# Patient Record
Sex: Male | Born: 1965
Health system: Southern US, Community
[De-identification: ages and names within clinical notes are randomized; demographics above are authoritative.]

## PROBLEM LIST (undated history)

## (undated) DIAGNOSIS — I10 Essential (primary) hypertension: Secondary | ICD-10-CM

## (undated) DIAGNOSIS — G4730 Sleep apnea, unspecified: Secondary | ICD-10-CM

## (undated) DIAGNOSIS — E039 Hypothyroidism, unspecified: Secondary | ICD-10-CM

## (undated) DIAGNOSIS — M1990 Unspecified osteoarthritis, unspecified site: Secondary | ICD-10-CM

## (undated) DIAGNOSIS — K5730 Diverticulosis of large intestine without perforation or abscess without bleeding: Secondary | ICD-10-CM

## (undated) DIAGNOSIS — R7303 Prediabetes: Secondary | ICD-10-CM

## (undated) DIAGNOSIS — Z9889 Other specified postprocedural states: Secondary | ICD-10-CM

## (undated) DIAGNOSIS — E785 Hyperlipidemia, unspecified: Secondary | ICD-10-CM

## (undated) HISTORY — PX: TONSILLECTOMY: SUR1361

## (undated) HISTORY — DX: Hyperlipidemia, unspecified: E78.5

## (undated) HISTORY — PX: BACK SURGERY: SHX140

---

## 2005-08-22 ENCOUNTER — Ambulatory Visit: Payer: Self-pay | Admitting: Family Medicine

## 2005-11-13 ENCOUNTER — Ambulatory Visit: Payer: Self-pay | Admitting: Family Medicine

## 2005-12-06 ENCOUNTER — Ambulatory Visit: Payer: Self-pay | Admitting: Family Medicine

## 2006-01-22 ENCOUNTER — Inpatient Hospital Stay (HOSPITAL_COMMUNITY): Admission: AD | Admit: 2006-01-22 | Discharge: 2006-01-27 | Payer: Self-pay | Admitting: Neurosurgery

## 2006-11-12 ENCOUNTER — Ambulatory Visit: Payer: Self-pay | Admitting: Family Medicine

## 2007-04-02 ENCOUNTER — Ambulatory Visit: Payer: Self-pay | Admitting: Family Medicine

## 2007-04-22 ENCOUNTER — Ambulatory Visit: Payer: Self-pay | Admitting: Family Medicine

## 2007-05-28 ENCOUNTER — Ambulatory Visit: Payer: Self-pay | Admitting: Family Medicine

## 2010-07-12 ENCOUNTER — Ambulatory Visit: Payer: Self-pay | Admitting: Diagnostic Radiology

## 2010-07-12 ENCOUNTER — Emergency Department (HOSPITAL_BASED_OUTPATIENT_CLINIC_OR_DEPARTMENT_OTHER): Admission: EM | Admit: 2010-07-12 | Discharge: 2010-07-12 | Payer: Self-pay | Admitting: Emergency Medicine

## 2011-03-04 LAB — D-DIMER, QUANTITATIVE (NOT AT ARMC): D-Dimer, Quant: <0.22 ug/mL-FEU (ref 0.00–0.48)

## 2011-03-04 LAB — DIFFERENTIAL
Basophils Absolute: 0.4 10*3/uL — ABNORMAL HIGH (ref 0.0–0.1)
Basophils Relative: 3 % — ABNORMAL HIGH (ref 0–1)
Eosinophils Absolute: 0.2 10*3/uL (ref 0.0–0.7)
Eosinophils Relative: 1 % (ref 0–5)
Lymphocytes Relative: 12 % (ref 12–46)
Lymphs Abs: 1.6 10*3/uL (ref 0.7–4.0)
Monocytes Absolute: 1 10*3/uL (ref 0.1–1.0)
Monocytes Relative: 7 % (ref 3–12)
Neutro Abs: 10.8 10*3/uL — ABNORMAL HIGH (ref 1.7–7.7)
Neutrophils Relative %: 77 % (ref 43–77)

## 2011-03-04 LAB — POCT CARDIAC MARKERS
CKMB, poc: <1 ng/mL — ABNORMAL LOW (ref 1.0–8.0)
Myoglobin, poc: 79 ng/mL (ref 12–200)
Troponin i, poc: <0.05 ng/mL (ref 0.00–0.09)

## 2011-03-04 LAB — BASIC METABOLIC PANEL
BUN: 13 mg/dL (ref 6–23)
CO2: 32 mEq/L (ref 19–32)
Calcium: 9.3 mg/dL (ref 8.4–10.5)
Chloride: 98 mEq/L (ref 96–112)
Creatinine, Ser: 1.4 mg/dL (ref 0.4–1.5)
GFR calc Af Amer: >60 mL/min (ref >60)
GFR calc non Af Amer: 56 mL/min — ABNORMAL LOW (ref >60)
Glucose, Bld: 72 mg/dL (ref 70–99)
Potassium: 4 mEq/L (ref 3.5–5.1)
Sodium: 141 mEq/L (ref 135–145)

## 2011-03-04 LAB — CBC
HCT: 53.6 % — ABNORMAL HIGH (ref 39.0–52.0)
Hemoglobin: 17.8 g/dL — ABNORMAL HIGH (ref 13.0–17.0)
MCH: 29.2 pg (ref 26.0–34.0)
MCHC: 33.2 g/dL (ref 30.0–36.0)
MCV: 87.9 fL (ref 78.0–100.0)
Platelets: 241 10*3/uL (ref 150–400)
RBC: 6.09 MIL/uL — ABNORMAL HIGH (ref 4.22–5.81)
RDW: 13.7 % (ref 11.5–15.5)
WBC: 13.9 10*3/uL — ABNORMAL HIGH (ref 4.0–10.5)

## 2011-05-05 NOTE — H&P (Signed)
James [name], OSORTO NO.:  [id number]   MEDICAL RECORD NO.:  [id number]          PATIENT TYPE:  INP   LOCATION:  3009                         FACILITY:  [facility]   PHYSICIAN:  Hewitt Shorts, M.D.DATE OF BIRTH:  22-Oct-1966   DATE OF ADMISSION:  01/22/2006  DATE OF DISCHARGE:                                HISTORY & PHYSICAL   HISTORY OF PRESENT ILLNESS:  Patient is a 45 year old left-handed white male  who is seen regarding left lumbar radiculopathy related to his L5-S1 lumbar  disk herniation.  Patient difficulties with his back began about five or six  years ago with what felt like a catch in his back.  It lasted about a day.  He then had occasional sharp pains from his back down to his left testicle.  The pain at this point developed about two and a half months ago with pain  from the low back extending into the left buttock, posterior left thigh with  numbness and tingling in the left leg and foot.  Pain has been worsening  into the low back and left lower extremity.   Patient was treated with a prednisone dose pack.  He has actually been  treated with a total of three steroid dose packs.  Each times the symptoms  improved but as the dose pack tapers and discontinues the pain recurs.  He  has also been treated with Darvocet and Skelaxin.  Because of persistent  symptoms MRI scan was obtained and neurosurgery consultation requested.  The  MRI scan revealed multilevel degenerative disk disease and spondylosis;  however, the most significant finding was that of a large L5-S1 lumbar disk  herniation with significant thecal sac and left S1 nerve root compression.   The patient is admitted now for a left L5-S1 laminotomy and microdiskectomy.   PAST MEDICAL HISTORY:  Notable for history of sleep apnea and hypertension  both resolved, though, with 50 pounds of weight loss from 2005 to 2006.  He  does not describe any history of myocardial infarction, cancer,  stroke,  peptic ulcer disease, or lung disease.   PAST SURGICAL HISTORY:  Tonsillectomy and ear tubes as a child, but no  recent surgeries as an adult.   He denies allergies to medications.   CURRENT MEDICATIONS:  Darvocet and Skelaxin.   FAMILY HISTORY:  His mother died age 33.  She had COPD.  Father is age 20,  he has Alzheimer's.   SOCIAL HISTORY:  Patient is a Emergency planning/management officer at [employer].  He  is unmarried.  He does not smoke, drink alcoholic beverages, or have a  history of substance abuse.   REVIEW OF SYSTEMS:  Notable for those described in his history of present  illness, past medical history but his 14-point review of systems is  otherwise unremarkable.   PHYSICAL EXAMINATION:  GENERAL:  Patient is a well-developed, well-nourished  white male in no acute distress.  VITAL SIGNS:  Temperature 97.1, pulse 87, blood pressure 138/83, respiratory  rate 16, height 6 feet 1 inch, weight 231 pounds.  LUNGS:  Clear to  auscultation.  He has symmetrical respiratory excursion.  HEART:  Regular rhythm.  Normal S1, S2.  There is no murmur.  ABDOMEN:  Soft, nondistended.  Bowel sounds are present.  EXTREMITIES:  No clubbing, cyanosis, edema.  MUSCULOSKELETAL:  Positive straight leg raising on the left, negative  straight leg raising on the right.  NEUROLOGIC:  5/5 strength to the lower extremities including iliopsoas,  quadriceps, dorsiflexor, extensor hallux longus, and plantar flexor  bilaterally.  Sensation is intact to pin prick to the lower extremities.  Reflexes are 1-2 at the quadriceps, 2 at the gastrocnemii and symmetrical  bilaterally.  Toes are downgoing bilaterally.  He has a normal gait and  stance.   IMPRESSION:  Left lumbar radiculopathy related to a large left L5-S1 disk  herniation with underlying degenerative disk disease with spondylosis.   PLAN:  The patient will be admitted for a left L5-S1 lumbar laminotomy and  microdiskectomy.  We discussed  alternatives to surgery including continued  symptomatic treatment with medications, possibility of considering spinal  injections, and decompression disk surgery.  We discussed the nature of  surgery, alternative to surgery, hospital stay, and overall recuperations,  limitations postoperatively, and risks including risks of infection,  bleeding, possible need for transfusion, the risk of nerve root dysfunction,  pain, weakness, numbness, paraesthesias, the risk of dural tear and CSF  leak, possible need for further surgery, the risk of recurrent disk  herniation and possible need for further surgery, and anesthetic risks and  myocardial infarction, stroke, pneumonia, and death.  After discussing all  of this he would like to go ahead with surgery and would like to go ahead  with such.      Hewitt Shorts, M.D.  Electronically Signed     RWN/MEDQ  D:  01/25/2006  T:  01/25/2006  Job:  [id number]

## 2011-05-05 NOTE — Op Note (Signed)
NAMESIGIFREDO, PIGNATO                ACCOUNT NO.:  [id number]   MEDICAL RECORD NO.:  [id number]          PATIENT TYPE:  OIB   LOCATION:  3172                         FACILITY:  [facility]   PHYSICIAN:  Hewitt Shorts, M.D.DATE OF BIRTH:  November 21, 1966   DATE OF PROCEDURE:  01/22/2006  DATE OF DISCHARGE:                                 OPERATIVE REPORT   PREOPERATIVE DIAGNOSIS:  Left L5-S1 lumbar disk herniation, lumbar  spondylosis, lumbar degenerative disk disease and lumbar radiculopathy.   POSTOPERATIVE DIAGNOSIS:  Left L5-S1 lumbar disk herniation, lumbar  spondylosis, lumbar degenerative disk disease and lumbar radiculopathy.   PROCEDURE:  Left L5-S1 lumbar laminotomy and microdiskectomy with  microdissection.   SURGEON:  Hewitt Shorts, M.D.   ASSISTANT:  Hilda Lias, M.D.   ANESTHESIA:  General orotracheal.   INDICATION:  This is a 45 year old man who presented with a left lumbar  radiculopathy and was found to have a large left L5-S1 lumbar disk  herniation.  Decision was made to proceed with elective laminotomy and  microdiskectomy.   DESCRIPTION OF PROCEDURE:  The patient was brought to the operating room and  placed under general orotracheal anesthesia.  The patient was turned to a  prone position and lumbar region was prepped with Betadine soaping solution  and draped in a sterile fashion.  The midline was infiltrated with a local  anesthetic with epinephrine and x-ray was taken, the L5-S1 level identified  and a midline incision was made over the L5-S1 level and carried down  through the subcutaneous tissue.  Bipolar cautery and electrocautery were  used to maintain hemostasis.  Dissection was carried down to the lumbar  fascia, which was incised on the left side of the midline, and the  paraspinal muscles were dissected from the spinous process and lamina in a  subperiosteal fashion.  The L5-S1 interlaminar space was identified and an x-  ray was taken to  confirm the localization and then the microscope was draped  and brought into the field to provide additional magnification, illumination  and visualization, and the remainder of the decompression was performed  using microdissection and microsurgical technique.  A laminotomy was  performed using the XMax drill and Kerrison punches.  The ligamentum flavum  was carefully removed and thecal sac and nerve roots were identified.  However, it was difficult to mobilize the thecal sac and nerve roots because  of epidural adhesions and as this was done, a small rent occurred in the  lateral aspect of the nerve root and thecal sac and this was repaired with 2  interrupted 6-0 Prolene sutures.  The dissection was continued through the  epidural space and we were eventually able to identify the annulus.  The  annulus was incised and we entered the disk space and proceeded with the  diskectomy using a variety of pituitary rongeurs and microcurettes.  These  was significant spondylitic disk herniation with superimposed acute disk  herniation and much of the spondylitic disk herniation was removed as well  as the acute disk herniation and we were able to decompress the thecal sac  and nerve roots.  In the end, good decompression of the neural structures  was achieved.  All loose fragments of disk material were removed from both  the disk space and the epidural space.  After the diskectomy was completed  and the repair of the dura had been completed, the repair was examined with  a Valsalva maneuver and no CSF leakage was noted.  We then injected Tisseel  over the repair and then proceeded with closure.  The deep fascia was closed  with interrupted undyed 1 Vicryl suture, Scarpa's fascia was closed with  interrupted undyed 1 Vicryl sutures, the subcutaneous and subcuticular were  closed with interrupted and inverted 2-0 undyed Vicryl sutures and the skin  was closed with a running locking 3-0 nylon suture.   The wound was dressed  with Adaptic and sterile gauze.  Following surgery, the patient was turned  to a supine position.  A Foley catheter is to be placed and once that is  placed, the patient will be reversed from the anesthetic, extubated and  transferred to the recovery room for further care with his head of bed flat.      Hewitt Shorts, M.D.  Electronically Signed     RWN/MEDQ  D:  01/22/2006  T:  01/23/2006  Job:  [id number]

## 2011-05-05 NOTE — Discharge Summary (Signed)
NAMESTRAN, RAPER NO.:  [id number]   MEDICAL RECORD NO.:  [id number]          PATIENT TYPE:  INP   LOCATION:  3009                         FACILITY:  [facility]   PHYSICIAN:  Cristi Loron, M.D.DATE OF BIRTH:  16-Dec-1966   DATE OF ADMISSION:  01/22/2006  DATE OF DISCHARGE:  01/27/2006                                 DISCHARGE SUMMARY   BRIEF HISTORY:  The patient is a 45 year old white male who presented with  left lumbar radiculopathy and was found to have a large left L5 herniated  disc.  Decision was made to proceed with elective laminectomy and  microdiskectomy.  For further details of this admission, please refer to the  typed history and physical.   HOSPITAL COURSE:  Dr. Newell Coral admitted the patient on January 22, 2006.  On  the day of admission he performed an L5-S1 microdiskectomy.  The surgery was  complicated by a dural tear which he repaired primarily.  (For further  details of the operation, please refer to the typed operative report).   The patient's postoperative course was unremarkable.  We kept him at flat  bedrest for a few days and then allowed him to get up.  By January 27, 2006  the patient was afebrile, vital signs were stable, he was eating well and  ambulating well and he had no signs of headaches or CSF leaks.  He was  requesting discharge home and he was therefore discharged home on January 27, 2006.   FINAL DIAGNOSES:  1.  L5-S1 herniated nucleus pulposus, lumbar spondylosis, lumbar      degenerative disease, lumbar radiculopathy.  2.  Dural tear.   PROCEDURE PERFORMED:  Left L5-S1 laminectomy and microdiskectomy with  microdissection.   DISCHARGE INSTRUCTIONS:  Patient was given written discharge instructions  and is instructed to follow up with Dr. Newell Coral.   DISCHARGE PRESCRIPTIONS:  1.  Percocet 10/325 #50, one p.o. q.4h. p.r.n. pain.  2.  Flexeril 10 mg #50, one p.o. q.8h. p.r.n. for muscle spasms.      Cristi Loron, M.D.  Electronically Signed     JDJ/MEDQ  D:  03/29/2006  T:  03/29/2006  Job:  [id number]

## 2014-05-20 ENCOUNTER — Emergency Department (HOSPITAL_BASED_OUTPATIENT_CLINIC_OR_DEPARTMENT_OTHER): Payer: BC Managed Care – PPO

## 2014-05-20 ENCOUNTER — Encounter (HOSPITAL_BASED_OUTPATIENT_CLINIC_OR_DEPARTMENT_OTHER): Payer: Self-pay | Admitting: Emergency Medicine

## 2014-05-20 ENCOUNTER — Emergency Department (HOSPITAL_BASED_OUTPATIENT_CLINIC_OR_DEPARTMENT_OTHER)
Admission: EM | Admit: 2014-05-20 | Discharge: 2014-05-20 | Disposition: A | Discharge status: Home / Self Care | Payer: BC Managed Care – PPO | Attending: Emergency Medicine | Admitting: Emergency Medicine

## 2014-05-20 DIAGNOSIS — M5410 Radiculopathy, site unspecified: Secondary | ICD-10-CM

## 2014-05-20 DIAGNOSIS — M542 Cervicalgia: Secondary | ICD-10-CM | POA: Insufficient documentation

## 2014-05-20 DIAGNOSIS — Z9889 Other specified postprocedural states: Secondary | ICD-10-CM | POA: Insufficient documentation

## 2014-05-20 DIAGNOSIS — IMO0002 Reserved for concepts with insufficient information to code with codable children: Secondary | ICD-10-CM | POA: Insufficient documentation

## 2014-05-20 DIAGNOSIS — I10 Essential (primary) hypertension: Secondary | ICD-10-CM | POA: Insufficient documentation

## 2014-05-20 DIAGNOSIS — Z79899 Other long term (current) drug therapy: Secondary | ICD-10-CM | POA: Insufficient documentation

## 2014-05-20 HISTORY — DX: Essential (primary) hypertension: I10

## 2014-05-20 MED ORDER — DEXAMETHASONE SODIUM PHOSPHATE 10 MG/ML IJ SOLN
10.0000 mg | Freq: Once | INTRAMUSCULAR | Status: AC
  Administered 2014-05-20: 10 mg via INTRAMUSCULAR
  Filled 2014-05-20: qty 1

## 2014-05-20 MED ORDER — OXYCODONE-ACETAMINOPHEN 5-325 MG PO TABS: 2.0000 | ORAL_TABLET | ORAL | Status: DC | PRN

## 2014-05-20 NOTE — Discharge Instructions (Signed)

## 2014-05-20 NOTE — ED Notes (Signed)
Patient transported to X-ray 
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
## 2014-05-20 NOTE — ED Notes (Signed)
Pt c/o lower back pain x 1 week which radiates down left leg denies injury

## 2014-05-25 DIAGNOSIS — C4491 Basal cell carcinoma of skin, unspecified: Secondary | ICD-10-CM | POA: Insufficient documentation

## 2014-05-25 DIAGNOSIS — I10 Essential (primary) hypertension: Secondary | ICD-10-CM | POA: Insufficient documentation

## 2014-05-25 DIAGNOSIS — N529 Male erectile dysfunction, unspecified: Secondary | ICD-10-CM | POA: Insufficient documentation

## 2014-05-27 DIAGNOSIS — D751 Secondary polycythemia: Secondary | ICD-10-CM | POA: Insufficient documentation

## 2015-04-01 DIAGNOSIS — G4733 Obstructive sleep apnea (adult) (pediatric): Secondary | ICD-10-CM | POA: Insufficient documentation

## 2015-04-20 DIAGNOSIS — R7303 Prediabetes: Secondary | ICD-10-CM | POA: Insufficient documentation

## 2015-06-14 ENCOUNTER — Encounter: Payer: Self-pay | Admitting: Physician Assistant

## 2015-06-14 ENCOUNTER — Encounter (INDEPENDENT_AMBULATORY_CARE_PROVIDER_SITE_OTHER): Payer: Self-pay

## 2015-06-14 ENCOUNTER — Ambulatory Visit (INDEPENDENT_AMBULATORY_CARE_PROVIDER_SITE_OTHER): Payer: BLUE CROSS/BLUE SHIELD | Admitting: Physician Assistant

## 2015-06-14 VITALS — BP 156/92 | HR 114 | Temp 101.3°F | Ht 73.0 in | Wt 281.0 lb

## 2015-06-14 DIAGNOSIS — R509 Fever, unspecified: Secondary | ICD-10-CM

## 2015-06-14 DIAGNOSIS — R05 Cough: Secondary | ICD-10-CM

## 2015-06-14 DIAGNOSIS — J020 Streptococcal pharyngitis: Secondary | ICD-10-CM

## 2015-06-14 DIAGNOSIS — R059 Cough, unspecified: Secondary | ICD-10-CM

## 2015-06-14 DIAGNOSIS — J111 Influenza due to unidentified influenza virus with other respiratory manifestations: Secondary | ICD-10-CM

## 2015-06-14 LAB — POCT INFLUENZA A/B
Influenza A, POC: POSITIVE — AB
Influenza B, POC: NEGATIVE

## 2015-06-14 LAB — POCT RAPID STREP A (OFFICE): Rapid Strep A Screen: NEGATIVE

## 2015-06-14 MED ORDER — OSELTAMIVIR PHOSPHATE 75 MG PO CAPS: 75.0000 mg | ORAL_CAPSULE | Freq: Two times a day (BID) | ORAL | Status: DC

## 2015-06-14 MED ORDER — BENZONATATE 200 MG PO CAPS: 200.0000 mg | ORAL_CAPSULE | Freq: Three times a day (TID) | ORAL | Status: DC | PRN

## 2015-06-14 NOTE — Progress Notes (Signed)
   Subjective:    Patient ID: James [name], male    DOB: [birth year], 49 y.o.   MRN: [id number]  Influenza This is a new problem. The current episode started in the past 7 days. The problem occurs intermittently. Associated symptoms include chills, congestion, coughing, a fever and headaches.  Cough This is a new problem. The current episode started in the past 7 days. The problem has been gradually worsening. The cough is non-productive. Associated symptoms include chills, a fever, headaches, nasal congestion and postnasal drip. Nothing aggravates the symptoms. He has tried nothing for the symptoms.      Review of Systems  Constitutional: Positive for fever and chills.  HENT: Positive for congestion, postnasal drip and sneezing.   Respiratory: Positive for cough.   Neurological: Positive for headaches.       Objective:   Physical Exam  Constitutional: He is oriented to person, place, and time. He appears well-developed and well-nourished.  HENT:  Head: Normocephalic.  Right Ear: External ear normal.  Left Ear: External ear normal.  Mouth/Throat: Oropharynx is clear and moist. No oropharyngeal exudate.  Cardiovascular: Normal heart sounds.  Exam reveals no gallop and no friction rub.   No murmur heard. Tachycardic    Pulmonary/Chest: Effort normal and breath sounds normal. No respiratory distress. He has no wheezes. He has no rales. He exhibits no tenderness.  Neurological: He is alert and oriented to person, place, and time.  Psychiatric: He has a normal mood and affect. His behavior is normal. Judgment and thought content normal.  Nursing note and vitals reviewed.         Assessment & Plan:  1. Fever and chills  - POCT Influenza A/B  2. Streptococcal sore throat  - POCT rapid strep A - Culture, Group A Strep  3. Influenza  - oseltamivir (TAMIFLU) 75 MG capsule; Take 1 capsule (75 mg total) by mouth 2 (two) times daily.  Dispense: 10 capsule; Refill: 0  4.  Cough  - benzonatate (TESSALON) 200 MG capsule; Take 1 capsule (200 mg total) by mouth 3 (three) times daily as needed for cough.  Dispense: 20 capsule; Refill: 0   Continue all meds Labs pending Health Maintenance reviewed Diet and exercise encouraged RTO 1 week if symptoms do not improve   Kristain Filo A. Benjamin Stain PA-C

## 2015-06-16 LAB — CULTURE, GROUP A STREP

## 2015-08-31 DIAGNOSIS — E291 Testicular hypofunction: Secondary | ICD-10-CM | POA: Insufficient documentation

## 2017-11-13 DIAGNOSIS — E291 Testicular hypofunction: Secondary | ICD-10-CM | POA: Diagnosis not present

## 2017-12-25 DIAGNOSIS — E291 Testicular hypofunction: Secondary | ICD-10-CM | POA: Diagnosis not present

## 2018-01-23 DIAGNOSIS — Z713 Dietary counseling and surveillance: Secondary | ICD-10-CM | POA: Diagnosis not present

## 2018-02-19 DIAGNOSIS — E291 Testicular hypofunction: Secondary | ICD-10-CM | POA: Diagnosis not present

## 2018-02-28 DIAGNOSIS — D751 Secondary polycythemia: Secondary | ICD-10-CM | POA: Diagnosis not present

## 2018-02-28 DIAGNOSIS — E291 Testicular hypofunction: Secondary | ICD-10-CM | POA: Diagnosis not present

## 2018-03-01 DIAGNOSIS — E781 Pure hyperglyceridemia: Secondary | ICD-10-CM | POA: Insufficient documentation

## 2018-03-01 DIAGNOSIS — I10 Essential (primary) hypertension: Secondary | ICD-10-CM | POA: Diagnosis not present

## 2018-03-01 DIAGNOSIS — E039 Hypothyroidism, unspecified: Secondary | ICD-10-CM | POA: Diagnosis not present

## 2018-03-01 DIAGNOSIS — G4733 Obstructive sleep apnea (adult) (pediatric): Secondary | ICD-10-CM | POA: Diagnosis not present

## 2018-03-01 DIAGNOSIS — L918 Other hypertrophic disorders of the skin: Secondary | ICD-10-CM | POA: Diagnosis not present

## 2018-03-28 DIAGNOSIS — G4733 Obstructive sleep apnea (adult) (pediatric): Secondary | ICD-10-CM | POA: Diagnosis not present

## 2018-03-28 DIAGNOSIS — E669 Obesity, unspecified: Secondary | ICD-10-CM | POA: Diagnosis not present

## 2018-04-08 ENCOUNTER — Ambulatory Visit: Payer: BLUE CROSS/BLUE SHIELD | Admitting: Pediatrics

## 2018-04-08 DIAGNOSIS — H66012 Acute suppurative otitis media with spontaneous rupture of ear drum, left ear: Secondary | ICD-10-CM | POA: Diagnosis not present

## 2018-04-08 DIAGNOSIS — J0100 Acute maxillary sinusitis, unspecified: Secondary | ICD-10-CM | POA: Diagnosis not present

## 2018-04-11 DIAGNOSIS — E291 Testicular hypofunction: Secondary | ICD-10-CM | POA: Diagnosis not present

## 2018-04-16 DIAGNOSIS — G4733 Obstructive sleep apnea (adult) (pediatric): Secondary | ICD-10-CM | POA: Diagnosis not present

## 2018-04-19 LAB — HM DIABETES EYE EXAM

## 2018-05-01 DIAGNOSIS — H6123 Impacted cerumen, bilateral: Secondary | ICD-10-CM | POA: Diagnosis not present

## 2018-05-01 DIAGNOSIS — H7292 Unspecified perforation of tympanic membrane, left ear: Secondary | ICD-10-CM | POA: Diagnosis not present

## 2018-05-01 DIAGNOSIS — H66005 Acute suppurative otitis media without spontaneous rupture of ear drum, recurrent, left ear: Secondary | ICD-10-CM | POA: Diagnosis not present

## 2018-05-09 DIAGNOSIS — G4733 Obstructive sleep apnea (adult) (pediatric): Secondary | ICD-10-CM | POA: Diagnosis not present

## 2018-05-21 DIAGNOSIS — Z713 Dietary counseling and surveillance: Secondary | ICD-10-CM | POA: Diagnosis not present

## 2018-06-04 DIAGNOSIS — D751 Secondary polycythemia: Secondary | ICD-10-CM | POA: Diagnosis not present

## 2018-06-04 DIAGNOSIS — E291 Testicular hypofunction: Secondary | ICD-10-CM | POA: Diagnosis not present

## 2018-06-12 DIAGNOSIS — H6505 Acute serous otitis media, recurrent, left ear: Secondary | ICD-10-CM | POA: Diagnosis not present

## 2018-06-12 DIAGNOSIS — H66005 Acute suppurative otitis media without spontaneous rupture of ear drum, recurrent, left ear: Secondary | ICD-10-CM | POA: Diagnosis not present

## 2018-06-12 DIAGNOSIS — J301 Allergic rhinitis due to pollen: Secondary | ICD-10-CM | POA: Diagnosis not present

## 2018-06-12 DIAGNOSIS — Z9109 Other allergy status, other than to drugs and biological substances: Secondary | ICD-10-CM | POA: Insufficient documentation

## 2018-06-21 DIAGNOSIS — E291 Testicular hypofunction: Secondary | ICD-10-CM | POA: Diagnosis not present

## 2018-07-12 DIAGNOSIS — E291 Testicular hypofunction: Secondary | ICD-10-CM | POA: Diagnosis not present

## 2018-08-26 DIAGNOSIS — H6982 Other specified disorders of Eustachian tube, left ear: Secondary | ICD-10-CM | POA: Insufficient documentation

## 2018-08-26 DIAGNOSIS — H6522 Chronic serous otitis media, left ear: Secondary | ICD-10-CM | POA: Diagnosis not present

## 2018-08-26 DIAGNOSIS — H60312 Diffuse otitis externa, left ear: Secondary | ICD-10-CM | POA: Diagnosis not present

## 2018-09-02 DIAGNOSIS — G4733 Obstructive sleep apnea (adult) (pediatric): Secondary | ICD-10-CM | POA: Diagnosis not present

## 2018-09-06 DIAGNOSIS — E291 Testicular hypofunction: Secondary | ICD-10-CM | POA: Diagnosis not present

## 2018-09-12 DIAGNOSIS — E291 Testicular hypofunction: Secondary | ICD-10-CM | POA: Diagnosis not present

## 2018-09-12 DIAGNOSIS — N529 Male erectile dysfunction, unspecified: Secondary | ICD-10-CM | POA: Diagnosis not present

## 2018-10-14 DIAGNOSIS — E291 Testicular hypofunction: Secondary | ICD-10-CM | POA: Diagnosis not present

## 2018-10-22 DIAGNOSIS — Z713 Dietary counseling and surveillance: Secondary | ICD-10-CM | POA: Diagnosis not present

## 2018-10-24 DIAGNOSIS — E291 Testicular hypofunction: Secondary | ICD-10-CM | POA: Diagnosis not present

## 2018-10-25 DIAGNOSIS — Z23 Encounter for immunization: Secondary | ICD-10-CM | POA: Diagnosis not present

## 2018-10-25 DIAGNOSIS — M25561 Pain in right knee: Secondary | ICD-10-CM | POA: Diagnosis not present

## 2018-10-25 DIAGNOSIS — I10 Essential (primary) hypertension: Secondary | ICD-10-CM | POA: Diagnosis not present

## 2018-10-25 DIAGNOSIS — M1711 Unilateral primary osteoarthritis, right knee: Secondary | ICD-10-CM | POA: Diagnosis not present

## 2018-10-25 DIAGNOSIS — E039 Hypothyroidism, unspecified: Secondary | ICD-10-CM | POA: Diagnosis not present

## 2018-10-25 DIAGNOSIS — E782 Mixed hyperlipidemia: Secondary | ICD-10-CM | POA: Diagnosis not present

## 2019-01-22 DIAGNOSIS — Z713 Dietary counseling and surveillance: Secondary | ICD-10-CM | POA: Diagnosis not present

## 2019-02-27 DIAGNOSIS — G4733 Obstructive sleep apnea (adult) (pediatric): Secondary | ICD-10-CM | POA: Diagnosis not present

## 2019-03-04 DIAGNOSIS — K5792 Diverticulitis of intestine, part unspecified, without perforation or abscess without bleeding: Secondary | ICD-10-CM | POA: Diagnosis not present

## 2019-03-04 DIAGNOSIS — R1032 Left lower quadrant pain: Secondary | ICD-10-CM | POA: Diagnosis not present

## 2019-05-28 ENCOUNTER — Encounter: Payer: Self-pay | Admitting: Family Medicine

## 2019-06-02 ENCOUNTER — Ambulatory Visit: Payer: BLUE CROSS/BLUE SHIELD | Admitting: Family Medicine

## 2019-06-04 ENCOUNTER — Other Ambulatory Visit: Payer: Self-pay

## 2019-06-05 ENCOUNTER — Ambulatory Visit: Payer: BLUE CROSS/BLUE SHIELD | Admitting: Family Medicine

## 2019-06-05 ENCOUNTER — Encounter: Payer: Self-pay | Admitting: Family Medicine

## 2019-06-05 VITALS — BP 138/88 | HR 80 | Temp 97.8°F | Ht 73.0 in | Wt 287.0 lb

## 2019-06-05 DIAGNOSIS — Z6837 Body mass index (BMI) 37.0-37.9, adult: Secondary | ICD-10-CM

## 2019-06-05 DIAGNOSIS — E669 Obesity, unspecified: Secondary | ICD-10-CM

## 2019-06-05 DIAGNOSIS — E039 Hypothyroidism, unspecified: Secondary | ICD-10-CM | POA: Diagnosis not present

## 2019-06-05 DIAGNOSIS — Z7689 Persons encountering health services in other specified circumstances: Secondary | ICD-10-CM

## 2019-06-05 DIAGNOSIS — I10 Essential (primary) hypertension: Secondary | ICD-10-CM

## 2019-06-05 MED ORDER — LEVOTHYROXINE SODIUM 50 MCG PO TABS: 50.0000 ug | ORAL_TABLET | Freq: Every day | ORAL | 3 refills | Status: DC

## 2019-06-05 NOTE — Progress Notes (Signed)
Subjective:  Patient ID: James [name], male    DOB: 17-Aug-1966, 53 y.o.   MRN: [id number]  Chief Complaint:  Establish Care   HPI: James [name] is a 53 y.o. male presenting on 06/05/2019 for [place] presents today to establish care. Pt states he has been seeing Suzann Hedgecock in [place] for a while. States he no longer wants to drive that far. Pt states he wanted to get established here in town. Pt states he has been doing fairly well. States he has sleep apnea, low testosterone, hypothyroidism, hypertension, and prediabetes. States he has started a new diet because he was told his blood sugar was elevated. Pt states he has been drinking water and avoiding foods high is sugar and carbohydrates. Pt states he has lost a few pounds since initiating this diet.   Relevant past medical, surgical, family, and social history reviewed and updated as indicated.  Allergies and medications reviewed and updated.   Past Medical History:  Diagnosis Date  . Hyperlipidemia   . Hypertension     Past Surgical History:  Procedure Laterality Date  . BACK SURGERY    . TONSILLECTOMY      Social History   Socioeconomic History  . Marital status: Married    Spouse name: Not on file  . Number of children: Not on file  . Years of education: Not on file  . Highest education level: Not on file  Occupational History  . Not on file  Social Needs  . Financial resource strain: Not on file  . Food insecurity    Worry: Not on file    Inability: Not on file  . Transportation needs    Medical: Not on file    Non-medical: Not on file  Tobacco Use  . Smoking status: Never Smoker  . Smokeless tobacco: Never Used  Substance and Sexual Activity  . Alcohol use: No  . Drug use: No  . Sexual activity: Yes    Birth control/protection: None  Lifestyle  . Physical activity    Days per week: Not on file    Minutes per session: Not on file  . Stress: Not on file  Relationships  . Social  Herbalist on phone: Not on file    Gets together: Not on file    Attends religious service: Not on file    Active member of club or organization: Not on file    Attends meetings of clubs or organizations: Not on file    Relationship status: Not on file  . Intimate partner violence    Fear of current or ex partner: Not on file    Emotionally abused: Not on file    Physically abused: Not on file    Forced sexual activity: Not on file  Other Topics Concern  . Not on file  Social History Narrative  . Not on file    Outpatient Encounter Medications as of 06/05/2019  Medication Sig  . fenofibrate 160 MG tablet Take 160 mg by mouth daily.  Marland Kitchen losartan-hydrochlorothiazide (HYZAAR) 50-12.5 MG tablet Take 1 tablet by mouth daily.  . metoprolol succinate (TOPROL-XL) 50 MG 24 hr tablet Take 50 mg by mouth daily. Take with or immediately following a meal.  . testosterone cypionate (DEPOTESTOSTERONE CYPIONATE) 200 MG/ML injection Inject 1 ml every week  . therapeutic multivitamin-minerals (THERAGRAN-M) tablet Take by mouth.  . [DISCONTINUED] levothyroxine (SYNTHROID) 25 MCG tablet Take 50 mcg by mouth daily.  Marland Kitchen  levothyroxine (SYNTHROID) 50 MCG tablet Take 1 tablet (50 mcg total) by mouth daily.  . Turmeric 500 MG TABS Take by mouth.  . [DISCONTINUED] allopurinol (ZYLOPRIM) 100 MG tablet TAKE 1 TABLET BY MOUTH ONCE DAILY FOR PREVENTION OF GOUT  . [DISCONTINUED] amLODipine-benazepril (LOTREL) 10-20 MG per capsule Take 1 capsule by mouth daily.  . [DISCONTINUED] benzonatate (TESSALON) 200 MG capsule Take 1 capsule (200 mg total) by mouth 3 (three) times daily as needed for cough.  . [DISCONTINUED] metoprolol succinate (TOPROL XL) 25 MG 24 hr tablet Take 25 mg by mouth.  . [DISCONTINUED] oseltamivir (TAMIFLU) 75 MG capsule Take 1 capsule (75 mg total) by mouth 2 (two) times daily.   No facility-administered encounter medications on file as of 06/05/2019.     No Known Allergies  Review of  Systems  Constitutional: Negative for activity change, appetite change, chills, fatigue, fever and unexpected weight change.  Eyes: Negative for photophobia and visual disturbance.  Respiratory: Negative for choking, chest tightness and shortness of breath.   Cardiovascular: Negative for chest pain, palpitations and leg swelling.  Gastrointestinal: Negative for abdominal pain, anal bleeding, blood in stool, diarrhea, nausea and vomiting.  Endocrine: Negative for polydipsia, polyphagia and polyuria.  Genitourinary: Negative for decreased urine volume and difficulty urinating.  Musculoskeletal: Negative for arthralgias and myalgias.  Neurological: Negative for dizziness, tremors, seizures, syncope, facial asymmetry, speech difficulty, weakness, light-headedness, numbness and headaches.  Psychiatric/Behavioral: Negative for confusion.  All other systems reviewed and are negative.       Objective:  BP 138/88   Pulse 80   Temp 97.8 F (36.6 C) (Oral)   Ht 6\' 1"  (1.854 m)   Wt 287 lb (130.2 kg)   BMI 37.87 kg/m    Wt Readings from Last 3 Encounters:  06/05/19 287 lb (130.2 kg)  06/14/15 281 lb (127.5 kg)  05/20/14 260 lb (117.9 kg)    Physical Exam Vitals signs and nursing note reviewed.  Constitutional:      General: He is not in acute distress.    Appearance: Normal appearance. He is well-developed and well-groomed. He is obese. He is not ill-appearing, toxic-appearing or diaphoretic.  HENT:     Head: Normocephalic and atraumatic.     Jaw: There is normal jaw occlusion.     Right Ear: Hearing, tympanic membrane, ear canal and external ear normal.     Left Ear: Hearing, ear canal and external ear normal. No drainage. Tympanic membrane is not perforated, erythematous or bulging.     Ears:     Comments: Scaring to left TM from PE tube. Tube no longer in place on in ear canal.    Nose: Nose normal.     Mouth/Throat:     Lips: Pink.     Mouth: Mucous membranes are moist.      Pharynx: Oropharynx is clear. Uvula midline.  Eyes:     General: Lids are normal.     Extraocular Movements: Extraocular movements intact.     Conjunctiva/sclera: Conjunctivae normal.     Pupils: Pupils are equal, round, and reactive to light.  Neck:     Musculoskeletal: Normal range of motion and neck supple.     Thyroid: No thyroid mass, thyromegaly or thyroid tenderness.     Vascular: No carotid bruit or JVD.     Trachea: Trachea and phonation normal.  Cardiovascular:     Rate and Rhythm: Normal rate and regular rhythm.     Chest Wall: PMI is not displaced.  Pulses: Normal pulses.     Heart sounds: Normal heart sounds. No murmur. No friction rub. No gallop.   Pulmonary:     Effort: Pulmonary effort is normal. No respiratory distress.     Breath sounds: Normal breath sounds. No wheezing.  Abdominal:     General: Bowel sounds are normal. There is no distension or abdominal bruit.     Palpations: Abdomen is soft. There is no hepatomegaly or splenomegaly.     Tenderness: There is no abdominal tenderness. There is no right CVA tenderness or left CVA tenderness.     Hernia: No hernia is present.  Musculoskeletal: Normal range of motion.     Right lower leg: No edema.     Left lower leg: No edema.  Lymphadenopathy:     Cervical: No cervical adenopathy.  Skin:    General: Skin is warm and dry.     Capillary Refill: Capillary refill takes less than 2 seconds.     Coloration: Skin is not cyanotic, jaundiced or pale.     Findings: No rash.  Neurological:     General: No focal deficit present.     Mental Status: He is alert and oriented to person, place, and time.     Cranial Nerves: Cranial nerves are intact.     Sensory: Sensation is intact.     Motor: Motor function is intact.     Coordination: Coordination is intact.     Gait: Gait is intact.     Deep Tendon Reflexes: Reflexes are normal and symmetric.  Psychiatric:        Attention and Perception: Attention and perception  normal.        Mood and Affect: Mood and affect normal.        Speech: Speech normal.        Behavior: Behavior normal. Behavior is cooperative.        Thought Content: Thought content normal.        Cognition and Memory: Cognition and memory normal.        Judgment: Judgment normal.     Results for orders placed or performed in visit on 06/14/15  Culture, Group A Strep   Specimen: Throat   THROAT  Result Value Ref Range   Strep A Culture Comment (A)   POCT rapid strep A  Result Value Ref Range   Rapid Strep A Screen Negative Negative  POCT Influenza A/B  Result Value Ref Range   Influenza A, POC Positive (A) Negative   Influenza B, POC Negative Negative       Pertinent labs & imaging results that were available during my care of the patient were reviewed by me and considered in my medical decision making.  Assessment & Plan:  James [name] was seen today for establish care.  Diagnoses and all orders for this visit:  Encounter to establish care Here to establish care. Overall doing well. Records release forms signed today for colonoscopy and urology. Pt had a physical at work with labs within the last few weeks, will get copy of lab work and bring to office to place in chart.   Acquired hypothyroidism Ongoing for years. Has not needed a change in repletion in several years. States TSH was checked during work physical. Will review and change medication if warranted.  -     levothyroxine (SYNTHROID) 50 MCG tablet; Take 1 tablet (50 mcg total) by mouth daily.  Benign essential hypertension Well controlled on current medications. No refills needed today. Will  get a copy of recent labs and bring to office.     Continue all other maintenance medications.  Follow up plan: Return in about 3 months (around 09/05/2019), or if symptoms worsen or fail to improve, for HTN, Thyroid.    The above assessment and management plan was discussed with the patient. The patient verbalized  understanding of and has agreed to the management plan. Patient is aware to call the clinic if symptoms persist or worsen. Patient is aware when to return to the clinic for a follow-up visit. Patient educated on when it is appropriate to go to the emergency department.   Monia Pouch, FNP-C [facility] [phone]

## 2019-06-11 DIAGNOSIS — E291 Testicular hypofunction: Secondary | ICD-10-CM | POA: Diagnosis not present

## 2019-06-19 DIAGNOSIS — D751 Secondary polycythemia: Secondary | ICD-10-CM | POA: Diagnosis not present

## 2019-06-19 DIAGNOSIS — E291 Testicular hypofunction: Secondary | ICD-10-CM | POA: Diagnosis not present

## 2019-06-19 DIAGNOSIS — N529 Male erectile dysfunction, unspecified: Secondary | ICD-10-CM | POA: Diagnosis not present

## 2019-06-26 ENCOUNTER — Encounter: Payer: Self-pay | Admitting: *Deleted

## 2019-06-26 DIAGNOSIS — Z713 Dietary counseling and surveillance: Secondary | ICD-10-CM | POA: Diagnosis not present

## 2019-07-08 ENCOUNTER — Other Ambulatory Visit: Payer: Self-pay | Admitting: Family Medicine

## 2019-07-24 ENCOUNTER — Other Ambulatory Visit: Payer: Self-pay | Admitting: Family Medicine

## 2019-07-28 ENCOUNTER — Other Ambulatory Visit: Payer: Self-pay | Admitting: Family Medicine

## 2019-09-04 DIAGNOSIS — G4733 Obstructive sleep apnea (adult) (pediatric): Secondary | ICD-10-CM | POA: Diagnosis not present

## 2019-09-08 ENCOUNTER — Other Ambulatory Visit: Payer: Self-pay | Admitting: Family Medicine

## 2019-09-09 ENCOUNTER — Ambulatory Visit: Payer: BC Managed Care – PPO | Admitting: Family Medicine

## 2019-09-10 ENCOUNTER — Ambulatory Visit: Payer: BC Managed Care – PPO | Admitting: Family Medicine

## 2019-10-01 ENCOUNTER — Ambulatory Visit: Payer: BC Managed Care – PPO | Admitting: Family Medicine

## 2019-10-01 ENCOUNTER — Encounter: Payer: Self-pay | Admitting: Family Medicine

## 2019-10-01 ENCOUNTER — Other Ambulatory Visit: Payer: Self-pay

## 2019-10-01 VITALS — BP 113/71 | HR 85 | Temp 98.9°F | Resp 20 | Ht 73.0 in | Wt 284.0 lb

## 2019-10-01 DIAGNOSIS — I10 Essential (primary) hypertension: Secondary | ICD-10-CM | POA: Diagnosis not present

## 2019-10-01 DIAGNOSIS — N289 Disorder of kidney and ureter, unspecified: Secondary | ICD-10-CM

## 2019-10-01 DIAGNOSIS — E039 Hypothyroidism, unspecified: Secondary | ICD-10-CM | POA: Diagnosis not present

## 2019-10-01 DIAGNOSIS — R7309 Other abnormal glucose: Secondary | ICD-10-CM | POA: Insufficient documentation

## 2019-10-01 NOTE — Addendum Note (Signed)
Addended by: Zannie Cove on: 10/01/2019 11:29 AM   Modules accepted: Orders

## 2019-10-01 NOTE — Patient Instructions (Signed)
The 4 Principles of Healthy Living  1. Do Not Smoke.  2. Maintain a BMI<30.  3. Exercise 150 minutes/week. (30 minutes 5 days a week of some form of aerobic exercise)  4. Eat 5 servings of fruits or vegetables daily.   Several studies have conclusively shown that individuals who do these things have a dramatic reduction in overall mortality, heart disease, diabetes, hypertension, stroke, congestive heart failure, and cancer. This means that without taking any pills, vitamins, tonics, etc - without spending a single penny - you can live a longer, healthier, more productive life. Let's look at these 4 principles separately.   1. Do Not Smoke - Make up your mind to quit, talk with your doctor to formulate a plan, and set a date.  2. Get to and maintain a BMI<30. This gets you out of the obese category. No longer being obese will dramatically reduce you and your family's risk of a multitude of health problems. It is not easy, but, it is possible. If you are extremely obese it may take years, but, each step you take will lead to dramatic rewards. With just 20 pounds of weight loss most people feel better, have less fatigue and joint pain, and feel more energetic. If you have health problems like diabetes, hypertension, or high cholesterol you might do away with your need for some medications. And most of all, while you change you lifestyle to attain this goal you will set a good example for all those around you, especially your children. Here are two proven steps to help you start losing weight. ? Portion Control - Eating your meals on a smaller plate and limiting the amount of calories you eat at each meal has been shown to lead to weight loss. The average plate is 10 inches in diameter. A 10 inch plate piled high with food can add up to 1500 calories (even more if you go back for seconds). The typical man needs 2000 calories per day total. Try using a smaller plate (8 inch paper plate or 7 inch saucer)  at each meal and NEVER go back for second helpings. ? Pedometer - individuals who wear a pedometer and try to walk 10,000 steps each day increase their physical activity, lose weight, and decrease their blood pressures.  3. Exercise 150 minutes/week. The overall health benefits of regular aerobic exercise are overwhelming:  Reduces the risk of dying prematurely. Reduces the risk of dying from heart disease.  Reduces the risk of stroke.  Reduces the risk of developing diabetes.  Reduces the risk of developing high blood pressure.  Helps reduce blood pressure in people who already have high blood pressure.  Reduces the risk of developing colon cancer.  Reduces feelings of depression and anxiety.  Helps control weight.  Helps build and maintain healthy bones, muscles and joints.  Helps older adults become stronger and better able to move about without falling.  Promotes psychological well-being.  If you have health problems or are over age 60 we recommend consulting your doctor before beginning. We recommend starting slow and working up. Start by reading the [employer] on Starting an Exercise Program then get going. Do not over think the process. Pick something simple at home like walking with friends, using a treadmill, or riding an exercise bike. Experiment with different types of exercise until you find something you can tolerate (it does not have to be fun). Pick a 30 minute disc of inspirational music and listen to it while you exercise.   The more you do it the easier it will become and the better you will feel.  4. Eat 5 servings of fruits or vegetables daily.  A serving size is: One medium-size fruit  1/2 cup raw, cooked, frozen or canned fruits (in 100% juice) or vegetables  3/4 cup (6 oz.) 100% fruit or vegetable juice  1 cup raw, leafy vegetables  1/4 cup dried fruit   While this sounds easy enough actually getting this much fruits and vegetables takes some work and planning. You  will need to experiment with different types of fruits and vegetables to find ones you and your family can eat every day. Some simple tips include:  - Add fruit to your cereal each morning. - Eat a salad each day for lunch. The typical bowl of salad counts for 2 servings of vegetables.  - Have some fruits and vegetables at every meal. Use canned or frozen products if needed. - Eat fruits and vegetables for snacks - especially for the kids. - Replace the side of fries or chips with a cup of fruit, an apple, or a bowl of celery.  What are the benefits? Reduces heart disease and stroke. Possible reduction in cancer risk. Protects against the development of diabetes. Filling up on fat free fruits and vegetable decreases the amount of high fat foods you will eat, aiding in weight loss.  

## 2019-10-01 NOTE — Progress Notes (Signed)
Subjective:  Patient ID: James [name], male    DOB: May 18, 1966, 53 y.o.   MRN: [id number]  Patient Care Team: Baruch Gouty, FNP as PCP - General (Family Medicine)   Chief Complaint:  Medical Management of Chronic Issues (3 mo ), Hypertension, and Hypothyroidism   HPI: James [name] is a 53 y.o. male presenting on 10/01/2019 for Medical Management of Chronic Issues (3 mo ), Hypertension, and Hypothyroidism   James [name] is a 52 yo male who presents for a 3 month follow up for HTN, hypothyroidism, and obesity. He has been trying to improve his diet by watching his carb, fat, and salt intake.He does eat fruits, vegetables, and lean meats. He has also started [employer] supplements. He goes to the gym 5 or more days a week and does a combination of cardio and weight lifting.     Hypertension This is a chronic problem. The current episode started more than 1 year ago. The problem is controlled. Pertinent negatives include no blurred vision, chest pain, headaches, malaise/fatigue, orthopnea, palpitations, peripheral edema or shortness of breath. Risk factors for coronary artery disease include dyslipidemia, male gender and obesity. Past treatments include angiotensin blockers and diuretics. Compliance problems include diet.  Identifiable causes of hypertension include a thyroid problem.  Thyroid Problem Presents for follow-up visit. Symptoms include dry skin (unchanged). Patient reports no anxiety, cold intolerance, constipation, depressed mood, diaphoresis, diarrhea, fatigue, hair loss, heat intolerance, hoarse voice, leg swelling, nail problem, palpitations, tremors or visual change. The symptoms have been stable.    Relevant past medical, surgical, family, and social history reviewed and updated as indicated.  Allergies and medications reviewed and updated. Date reviewed: Chart in Epic.   Past Medical History:  Diagnosis Date  . Hyperlipidemia   . Hypertension     Past Surgical  History:  Procedure Laterality Date  . BACK SURGERY    . TONSILLECTOMY      Social History   Socioeconomic History  . Marital status: Married    Spouse name: Not on file  . Number of children: Not on file  . Years of education: Not on file  . Highest education level: Not on file  Occupational History  . Not on file  Social Needs  . Financial resource strain: Not on file  . Food insecurity    Worry: Not on file    Inability: Not on file  . Transportation needs    Medical: Not on file    Non-medical: Not on file  Tobacco Use  . Smoking status: Never Smoker  . Smokeless tobacco: Never Used  Substance and Sexual Activity  . Alcohol use: No  . Drug use: No  . Sexual activity: Yes    Birth control/protection: None  Lifestyle  . Physical activity    Days per week: Not on file    Minutes per session: Not on file  . Stress: Not on file  Relationships  . Social Herbalist on phone: Not on file    Gets together: Not on file    Attends religious service: Not on file    Active member of club or organization: Not on file    Attends meetings of clubs or organizations: Not on file    Relationship status: Not on file  . Intimate partner violence    Fear of current or ex partner: Not on file    Emotionally abused: Not on file    Physically abused: Not on file  Forced sexual activity: Not on file  Other Topics Concern  . Not on file  Social History Narrative  . Not on file    Outpatient Encounter Medications as of 10/01/2019  Medication Sig  . cyanocobalamin 100 MCG tablet Take 100 mcg by mouth daily.  . fenofibrate 160 MG tablet TAKE ONE (1) TABLET EACH DAY  . levothyroxine (SYNTHROID) 50 MCG tablet Take 1 tablet (50 mcg total) by mouth daily.  Marland Kitchen losartan-hydrochlorothiazide (HYZAAR) 50-12.5 MG tablet TAKE ONE (1) TABLET EACH DAY  . metoprolol succinate (TOPROL-XL) 50 MG 24 hr tablet TAKE ONE TABLET BY MOUTH TWICE DAILY  . testosterone cypionate  (DEPOTESTOSTERONE CYPIONATE) 200 MG/ML injection Inject 1 ml every week  . therapeutic multivitamin-minerals (THERAGRAN-M) tablet Take by mouth.  . Turmeric 500 MG TABS Take by mouth.   No facility-administered encounter medications on file as of 10/01/2019.     No Known Allergies  Review of Systems  Constitutional: Negative for activity change, appetite change, diaphoresis, fatigue, fever, malaise/fatigue and unexpected weight change.  HENT: Negative for congestion, hoarse voice, sneezing, sore throat and voice change.   Eyes: Negative for blurred vision and visual disturbance.  Respiratory: Negative for cough and shortness of breath.   Cardiovascular: Negative for chest pain, palpitations, orthopnea and leg swelling.  Gastrointestinal: Negative for abdominal pain, blood in stool, constipation, diarrhea, nausea and vomiting.  Endocrine: Negative for cold intolerance and heat intolerance.  Genitourinary: Negative for decreased urine volume and difficulty urinating.  Musculoskeletal: Negative for arthralgias and myalgias.  Skin: Negative for color change and rash.  Neurological: Negative for dizziness, tremors, weakness, light-headedness, numbness and headaches.  Psychiatric/Behavioral: Negative for dysphoric mood. The patient is not nervous/anxious.         Objective:  BP 113/71   Pulse 85   Temp 98.9 F (37.2 C)   Resp 20   Ht _0  (1.854 m)   Wt 284 lb (128.8 kg)   SpO2 97%   BMI 37.47 kg/m    Wt Readings from Last 3 Encounters:  10/01/19 284 lb (128.8 kg)  06/05/19 287 lb (130.2 kg)  06/14/15 281 lb (127.5 kg)    Physical Exam Vitals signs and nursing note reviewed.  Constitutional:      General: He is not in acute distress.    Appearance: He is obese. He is not ill-appearing, toxic-appearing or diaphoretic.  HENT:     Head: Normocephalic and atraumatic.     Right Ear: Tympanic membrane, ear canal and external ear normal.     Left Ear: Ear canal and external  ear normal. Tympanic membrane is scarred (previous TM rupture ). Tympanic membrane is not perforated, erythematous, retracted or bulging.     Nose: Nose normal. No congestion.     Mouth/Throat:     Mouth: Mucous membranes are moist.     Pharynx: Oropharynx is clear.  Eyes:     Extraocular Movements: Extraocular movements intact.     Pupils: Pupils are equal, round, and reactive to light.  Neck:     Musculoskeletal: Normal range of motion and neck supple. No muscular tenderness.  Cardiovascular:     Rate and Rhythm: Normal rate and regular rhythm.     Pulses: Normal pulses.     Heart sounds: No murmur.  Pulmonary:     Effort: Pulmonary effort is normal.     Breath sounds: Normal breath sounds.  Abdominal:     General: Bowel sounds are normal.     Palpations: Abdomen is  soft.  Musculoskeletal:     Right lower leg: No edema.     Left lower leg: No edema.  Skin:    General: Skin is warm and dry.     Capillary Refill: Capillary refill takes less than 2 seconds.  Neurological:     Mental Status: He is alert and oriented to person, place, and time. Mental status is at baseline.     Motor: No weakness.  Psychiatric:        Mood and Affect: Mood normal.        Behavior: Behavior normal.        Thought Content: Thought content normal.        Judgment: Judgment normal.     Results for orders placed or performed in visit on 06/14/15  Culture, Group A Strep   Specimen: Throat   THROAT  Result Value Ref Range   Strep A Culture Comment (A)   POCT rapid strep A  Result Value Ref Range   Rapid Strep A Screen Negative Negative  POCT Influenza A/B  Result Value Ref Range   Influenza A, POC Positive (A) Negative   Influenza B, POC Negative Negative       Pertinent labs & imaging results that were available during my care of the patient were reviewed by me and considered in my medical decision making.  Assessment & Plan:  James [name] was seen today for medical management of chronic  issues, hypertension and hypothyroidism.  Diagnoses and all orders for this visit:  Acquired hypothyroidism Thyroid disease has been well controlled. Labs are pending. Adjustments to regimen will be made if warranted. Make sure to take medications on an empty stomach with a full glass of water. Make sure to avoid vitamins or supplements for at least 4 hours before and 4 hours after taking medications. Repeat labs in 3 months if adjustments are made and in 6 months if stable.   -     Thyroid Panel With TSH  Benign essential hypertension BP well controlled. Changes were not made in regimen. Goal BP is 130/80. Pt aware to report any persistent high or low readings. DASH diet and exercise encouraged. Exercise at least 150 minutes per week and increase as tolerated. Goal BMI > 25. Stress management encouraged. Avoid nicotine and tobacco product use. Avoid excessive alcohol and NSAID's. Avoid more than 2000 mg of sodium daily. Medications as prescribed. Follow up as scheduled.  -     CMP14+EGFR -     CBC with Differential/Platelet -     Thyroid Panel With TSH -     Lipid panel  Abnormal kidney function Previous GFR 58, will recheck today. Labs as below. -     CMP14+EGFR  Elevated glucose Previous fasting glucose levels above 110. Significant family history of diabetes. Labs as below. Discussed diet and exercise goals. -     CMP14+EGFR -     Bayer DCA Hb A1c Waived  Morbid obesity ([place]) Labs as below. Discussed diet and exercise goals.  -     CMP14+EGFR -     CBC with Differential/Platelet -     Thyroid Panel With TSH -     Lipid panel -     Bayer DCA Hb A1c Waived  Continue all other maintenance medications.  Follow up plan: Return in about 6 months (around 03/31/2020), or if symptoms worsen or fail to improve, for HTN, Thyroid.  Continue healthy lifestyle choices, including diet (rich in fruits, vegetables, and lean proteins, and  low in salt and simple carbohydrates) and exercise (at  least 30 minutes of moderate physical activity daily).  Educational handout given for health maintenance.   The above assessment and management plan was discussed with the patient. The patient verbalized understanding of and has agreed to the management plan. Patient is aware to call the clinic if they develop any new symptoms or if symptoms persist or worsen. Patient is aware when to return to the clinic for a follow-up visit. Patient educated on when it is appropriate to go to the emergency department.   Marjorie Smolder, RN, FNP student [facility] [phone]  I personally was present during the history, physical exam, and medical decision-making activities of this service and have verified that the service and findings are accurately documented in the nurse practitioner student's note.  Monia Pouch, FNP-C [facility] [address] [place], [place] [address] [phone]

## 2019-10-02 LAB — CMP14+EGFR
ALT: 46 IU/L — ABNORMAL HIGH (ref 0–44)
AST: 36 IU/L (ref 0–40)
Albumin/Globulin Ratio: 1.6 (ref 1.2–2.2)
Albumin: 4.2 g/dL (ref 3.8–4.9)
Alkaline Phosphatase: 56 IU/L (ref 39–117)
BUN/Creatinine Ratio: 11 (ref 9–20)
BUN: 15 mg/dL (ref 6–24)
Bilirubin Total: 0.8 mg/dL (ref 0.0–1.2)
CO2: 26 mmol/L (ref 20–29)
Calcium: 9.7 mg/dL (ref 8.7–10.2)
Chloride: 95 mmol/L — ABNORMAL LOW (ref 96–106)
Creatinine, Ser: 1.33 mg/dL — ABNORMAL HIGH (ref 0.76–1.27)
GFR calc Af Amer: 71 mL/min/{1.73_m2} (ref >59)
GFR calc non Af Amer: 61 mL/min/{1.73_m2} (ref >59)
Globulin, Total: 2.6 g/dL (ref 1.5–4.5)
Glucose: 101 mg/dL — ABNORMAL HIGH (ref 65–99)
Potassium: 4.1 mmol/L (ref 3.5–5.2)
Sodium: 136 mmol/L (ref 134–144)
Total Protein: 6.8 g/dL (ref 6.0–8.5)

## 2019-10-02 LAB — LIPID PANEL
Chol/HDL Ratio: 6.1 ratio — ABNORMAL HIGH (ref 0.0–5.0)
Cholesterol, Total: 115 mg/dL (ref 100–199)
HDL: 19 mg/dL — ABNORMAL LOW (ref >39)
LDL Chol Calc (NIH): 50 mg/dL (ref 0–99)
Triglycerides: 295 mg/dL — ABNORMAL HIGH (ref 0–149)
VLDL Cholesterol Cal: 46 mg/dL — ABNORMAL HIGH (ref 5–40)

## 2019-10-02 LAB — THYROID PANEL WITH TSH
Free Thyroxine Index: 1.3 (ref 1.2–4.9)
T3 Uptake Ratio: 28 % (ref 24–39)
T4, Total: 4.8 ug/dL (ref 4.5–12.0)
TSH: 3.31 u[IU]/mL (ref 0.450–4.500)

## 2019-10-02 LAB — CBC WITH DIFFERENTIAL/PLATELET
Basophils Absolute: 0.1 10*3/uL (ref 0.0–0.2)
Basos: 1 %
EOS (ABSOLUTE): 0.4 10*3/uL (ref 0.0–0.4)
Eos: 4 %
Hematocrit: 55.7 % — ABNORMAL HIGH (ref 37.5–51.0)
Hemoglobin: 18.9 g/dL — ABNORMAL HIGH (ref 13.0–17.7)
Immature Grans (Abs): 0 10*3/uL (ref 0.0–0.1)
Immature Granulocytes: 0 %
Lymphocytes Absolute: 1.9 10*3/uL (ref 0.7–3.1)
Lymphs: 20 %
MCH: 29.9 pg (ref 26.6–33.0)
MCHC: 33.9 g/dL (ref 31.5–35.7)
MCV: 88 fL (ref 79–97)
Monocytes Absolute: 0.8 10*3/uL (ref 0.1–0.9)
Monocytes: 9 %
Neutrophils Absolute: 6.3 10*3/uL (ref 1.4–7.0)
Neutrophils: 66 %
Platelets: 226 10*3/uL (ref 150–450)
RBC: 6.33 x10E6/uL — ABNORMAL HIGH (ref 4.14–5.80)
RDW: 14.1 % (ref 11.6–15.4)
WBC: 9.5 10*3/uL (ref 3.4–10.8)

## 2019-10-02 LAB — HGB A1C W/O EAG: Hgb A1c MFr Bld: 6.5 % — ABNORMAL HIGH (ref 4.8–5.6)

## 2019-10-03 ENCOUNTER — Telehealth: Payer: Self-pay | Admitting: Family Medicine

## 2019-10-03 ENCOUNTER — Encounter: Payer: Self-pay | Admitting: Family Medicine

## 2019-10-03 DIAGNOSIS — E119 Type 2 diabetes mellitus without complications: Secondary | ICD-10-CM | POA: Insufficient documentation

## 2019-10-11 ENCOUNTER — Other Ambulatory Visit: Payer: Self-pay | Admitting: Family Medicine

## 2019-10-17 ENCOUNTER — Other Ambulatory Visit: Payer: Self-pay

## 2019-10-17 ENCOUNTER — Encounter: Payer: Self-pay | Admitting: Family Medicine

## 2019-10-17 ENCOUNTER — Ambulatory Visit: Payer: BC Managed Care – PPO | Admitting: Family Medicine

## 2019-10-17 VITALS — BP 133/70 | HR 86 | Temp 98.6°F | Resp 20 | Ht 73.0 in | Wt 283.0 lb

## 2019-10-17 DIAGNOSIS — E1159 Type 2 diabetes mellitus with other circulatory complications: Secondary | ICD-10-CM

## 2019-10-17 DIAGNOSIS — E119 Type 2 diabetes mellitus without complications: Secondary | ICD-10-CM

## 2019-10-17 DIAGNOSIS — E291 Testicular hypofunction: Secondary | ICD-10-CM

## 2019-10-17 DIAGNOSIS — N289 Disorder of kidney and ureter, unspecified: Secondary | ICD-10-CM | POA: Diagnosis not present

## 2019-10-17 DIAGNOSIS — E1169 Type 2 diabetes mellitus with other specified complication: Secondary | ICD-10-CM | POA: Diagnosis not present

## 2019-10-17 DIAGNOSIS — I10 Essential (primary) hypertension: Secondary | ICD-10-CM

## 2019-10-17 DIAGNOSIS — E785 Hyperlipidemia, unspecified: Secondary | ICD-10-CM

## 2019-10-17 DIAGNOSIS — I152 Hypertension secondary to endocrine disorders: Secondary | ICD-10-CM | POA: Insufficient documentation

## 2019-10-17 MED ORDER — METFORMIN HCL 500 MG PO TABS: 500.0000 mg | ORAL_TABLET | Freq: Every day | ORAL | 3 refills | Status: DC

## 2019-10-17 MED ORDER — ASPIRIN EC 81 MG PO TBEC: 81.0000 mg | DELAYED_RELEASE_TABLET | Freq: Every day | ORAL | 6 refills | Status: AC

## 2019-10-17 MED ORDER — ATORVASTATIN CALCIUM 40 MG PO TABS: 40.0000 mg | ORAL_TABLET | Freq: Every day | ORAL | 3 refills | Status: DC

## 2019-10-17 MED ORDER — FENOFIBRATE 160 MG PO TABS: ORAL_TABLET | ORAL | 2 refills | Status: DC

## 2019-10-17 MED ORDER — BLOOD GLUCOSE METER KIT: PACK | 0 refills | Status: AC

## 2019-10-17 NOTE — Patient Instructions (Addendum)
Continue to monitor your blood sugars as we discussed and record them. Bring the log to your next appointment.  Take your medications as directed.    Goal Blood glucose:    Fasting (before meals) = 80 to 130   Within 2 hours of eating = less than 180   Understanding your Hemoglobin A1c:     Diabetes Mellitus and Nutrition    I think that you would greatly benefit from seeing a nutritionist. If this is something you are interested in, please call Dr Sykes at [phone] to schedule an appointment.   When you have diabetes (diabetes mellitus), it is very important to have healthy eating habits because your blood sugar (glucose) levels are greatly affected by what you eat and drink. Eating healthy foods in the appropriate amounts, at about the same times every day, can help you:  Control your blood glucose.  Lower your risk of heart disease.  Improve your blood pressure.  Reach or maintain a healthy weight.  Every person with diabetes is different, and each person has different needs for a meal plan. Your health care provider may recommend that you work with a diet and nutrition specialist (dietitian) to make a meal plan that is best for you. Your meal plan may vary depending on factors such as:  The calories you need.  The medicines you take.  Your weight.  Your blood glucose, blood pressure, and cholesterol levels.  Your activity level.  Other health conditions you have, such as heart or kidney disease.  How do carbohydrates affect me? Carbohydrates affect your blood glucose level more than any other type of food. Eating carbohydrates naturally increases the amount of glucose in your blood. Carbohydrate counting is a method for keeping track of how many carbohydrates you eat. Counting carbohydrates is important to keep your blood glucose at a healthy level, especially if you use insulin or take certain oral diabetes medicines. It is important to know how many  carbohydrates you can safely have in each meal. This is different for every person. Your dietitian can help you calculate how many carbohydrates you should have at each meal and for snack. Foods that contain carbohydrates include:  Bread, cereal, rice, pasta, and crackers.  Potatoes and corn.  Peas, beans, and lentils.  Milk and yogurt.  Fruit and juice.  Desserts, such as cakes, cookies, ice cream, and candy.  How does alcohol affect me? Alcohol can cause a sudden decrease in blood glucose (hypoglycemia), especially if you use insulin or take certain oral diabetes medicines. Hypoglycemia can be a life-threatening condition. Symptoms of hypoglycemia (sleepiness, dizziness, and confusion) are similar to symptoms of having too much alcohol. If your health care provider says that alcohol is safe for you, follow these guidelines:  Limit alcohol intake to no more than 1 drink per day for nonpregnant women and 2 drinks per day for men. One drink equals 12 oz of beer, 5 oz of wine, or 1 oz of hard liquor.  Do not drink on an empty stomach.  Keep yourself hydrated with water, diet soda, or unsweetened iced tea.  Keep in mind that regular soda, juice, and other mixers may contain a lot of sugar and must be counted as carbohydrates.  What are tips for following this plan?  Reading food labels  Start by checking the serving size on the label. The amount of calories, carbohydrates, fats, and other nutrients listed on the label are based on one serving of the food. Many foods   contain more than one serving per package.  Check the total grams (g) of carbohydrates in one serving. You can calculate the number of servings of carbohydrates in one serving by dividing the total carbohydrates by 15. For example, if a food has 30 g of total carbohydrates, it would be equal to 2 servings of carbohydrates.  Check the number of grams (g) of saturated and trans fats in one serving. Choose foods that have  low or no amount of these fats.  Check the number of milligrams (mg) of sodium in one serving. Most people should limit total sodium intake to less than 2,300 mg per day.  Always check the nutrition information of foods labeled as "low-fat" or "nonfat". These foods may be higher in added sugar or refined carbohydrates and should be avoided.  Talk to your dietitian to identify your daily goals for nutrients listed on the label.  Shopping  Avoid buying canned, premade, or processed foods. These foods tend to be high in fat, sodium, and added sugar.  Shop around the outside edge of the grocery store. This includes fresh fruits and vegetables, bulk grains, fresh meats, and fresh dairy.  Cooking  Use low-heat cooking methods, such as baking, instead of high-heat cooking methods like deep frying.  Cook using healthy oils, such as olive, canola, or sunflower oil.  Avoid cooking with butter, cream, or high-fat meats.  Meal planning  Eat meals and snacks regularly, preferably at the same times every day. Avoid going long periods of time without eating.  Eat foods high in fiber, such as fresh fruits, vegetables, beans, and whole grains. Talk to your dietitian about how many servings of carbohydrates you can eat at each meal.  Eat 4-6 ounces of lean protein each day, such as lean meat, chicken, fish, eggs, or tofu. 1 ounce is equal to 1 ounce of meat, chicken, or fish, 1 egg, or 1/4 cup of tofu.  Eat some foods each day that contain healthy fats, such as avocado, nuts, seeds, and fish.  Lifestyle   Check your blood glucose regularly.  Exercise at least 30 minutes 5 or more days each week, or as told by your health care provider.  Take medicines as told by your health care provider.  Do not use any products that contain nicotine or tobacco, such as cigarettes and e-cigarettes. If you need help quitting, ask your health care provider.  Work with a counselor or diabetes educator to  identify strategies to manage stress and any emotional and social challenges.   What are some questions to ask my health care provider?  Do I need to meet with a diabetes educator?  Do I need to meet with a dietitian?  What number can I call if I have questions?  When are the best times to check my blood glucose?   Where to find more information:  American Diabetes Association: diabetes.org/food-and-fitness/food  Academy of Nutrition and Dietetics: www.eatright.org/resources/health/diseases-and-conditions/diabetes  National Institute of Diabetes and Digestive and Kidney Diseases (NIH): www.niddk.nih.gov/health-information/diabetes/overview/diet-eating-physical-activity   Summary  A healthy meal plan will help you control your blood glucose and maintain a healthy lifestyle.  Working with a diet and nutrition specialist (dietitian) can help you make a meal plan that is best for you.  Keep in mind that carbohydrates and alcohol have immediate effects on your blood glucose levels. It is important to count carbohydrates and to use alcohol carefully. This information is not intended to replace advice given to you by your health care provider.   Make sure you discuss any questions you have with your health care provider. Document Released: 08/31/2005 Document Revised: 01/08/2017 Document Reviewed: 01/08/2017 Elsevier Interactive Patient Education  2018 [employer].      Diabetes Basics  Diabetes (diabetes mellitus) is a long-term (chronic) disease. It occurs when the body does not properly use sugar (glucose) that is released from food after you eat. Diabetes may be caused by one or both of these problems:  Your pancreas does not make enough of a hormone called insulin.  Your body does not react in a normal way to insulin that it makes. Insulin lets sugars (glucose) go into cells in your body. This gives you energy. If you have diabetes, sugars cannot get into cells. This  causes high blood sugar (hyperglycemia). Follow these instructions at home: How is diabetes treated? You may need to take insulin or other diabetes medicines daily to keep your blood sugar in balance. Take your diabetes medicines every day as told by your doctor. List your diabetes medicines here: Diabetes medicines  Name of medicine: ______________________________ ? Amount (dose): _______________ Time (a.m./p.m.): _______________ Notes: ___________________________________  Name of medicine: ______________________________ ? Amount (dose): _______________ Time (a.m./p.m.): _______________ Notes: ___________________________________  Name of medicine: ______________________________ ? Amount (dose): _______________ Time (a.m./p.m.): _______________ Notes: ___________________________________ If you use insulin, you will learn how to give yourself insulin by injection. You may need to adjust the amount based on the food that you eat. List the types of insulin you use here: Insulin  Insulin type: ______________________________ ? Amount (dose): _______________ Time (a.m./p.m.): _______________ Notes: ___________________________________  Insulin type: ______________________________ ? Amount (dose): _______________ Time (a.m./p.m.): _______________ Notes: ___________________________________  Insulin type: ______________________________ ? Amount (dose): _______________ Time (a.m./p.m.): _______________ Notes: ___________________________________  Insulin type: ______________________________ ? Amount (dose): _______________ Time (a.m./p.m.): _______________ Notes: ___________________________________  Insulin type: ______________________________ ? Amount (dose): _______________ Time (a.m./p.m.): _______________ Notes: ___________________________________ How do I manage my blood sugar?  Check your blood sugar levels using a blood glucose monitor as directed by your doctor. Your doctor will set  treatment goals for you. Generally, you should have these blood sugar levels:  Before meals (preprandial): 80-130 mg/dL (4.4-7.2 mmol/L).  After meals (postprandial): below 180 mg/dL (10 mmol/L).  A1c level: less than 7%. Write down the times that you will check your blood sugar levels: Blood sugar checks  Time: _______________ Notes: ___________________________________  Time: _______________ Notes: ___________________________________  Time: _______________ Notes: ___________________________________  Time: _______________ Notes: ___________________________________  Time: _______________ Notes: ___________________________________  Time: _______________ Notes: ___________________________________  What do I need to know about low blood sugar? Low blood sugar is called hypoglycemia. This is when blood sugar is at or below 70 mg/dL (3.9 mmol/L). Symptoms may include:  Feeling: ? Hungry. ? Worried or nervous (anxious). ? Sweaty and clammy. ? Confused. ? Dizzy. ? Sleepy. ? Sick to your stomach (nauseous).  Having: ? A fast heartbeat. ? A headache. ? A change in your vision. ? Tingling or no feeling (numbness) around the mouth, lips, or tongue. ? Jerky movements that you cannot control (seizure).  Having trouble with: ? Moving (coordination). ? Sleeping. ? Passing out (fainting). ? Getting upset easily (irritability). Treating low blood sugar To treat low blood sugar, eat or drink something sugary right away. If you can think clearly and swallow safely, follow the 15:15 rule:  Take 15 grams of a fast-acting carb (carbohydrate). Talk with your doctor about how much you should take.  Some fast-acting carbs are: ? Sugar tablets (glucose pills). Take 3-4  glucose pills. ? 6-8 pieces of hard candy. ? 4-6 oz (120-150 mL) of fruit juice. ? 4-6 oz (120-150 mL) of regular (not diet) soda. ? 1 Tbsp (15 mL) honey or sugar.  Check your blood sugar 15 minutes after you take the  carb.  If your blood sugar is still at or below 70 mg/dL (3.9 mmol/L), take 15 grams of a carb again.  If your blood sugar does not go above 70 mg/dL (3.9 mmol/L) after 3 tries, get help right away.  After your blood sugar goes back to normal, eat a meal or a snack within 1 hour. Treating very low blood sugar If your blood sugar is at or below 54 mg/dL (3 mmol/L), you have very low blood sugar (severe hypoglycemia). This is an emergency. Do not wait to see if the symptoms will go away. Get medical help right away. Call your local emergency services (911 in the [place] Do not drive yourself to the hospital. Questions to ask your health care provider  Do I need to meet with a diabetes educator?  What equipment will I need to care for myself at home?  What diabetes medicines do I need? When should I take them?  How often do I need to check my blood sugar?  What number can I call if I have questions?  When is my next doctor's visit?  Where can I find a support group for people with diabetes? Where to find more information  American Diabetes Association: www.diabetes.org  American Association of Diabetes Educators: www.diabeteseducator.org/patient-resources Contact a doctor if:  Your blood sugar is at or above 240 mg/dL (13.3 mmol/L) for 2 days in a row.  You have been sick or have had a fever for 2 days or more, and you are not getting better.  You have any of these problems for more than 6 hours: ? You cannot eat or drink. ? You feel sick to your stomach (nauseous). ? You throw up (vomit). ? You have watery poop (diarrhea). Get help right away if:  Your blood sugar is lower than 54 mg/dL (3 mmol/L).  You get confused.  You have trouble: ? Thinking clearly. ? Breathing. Summary  Diabetes (diabetes mellitus) is a long-term (chronic) disease. It occurs when the body does not properly use sugar (glucose) that is released from food after digestion.  Take insulin and  diabetes medicines as told.  Check your blood sugar every day, as often as told.  Keep all follow-up visits as told by your doctor. This is important. This information is not intended to replace advice given to you by your health care provider. Make sure you discuss any questions you have with your health care provider. Document Released: 03/08/2018 Document Revised: 01/24/2019 Document Reviewed: 03/08/2018 Elsevier Patient Education  2020 [employer].

## 2019-10-17 NOTE — Progress Notes (Signed)
   Subjective:  Patient ID: James [name], male    DOB: 11/13/1966, 53 y.o.   MRN: [id number]  Patient Care Team: Rakes, Linda M, FNP as PCP - General (Family Medicine)   Chief Complaint:  2 Week Follow-up   HPI: James [name] is a 53 y.o. male presenting on 10/17/2019 for 2 Week Follow-up  1. Type 2 diabetes mellitus without complication, without long-term current use of insulin (HCC) New diagnosis. A1C checked at last visit due to noted consistently elevated fasting glucose levels. A1C returned at 6.5. Pt states he has tried to cut out sodas and sweets since last OV. He works out on a daily basis. Does eat a lot of carbohydrates and proteins for weight training. He denies visual changes, polyuria, polydipsia, or polyphagia. No changes in urine output. No weight loss or gain.   2. Hypertension associated with diabetes (HCC) Compliant with medications. Works out daily. No chest pain, shortness of breath, headaches, confusion, dizziness, or leg swelling.   3. Hyperlipidemia associated with type 2 diabetes mellitus (HCC) Works out daily. Only on fenofibrate. Does eat a lot of protein and carbohydrates.   4. Hypogonadism in male On weekly testosterone injections. Was followed by urology. Would like to have testosterone checked today and transfer management to this provider.   Relevant past medical, surgical, family, and social history reviewed and updated as indicated.  Allergies and medications reviewed and updated. Date reviewed: Chart in Epic.   Past Medical History:  Diagnosis Date  . Hyperlipidemia   . Hypertension     Past Surgical History:  Procedure Laterality Date  . BACK SURGERY    . TONSILLECTOMY      Social History   Socioeconomic History  . Marital status: Married    Spouse name: Not on file  . Number of children: Not on file  . Years of education: Not on file  . Highest education level: Not on file  Occupational History  . Not on file  Social Needs   . Financial resource strain: Not on file  . Food insecurity    Worry: Not on file    Inability: Not on file  . Transportation needs    Medical: Not on file    Non-medical: Not on file  Tobacco Use  . Smoking status: Never Smoker  . Smokeless tobacco: Never Used  Substance and Sexual Activity  . Alcohol use: No  . Drug use: No  . Sexual activity: Yes    Birth control/protection: None  Lifestyle  . Physical activity    Days per week: Not on file    Minutes per session: Not on file  . Stress: Not on file  Relationships  . Social connections    Talks on phone: Not on file    Gets together: Not on file    Attends religious service: Not on file    Active member of club or organization: Not on file    Attends meetings of clubs or organizations: Not on file    Relationship status: Not on file  . Intimate partner violence    Fear of current or ex partner: Not on file    Emotionally abused: Not on file    Physically abused: Not on file    Forced sexual activity: Not on file  Other Topics Concern  . Not on file  Social History Narrative  . Not on file    Outpatient Encounter Medications as of 10/17/2019  Medication Sig  . cyanocobalamin   100 MCG tablet Take 100 mcg by mouth daily.  . fenofibrate 160 MG tablet TAKE ONE (1) TABLET EACH DAY  . levothyroxine (SYNTHROID) 50 MCG tablet Take 1 tablet (50 mcg total) by mouth daily.  . losartan-hydrochlorothiazide (HYZAAR) 50-12.5 MG tablet TAKE ONE (1) TABLET EACH DAY  . metoprolol succinate (TOPROL-XL) 50 MG 24 hr tablet TAKE ONE TABLET BY MOUTH TWICE DAILY  . testosterone cypionate (DEPOTESTOSTERONE CYPIONATE) 200 MG/ML injection Inject 1 ml every week  . therapeutic multivitamin-minerals (THERAGRAN-M) tablet Take by mouth.  . Turmeric 500 MG TABS Take by mouth.  . [DISCONTINUED] fenofibrate 160 MG tablet TAKE ONE (1) TABLET EACH DAY  . aspirin EC 81 MG tablet Take 1 tablet (81 mg total) by mouth daily.  . atorvastatin (LIPITOR) 40  MG tablet Take 1 tablet (40 mg total) by mouth daily.  . blood glucose meter kit and supplies Dispense based on patient and insurance preference. Use up to four times daily as directed. (FOR ICD-10 E10.9, E11.9).  . metFORMIN (GLUCOPHAGE) 500 MG tablet Take 1 tablet (500 mg total) by mouth daily with breakfast.   No facility-administered encounter medications on file as of 10/17/2019.     No Known Allergies  Review of Systems  Constitutional: Negative for activity change, appetite change, chills, diaphoresis, fatigue, fever and unexpected weight change.  HENT: Negative.   Eyes: Negative.  Negative for photophobia and visual disturbance.  Respiratory: Negative for cough, chest tightness and shortness of breath.   Cardiovascular: Negative for chest pain, palpitations and leg swelling.  Gastrointestinal: Negative for abdominal distention, abdominal pain, anal bleeding, blood in stool, constipation, diarrhea, nausea, rectal pain and vomiting.  Endocrine: Negative.  Negative for polydipsia, polyphagia and polyuria.  Genitourinary: Negative for decreased urine volume, difficulty urinating, discharge, dysuria, flank pain, frequency, hematuria, penile pain, penile swelling, scrotal swelling, testicular pain and urgency.  Musculoskeletal: Negative for arthralgias and myalgias.  Skin: Negative.  Negative for color change and pallor.  Allergic/Immunologic: Negative.   Neurological: Negative for dizziness, tremors, seizures, syncope, facial asymmetry, speech difficulty, weakness, light-headedness, numbness and headaches.  Hematological: Negative.   Psychiatric/Behavioral: Negative for confusion, hallucinations, sleep disturbance and suicidal ideas.  All other systems reviewed and are negative.       Objective:  BP 133/70   Pulse 86   Temp 98.6 F (37 C) (Temporal)   Resp 20   Ht 6' 1" (1.854 m)   Wt 283 lb (128.4 kg)   SpO2 94%   BMI 37.34 kg/m    Wt Readings from Last 3 Encounters:   10/17/19 283 lb (128.4 kg)  10/01/19 284 lb (128.8 kg)  06/05/19 287 lb (130.2 kg)    Physical Exam Vitals signs and nursing note reviewed.  Constitutional:      General: He is not in acute distress.    Appearance: Normal appearance. He is well-developed and well-groomed. He is morbidly obese. He is not ill-appearing, toxic-appearing or diaphoretic.  HENT:     Head: Normocephalic and atraumatic.     Jaw: There is normal jaw occlusion.     Right Ear: Hearing normal.     Left Ear: Hearing normal.     Nose: Nose normal.     Mouth/Throat:     Lips: Pink.     Mouth: Mucous membranes are moist.     Pharynx: Oropharynx is clear. Uvula midline.  Eyes:     General: Lids are normal.     Extraocular Movements: Extraocular movements intact.       Conjunctiva/sclera: Conjunctivae normal.     Pupils: Pupils are equal, round, and reactive to light.  Neck:     Musculoskeletal: Normal range of motion and neck supple.     Thyroid: No thyroid mass, thyromegaly or thyroid tenderness.     Vascular: No carotid bruit or JVD.     Trachea: Trachea and phonation normal.  Cardiovascular:     Rate and Rhythm: Normal rate and regular rhythm.     Chest Wall: PMI is not displaced.     Pulses: Normal pulses.     Heart sounds: Normal heart sounds. No murmur. No friction rub. No gallop.   Pulmonary:     Effort: Pulmonary effort is normal. No respiratory distress.     Breath sounds: Normal breath sounds. No wheezing.  Abdominal:     General: Abdomen is protuberant. Bowel sounds are normal. There is no distension or abdominal bruit.     Palpations: Abdomen is soft. There is no hepatomegaly or splenomegaly.     Tenderness: There is no abdominal tenderness. There is no right CVA tenderness or left CVA tenderness.     Hernia: No hernia is present.  Musculoskeletal: Normal range of motion.     Right lower leg: No edema.     Left lower leg: No edema.  Lymphadenopathy:     Cervical: No cervical adenopathy.   Skin:    General: Skin is warm and dry.     Capillary Refill: Capillary refill takes less than 2 seconds.     Coloration: Skin is not cyanotic, jaundiced or pale.     Findings: No rash.  Neurological:     General: No focal deficit present.     Mental Status: He is alert and oriented to person, place, and time.     Cranial Nerves: Cranial nerves are intact. No cranial nerve deficit.     Sensory: Sensation is intact. No sensory deficit.     Motor: Motor function is intact. No weakness.     Coordination: Coordination is intact. Coordination normal.     Gait: Gait is intact. Gait normal.     Deep Tendon Reflexes: Reflexes are normal and symmetric. Reflexes normal.  Psychiatric:        Attention and Perception: Attention and perception normal.        Mood and Affect: Mood and affect normal.        Speech: Speech normal.        Behavior: Behavior normal. Behavior is cooperative.        Thought Content: Thought content normal.        Cognition and Memory: Cognition and memory normal.        Judgment: Judgment normal.     Results for orders placed or performed in visit on 10/01/19  CMP14+EGFR  Result Value Ref Range   Glucose 101 (H) 65 - 99 mg/dL   BUN 15 6 - 24 mg/dL   Creatinine, Ser 1.33 (H) 0.76 - 1.27 mg/dL   GFR calc non Af Amer 61 >59 mL/min/1.73   GFR calc Af Amer 71 >59 mL/min/1.73   BUN/Creatinine Ratio 11 9 - 20   Sodium 136 134 - 144 mmol/L   Potassium 4.1 3.5 - 5.2 mmol/L   Chloride 95 (L) 96 - 106 mmol/L   CO2 26 20 - 29 mmol/L   Calcium 9.7 8.7 - 10.2 mg/dL   Total Protein 6.8 6.0 - 8.5 g/dL   Albumin 4.2 3.8 - 4.9 g/dL   Globulin, Total 2.6  1.5 - 4.5 g/dL   Albumin/Globulin Ratio 1.6 1.2 - 2.2   Bilirubin Total 0.8 0.0 - 1.2 mg/dL   Alkaline Phosphatase 56 39 - 117 IU/L   AST 36 0 - 40 IU/L   ALT 46 (H) 0 - 44 IU/L  CBC with Differential/Platelet  Result Value Ref Range   WBC 9.5 3.4 - 10.8 x10E3/uL   RBC 6.33 (H) 4.14 - 5.80 x10E6/uL   Hemoglobin 18.9 (H)  13.0 - 17.7 g/dL   Hematocrit 55.7 (H) 37.5 - 51.0 %   MCV 88 79 - 97 fL   MCH 29.9 26.6 - 33.0 pg   MCHC 33.9 31.5 - 35.7 g/dL   RDW 14.1 11.6 - 15.4 %   Platelets 226 150 - 450 x10E3/uL   Neutrophils 66 Not Estab. %   Lymphs 20 Not Estab. %   Monocytes 9 Not Estab. %   Eos 4 Not Estab. %   Basos 1 Not Estab. %   Neutrophils Absolute 6.3 1.4 - 7.0 x10E3/uL   Lymphocytes Absolute 1.9 0.7 - 3.1 x10E3/uL   Monocytes Absolute 0.8 0.1 - 0.9 x10E3/uL   EOS (ABSOLUTE) 0.4 0.0 - 0.4 x10E3/uL   Basophils Absolute 0.1 0.0 - 0.2 x10E3/uL   Immature Granulocytes 0 Not Estab. %   Immature Grans (Abs) 0.0 0.0 - 0.1 x10E3/uL  Thyroid Panel With TSH  Result Value Ref Range   TSH 3.310 0.450 - 4.500 uIU/mL   T4, Total 4.8 4.5 - 12.0 ug/dL   T3 Uptake Ratio 28 24 - 39 %   Free Thyroxine Index 1.3 1.2 - 4.9  Lipid panel  Result Value Ref Range   Cholesterol, Total 115 100 - 199 mg/dL   Triglycerides 295 (H) 0 - 149 mg/dL   HDL 19 (L) >39 mg/dL   VLDL Cholesterol Cal 46 (H) 5 - 40 mg/dL   LDL Chol Calc (NIH) 50 0 - 99 mg/dL   Chol/HDL Ratio 6.1 (H) 0.0 - 5.0 ratio  Hgb A1c w/o eAG  Result Value Ref Range   Hgb A1c MFr Bld 6.5 (H) 4.8 - 5.6 %       Pertinent labs & imaging results that were available during my care of the patient were reviewed by me and considered in my medical decision making.  Assessment & Plan:  Ewel was seen today for 2 week follow-up.  Diagnoses and all orders for this visit:  Type 2 diabetes mellitus without complication, without long-term current use of insulin (HCC) Declines referral to diabetic educator. Long discussion about disease process and management today. Pt agrees to trial once daily metformin and to make changes to diet. Pt will check blood sugars twice daily and report any consistent high or low readings. Will add statin and ASA therapy. Medications as prescribed. Follow up in 3 months for reevaluation. Handouts pertaining to diabetes provided to pt  in office today.  -     metFORMIN (GLUCOPHAGE) 500 MG tablet; Take 1 tablet (500 mg total) by mouth daily with breakfast. -     aspirin EC 81 MG tablet; Take 1 tablet (81 mg total) by mouth daily. -     CMP14+EGFR -     blood glucose meter kit and supplies; Dispense based on patient and insurance preference. Use up to four times daily as directed. (FOR ICD-10 E10.9, E11.9).  Hypertension associated with diabetes (HCC) BP well controlled. Changes were not made in regimen today. Goal BP is 130/80. Pt aware to report any persistent   high or low readings. DASH diet and exercise encouraged. Exercise at least 150 minutes per week and increase as tolerated. Goal BMI > 25. Stress management encouraged. Avoid nicotine and tobacco product use. Avoid excessive alcohol and NSAID's. Avoid more than 2000 mg of sodium daily. Medications as prescribed. Follow up as scheduled.  -     aspirin EC 81 MG tablet; Take 1 tablet (81 mg total) by mouth daily. -     CMP14+EGFR  Hyperlipidemia associated with type 2 diabetes mellitus (HCC) Diet encouraged - increase intake of fresh fruits and vegetables, increase intake of lean proteins. Bake, broil, or grill foods. Avoid fried, greasy, and fatty foods. Avoid fast foods. Increase intake of fiber-rich whole grains. Exercise encouraged - at least 150 minutes per week and advance as tolerated.  Goal BMI < 25. Continue medications as prescribed, will add statin. Pt aware to report any myalgias. Follow up in 3-6 months as discussed.  -     atorvastatin (LIPITOR) 40 MG tablet; Take 1 tablet (40 mg total) by mouth daily. -     fenofibrate 160 MG tablet; TAKE ONE (1) TABLET EACH DAY  Hypogonadism in male Will check testosterone today. Pt does not need refills at this time.  -     Testosterone,Free and Total  Abnormal kidney function Has increased water intake. Will recheck labs today.  -     CMP14+EGFR  Total time spent with patient 40 minutes.  Greater than 50% of encounter  spent in coordination of care/counseling.  Continue all other maintenance medications.  Follow up plan: Return in about 3 months (around 01/17/2020), or if symptoms worsen or fail to improve, for DM.  Continue healthy lifestyle choices, including diet (rich in fruits, vegetables, and lean proteins, and low in salt and simple carbohydrates) and exercise (at least 30 minutes of moderate physical activity daily).  Educational handout given for DM  The above assessment and management plan was discussed with the patient. The patient verbalized understanding of and has agreed to the management plan. Patient is aware to call the clinic if they develop any new symptoms or if symptoms persist or worsen. Patient is aware when to return to the clinic for a follow-up visit. Patient educated on when it is appropriate to go to the emergency department.   Michelle Rakes, FNP-C [facility] [phone]   

## 2019-10-19 LAB — CMP14+EGFR
ALT: 39 IU/L (ref 0–44)
AST: 22 IU/L (ref 0–40)
Albumin/Globulin Ratio: 1.5 (ref 1.2–2.2)
Albumin: 4.3 g/dL (ref 3.8–4.9)
Alkaline Phosphatase: 62 IU/L (ref 39–117)
BUN/Creatinine Ratio: 12 (ref 9–20)
BUN: 16 mg/dL (ref 6–24)
Bilirubin Total: 0.6 mg/dL (ref 0.0–1.2)
CO2: 23 mmol/L (ref 20–29)
Calcium: 10 mg/dL (ref 8.7–10.2)
Chloride: 102 mmol/L (ref 96–106)
Creatinine, Ser: 1.35 mg/dL — ABNORMAL HIGH (ref 0.76–1.27)
GFR calc Af Amer: 69 mL/min/{1.73_m2} (ref >59)
GFR calc non Af Amer: 60 mL/min/{1.73_m2} (ref >59)
Globulin, Total: 2.9 g/dL (ref 1.5–4.5)
Glucose: 104 mg/dL — ABNORMAL HIGH (ref 65–99)
Potassium: 4.3 mmol/L (ref 3.5–5.2)
Sodium: 139 mmol/L (ref 134–144)
Total Protein: 7.2 g/dL (ref 6.0–8.5)

## 2019-10-19 LAB — TESTOSTERONE,FREE AND TOTAL
Testosterone, Free: 37.3 pg/mL — ABNORMAL HIGH (ref 7.2–24.0)
Testosterone: 1364 ng/dL — ABNORMAL HIGH (ref 264–916)

## 2019-10-20 ENCOUNTER — Telehealth: Payer: Self-pay | Admitting: Family Medicine

## 2019-10-20 NOTE — Telephone Encounter (Signed)
Left patient message we are not here after 5pm left detailed message on patients VM

## 2019-10-22 DIAGNOSIS — E119 Type 2 diabetes mellitus without complications: Secondary | ICD-10-CM | POA: Diagnosis not present

## 2019-10-22 DIAGNOSIS — D3131 Benign neoplasm of right choroid: Secondary | ICD-10-CM | POA: Diagnosis not present

## 2019-10-22 LAB — HM DIABETES EYE EXAM

## 2019-11-18 DIAGNOSIS — Z713 Dietary counseling and surveillance: Secondary | ICD-10-CM | POA: Diagnosis not present

## 2019-12-10 ENCOUNTER — Other Ambulatory Visit: Payer: Self-pay | Admitting: Family Medicine

## 2019-12-15 ENCOUNTER — Emergency Department (HOSPITAL_COMMUNITY)
Admission: EM | Admit: 2019-12-15 | Discharge: 2019-12-16 | Discharge status: Against Medical Advice | Payer: BC Managed Care – PPO | Attending: Emergency Medicine | Admitting: Emergency Medicine

## 2019-12-15 ENCOUNTER — Ambulatory Visit: Payer: BC Managed Care – PPO

## 2019-12-15 ENCOUNTER — Emergency Department (HOSPITAL_COMMUNITY): Payer: BC Managed Care – PPO

## 2019-12-15 ENCOUNTER — Encounter (HOSPITAL_COMMUNITY): Payer: Self-pay | Admitting: Emergency Medicine

## 2019-12-15 ENCOUNTER — Other Ambulatory Visit: Payer: Self-pay

## 2019-12-15 DIAGNOSIS — Z5321 Procedure and treatment not carried out due to patient leaving prior to being seen by health care provider: Secondary | ICD-10-CM | POA: Diagnosis not present

## 2019-12-15 DIAGNOSIS — M542 Cervicalgia: Secondary | ICD-10-CM | POA: Diagnosis not present

## 2019-12-15 LAB — BASIC METABOLIC PANEL
Anion gap: 9 (ref 5–15)
BUN: 12 mg/dL (ref 6–20)
CO2: 28 mmol/L (ref 22–32)
Calcium: 9.2 mg/dL (ref 8.9–10.3)
Chloride: 98 mmol/L (ref 98–111)
Creatinine, Ser: 1.29 mg/dL — ABNORMAL HIGH (ref 0.61–1.24)
GFR calc Af Amer: >60 mL/min (ref >60)
GFR calc non Af Amer: >60 mL/min (ref >60)
Glucose, Bld: 136 mg/dL — ABNORMAL HIGH (ref 70–99)
Potassium: 4.8 mmol/L (ref 3.5–5.1)
Sodium: 135 mmol/L (ref 135–145)

## 2019-12-15 LAB — CBC
HCT: 58.3 % — ABNORMAL HIGH (ref 39.0–52.0)
Hemoglobin: 19.5 g/dL — ABNORMAL HIGH (ref 13.0–17.0)
MCH: 31.2 pg (ref 26.0–34.0)
MCHC: 33.4 g/dL (ref 30.0–36.0)
MCV: 93.3 fL (ref 80.0–100.0)
Platelets: 196 10*3/uL (ref 150–400)
RBC: 6.25 MIL/uL — ABNORMAL HIGH (ref 4.22–5.81)
RDW: 14 % (ref 11.5–15.5)
WBC: 10.7 10*3/uL — ABNORMAL HIGH (ref 4.0–10.5)
nRBC: 0 % (ref 0.0–0.2)

## 2019-12-15 MED ORDER — SODIUM CHLORIDE 0.9% FLUSH: 3.0000 mL | Freq: Once | INTRAVENOUS | Status: DC

## 2019-12-15 MED ORDER — OXYCODONE-ACETAMINOPHEN 5-325 MG PO TABS
1.0000 | ORAL_TABLET | ORAL | Status: DC | PRN
  Administered 2019-12-15: 1 via ORAL
  Filled 2019-12-15: qty 1

## 2019-12-15 NOTE — ED Notes (Signed)
Pt up to sort desk requesting pain medication and asking about wait time

## 2019-12-15 NOTE — ED Triage Notes (Signed)
Pt arrives to ED from home with complaints of neck pain since Friday after working out. Patient states he went to the ED in [place] and they told him he has some compression in his spine. Patient unsure of the exact results but is here for MRI and neuro consult.

## 2019-12-16 ENCOUNTER — Telehealth: Payer: Self-pay | Admitting: Family Medicine

## 2019-12-16 NOTE — ED Notes (Signed)
Called for room, no answer

## 2019-12-16 NOTE — Telephone Encounter (Signed)
ED note states he left before being seen by provider. If he feels he needs a MRI or other imaging, he must be evaluated.

## 2019-12-16 NOTE — Telephone Encounter (Signed)
Appt made

## 2019-12-16 NOTE — Telephone Encounter (Signed)
Insurance will not pay for MRI without being seen in office

## 2019-12-17 ENCOUNTER — Ambulatory Visit (INDEPENDENT_AMBULATORY_CARE_PROVIDER_SITE_OTHER): Payer: BC Managed Care – PPO | Admitting: Family Medicine

## 2019-12-17 ENCOUNTER — Encounter: Payer: Self-pay | Admitting: Family Medicine

## 2019-12-17 DIAGNOSIS — M47812 Spondylosis without myelopathy or radiculopathy, cervical region: Secondary | ICD-10-CM | POA: Diagnosis not present

## 2019-12-17 DIAGNOSIS — M4802 Spinal stenosis, cervical region: Secondary | ICD-10-CM

## 2019-12-17 DIAGNOSIS — M5412 Radiculopathy, cervical region: Secondary | ICD-10-CM

## 2019-12-17 NOTE — Patient Instructions (Signed)

## 2019-12-17 NOTE — Progress Notes (Signed)
Virtual Visit via Telephone Note  I connected with James [name] on 12/17/19 at 3:01 PM by telephone and verified that I am speaking with the correct person using two identifiers. James [name] is currently located at home and nobody is currently with him during this visit. The provider, Loman Brooklyn, FNP is located in their home at time of visit.  I discussed the limitations, risks, security and privacy concerns of performing an evaluation and management service by telephone and the availability of in person appointments. I also discussed with the patient that there may be a patient responsible charge related to this service. The patient expressed understanding and agreed to proceed.  Subjective: PCP: Baruch Gouty, FNP  Chief Complaint  Patient presents with  . Neck Pain   Patient reports severe neck pain that is shooting down his right arm and shoulder.  He reports the only relief he gets is when he is lying down.  He was seen at [facility] on 12/15/2019 where he was prescribed steroids and Norco.  They did do an x-ray of his cervical spine which revealed cervical spondylosis with bilateral neural foraminal stenosis at C6-7.  Patient reports for quite a while now he would experience some numbness and tingling in his arms after working out or when he was riding his motorcycle but it always subsided pretty quickly.  He reports he was working out on Friday and woke up feeling terrible the next day.  He reports when he was in the emergency room he was told if they had a neurosurgeon on staff that he would be having surgery the same day.  He would like to see Dr. Sherwood Gambler or one of his associates.  He reports he was told he would need an MRI before an appointment could be made.  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   ROS: Per HPI  Current Outpatient Medications:  .  aspirin EC 81 MG tablet, Take 1 tablet (81 mg  total) by mouth daily., Disp: 90 tablet, Rfl: 6 .  atorvastatin (LIPITOR) 40 MG tablet, Take 1 tablet (40 mg total) by mouth daily., Disp: 90 tablet, Rfl: 3 .  blood glucose meter kit and supplies, Dispense based on patient and insurance preference. Use up to four times daily as directed. (FOR ICD-10 E10.9, E11.9)., Disp: 1 each, Rfl: 0 .  cyanocobalamin 100 MCG tablet, Take 100 mcg by mouth daily., Disp: , Rfl:  .  fenofibrate 160 MG tablet, TAKE ONE (1) TABLET EACH DAY, Disp: 90 tablet, Rfl: 2 .  levothyroxine (SYNTHROID) 50 MCG tablet, Take 1 tablet (50 mcg total) by mouth daily., Disp: 90 tablet, Rfl: 3 .  losartan-hydrochlorothiazide (HYZAAR) 50-12.5 MG tablet, TAKE ONE (1) TABLET EACH DAY, Disp: 90 tablet, Rfl: 0 .  metFORMIN (GLUCOPHAGE) 500 MG tablet, Take 1 tablet (500 mg total) by mouth daily with breakfast., Disp: 180 tablet, Rfl: 3 .  methylPREDNISolone (MEDROL DOSEPAK) 4 MG TBPK tablet, , Disp: , Rfl:  .  metoprolol succinate (TOPROL-XL) 50 MG 24 hr tablet, TAKE ONE TABLET BY MOUTH TWICE DAILY, Disp: 180 tablet, Rfl: 0 .  NORCO 5-325 MG tablet, Take 1 tablet by mouth every 4 (four) hours as needed., Disp: , Rfl:  .  testosterone cypionate (DEPOTESTOSTERONE CYPIONATE) 200 MG/ML injection, Inject 1 ml every week, Disp: , Rfl:  .  therapeutic multivitamin-minerals (THERAGRAN-M) tablet, Take by mouth., Disp: ,  Rfl:  .  Turmeric 500 MG TABS, Take by mouth., Disp: , Rfl:   No Known Allergies Past Medical History:  Diagnosis Date  . Hyperlipidemia   . Hypertension     Observations/Objective: A&O  No respiratory distress or wheezing audible over the phone Mood, judgement, and thought processes all WNL  Assessment and Plan: 1-3. Cervical radiculopathy/Cervical spondylosis/Foraminal stenosis of cervical region - Encouraged patient to continue steroids until he has completed the entire course.  He has Norco to take as needed.  Discussed that it can sometimes be very difficult to get an  MRI approved prior to failing 6 weeks of physical therapy but that I would try.  I have ordered this MRI stat and I have also ordered his referral to the neurosurgeon as urgent.  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  Education provided on cervical radiculopathy. - Ambulatory referral to Neurosurgery - MR Cervical Spine Wo Contrast; Future   Follow Up Instructions:  I discussed the assessment and treatment plan with the patient. The patient was provided an opportunity to ask questions and all were answered. The patient agreed with the plan and demonstrated an understanding of the instructions.   The patient was advised to call back or seek an in-person evaluation if the symptoms worsen or if the condition fails to improve as anticipated.  The above assessment and management plan was discussed with the patient. The patient verbalized understanding of and has agreed to the management plan. Patient is aware to call the clinic if symptoms persist or worsen. Patient is aware when to return to the clinic for a follow-up visit. Patient educated on when it is appropriate to go to the emergency department.   Time call ended: 3:18 PM  I provided 19 minutes of non-face-to-face time during this encounter.  Hendricks Limes, MSN, APRN, FNP-C [facility] 12/17/19

## 2019-12-22 ENCOUNTER — Telehealth: Payer: Self-pay | Admitting: Family Medicine

## 2019-12-23 ENCOUNTER — Telehealth: Payer: Self-pay | Admitting: Family Medicine

## 2019-12-23 NOTE — Telephone Encounter (Signed)
Spoke with pt and advised no pain medications can be prescribed outside of an office visit and pt voiced understanding. Advised pt we could do work not till 12/30/19 when he sees neurosurgeon and then they can evaluate him and decide when he should return to work. Pt voiced understanding and work note will be in pt MyChart for him to print out.

## 2019-12-24 ENCOUNTER — Encounter: Payer: Self-pay | Admitting: Family Medicine

## 2019-12-31 NOTE — Telephone Encounter (Signed)
Hopefully if [employer] sees the need they can order and get it approved.

## 2020-01-01 LAB — HM DIABETES EYE EXAM

## 2020-01-07 ENCOUNTER — Other Ambulatory Visit: Payer: Self-pay | Admitting: Neurosurgery

## 2020-01-07 DIAGNOSIS — M4722 Other spondylosis with radiculopathy, cervical region: Secondary | ICD-10-CM

## 2020-01-09 ENCOUNTER — Other Ambulatory Visit: Payer: Self-pay | Admitting: Family Medicine

## 2020-01-15 ENCOUNTER — Other Ambulatory Visit: Payer: Self-pay | Admitting: Family Medicine

## 2020-01-16 ENCOUNTER — Other Ambulatory Visit: Payer: Self-pay

## 2020-01-16 ENCOUNTER — Telehealth: Payer: Self-pay | Admitting: Family Medicine

## 2020-01-19 ENCOUNTER — Other Ambulatory Visit: Payer: Self-pay

## 2020-01-19 ENCOUNTER — Ambulatory Visit (INDEPENDENT_AMBULATORY_CARE_PROVIDER_SITE_OTHER): Payer: 59 | Admitting: Family Medicine

## 2020-01-19 ENCOUNTER — Ambulatory Visit
Admission: RE | Admit: 2020-01-19 | Discharge: 2020-01-19 | Disposition: A | Discharge status: Home / Self Care | Payer: BC Managed Care – PPO | Source: Ambulatory Visit | Attending: Neurosurgery | Admitting: Neurosurgery

## 2020-01-19 ENCOUNTER — Encounter: Payer: Self-pay | Admitting: Family Medicine

## 2020-01-19 VITALS — BP 152/78 | HR 94 | Temp 97.8°F | Resp 20 | Ht 73.0 in | Wt 273.0 lb

## 2020-01-19 DIAGNOSIS — E119 Type 2 diabetes mellitus without complications: Secondary | ICD-10-CM

## 2020-01-19 DIAGNOSIS — M4722 Other spondylosis with radiculopathy, cervical region: Secondary | ICD-10-CM

## 2020-01-19 DIAGNOSIS — E1169 Type 2 diabetes mellitus with other specified complication: Secondary | ICD-10-CM | POA: Diagnosis not present

## 2020-01-19 DIAGNOSIS — E1159 Type 2 diabetes mellitus with other circulatory complications: Secondary | ICD-10-CM

## 2020-01-19 DIAGNOSIS — L918 Other hypertrophic disorders of the skin: Secondary | ICD-10-CM

## 2020-01-19 DIAGNOSIS — E039 Hypothyroidism, unspecified: Secondary | ICD-10-CM

## 2020-01-19 DIAGNOSIS — E785 Hyperlipidemia, unspecified: Secondary | ICD-10-CM

## 2020-01-19 DIAGNOSIS — I10 Essential (primary) hypertension: Secondary | ICD-10-CM

## 2020-01-19 DIAGNOSIS — I152 Hypertension secondary to endocrine disorders: Secondary | ICD-10-CM

## 2020-01-19 LAB — BAYER DCA HB A1C WAIVED: HB A1C (BAYER DCA - WAIVED): 6.2 % (ref <7.0)

## 2020-01-19 NOTE — Progress Notes (Signed)
Subjective:  Patient ID: James [name], male    DOB: [birth year], 54 y.o.   MRN: [id number]  Patient Care Team: Baruch Gouty, FNP as PCP - General (Family Medicine)   Chief Complaint:  Medical Management of Chronic Issues (3 mo ), Diabetes, Hypothyroidism, Hyperlipidemia, and Hypertension   HPI: James [name] is a 54 y.o. male presenting on 01/19/2020 for Medical Management of Chronic Issues (3 mo ), Diabetes, Hypothyroidism, Hyperlipidemia, and Hypertension   1. Type 2 diabetes mellitus without complication, without long-term current use of insulin (HCC) Compliant with medications. Tries to watch diet. Does work out on a daily basis. No polyuria, polydipsia, or polyphagia.   2. Hypertension associated with diabetes ([place]) Compliant with medications. No headaches, chest pain, palpitations, dizziness, weakness, confusion, syncope, or leg swelling.   3. Hyperlipidemia associated with type 2 diabetes mellitus ([place]) Does try to watch diet. Does work out daily. Compliant with medications without associated side effects.   4. Acquired hypothyroidism On 50 mcg synthroid and tolerating well. Denies fatigue, insomnia, weight changes, mood changes, chest pain, palpitations, or changes in bowel habits.   5. Multiple acquired skin tags Pt reports numerous skin tags to his axilla, abdomen, groin, and buttocks. States several of them are irritated and painful due to location and getting caught on clothing. No drainage from the lesions.     Relevant past medical, surgical, family, and social history reviewed and updated as indicated.  Allergies and medications reviewed and updated. Date reviewed: Chart in Epic.   Past Medical History:  Diagnosis Date  . Hyperlipidemia   . Hypertension     Past Surgical History:  Procedure Laterality Date  . BACK SURGERY    . TONSILLECTOMY      Social History   Socioeconomic History  . Marital status: Married    Spouse name: Not on file  .  Number of children: Not on file  . Years of education: Not on file  . Highest education level: Not on file  Occupational History  . Not on file  Tobacco Use  . Smoking status: Never Smoker  . Smokeless tobacco: Never Used  Substance and Sexual Activity  . Alcohol use: No  . Drug use: No  . Sexual activity: Yes    Birth control/protection: None  Other Topics Concern  . Not on file  Social History Narrative  . Not on file   Social Determinants of Health   Financial Resource Strain:   . Difficulty of Paying Living Expenses: Not on file  Food Insecurity:   . Worried About Charity fundraiser in the Last Year: Not on file  . Ran Out of Food in the Last Year: Not on file  Transportation Needs:   . Lack of Transportation (Medical): Not on file  . Lack of Transportation (Non-Medical): Not on file  Physical Activity:   . Days of Exercise per Week: Not on file  . Minutes of Exercise per Session: Not on file  Stress:   . Feeling of Stress : Not on file  Social Connections:   . Frequency of Communication with Friends and Family: Not on file  . Frequency of Social Gatherings with Friends and Family: Not on file  . Attends Religious Services: Not on file  . Active Member of Clubs or Organizations: Not on file  . Attends Archivist Meetings: Not on file  . Marital Status: Not on file  Intimate Partner Violence:   . Fear of  Current or Ex-Partner: Not on file  . Emotionally Abused: Not on file  . Physically Abused: Not on file  . Sexually Abused: Not on file    Outpatient Encounter Medications as of 01/19/2020  Medication Sig  . atorvastatin (LIPITOR) 40 MG tablet Take 1 tablet (40 mg total) by mouth daily.  . blood glucose meter kit and supplies Dispense based on patient and insurance preference. Use up to four times daily as directed. (FOR ICD-10 E10.9, E11.9).  . cyanocobalamin 100 MCG tablet Take 100 mcg by mouth daily.  . fenofibrate 160 MG tablet TAKE ONE (1) TABLET  EACH DAY  . levothyroxine (SYNTHROID) 50 MCG tablet Take 1 tablet (50 mcg total) by mouth daily.  Marland Kitchen losartan-hydrochlorothiazide (HYZAAR) 50-12.5 MG tablet TAKE ONE (1) TABLET EACH DAY  . metFORMIN (GLUCOPHAGE) 500 MG tablet Take 1 tablet (500 mg total) by mouth daily with breakfast.  . metoprolol succinate (TOPROL-XL) 50 MG 24 hr tablet TAKE ONE TABLET BY MOUTH TWICE DAILY  . testosterone cypionate (DEPOTESTOSTERONE CYPIONATE) 200 MG/ML injection INJECT 0.75ML IM EVERY 7 DAYS  . therapeutic multivitamin-minerals (THERAGRAN-M) tablet Take by mouth.  . Turmeric 500 MG TABS Take by mouth.  . [DISCONTINUED] methylPREDNISolone (MEDROL DOSEPAK) 4 MG TBPK tablet   . [DISCONTINUED] NORCO 5-325 MG tablet Take 1 tablet by mouth every 4 (four) hours as needed.   No facility-administered encounter medications on file as of 01/19/2020.    No Known Allergies  Review of Systems  Constitutional: Negative for activity change, appetite change, chills, diaphoresis, fatigue, fever and unexpected weight change.  HENT: Negative.   Eyes: Negative.  Negative for photophobia and visual disturbance.  Respiratory: Negative for cough, chest tightness and shortness of breath.   Cardiovascular: Negative for chest pain, palpitations and leg swelling.  Gastrointestinal: Negative for abdominal pain, blood in stool, constipation, diarrhea, nausea and vomiting.  Endocrine: Negative.  Negative for cold intolerance, heat intolerance, polydipsia, polyphagia and polyuria.  Genitourinary: Negative for decreased urine volume, difficulty urinating, dysuria, frequency, penile swelling, scrotal swelling, testicular pain and urgency.  Musculoskeletal: Negative for arthralgias and myalgias.  Skin:       Multiple inflamed skin tags.  Allergic/Immunologic: Negative.   Neurological: Negative for dizziness, tremors, seizures, syncope, facial asymmetry, speech difficulty, weakness, light-headedness, numbness and headaches.    Hematological: Negative.   Psychiatric/Behavioral: Negative for confusion, hallucinations, sleep disturbance and suicidal ideas.  All other systems reviewed and are negative.       Objective:  BP (!) 152/78   Pulse 94   Temp 97.8 F (36.6 C)   Resp 20   Ht '6\' 1"'$  (1.854 m)   Wt 273 lb (123.8 kg)   SpO2 98%   BMI 36.02 kg/m    Wt Readings from Last 3 Encounters:  01/19/20 273 lb (123.8 kg)  10/17/19 283 lb (128.4 kg)  10/01/19 284 lb (128.8 kg)    Physical Exam Vitals and nursing note reviewed.  Constitutional:      General: He is not in acute distress.    Appearance: Normal appearance. He is well-developed and well-groomed. He is morbidly obese. He is not ill-appearing, toxic-appearing or diaphoretic.  HENT:     Head: Normocephalic and atraumatic.     Jaw: There is normal jaw occlusion.     Right Ear: Hearing normal.     Left Ear: Hearing normal.     Nose: Nose normal.     Mouth/Throat:     Lips: Pink.     Mouth: Mucous  membranes are moist.     Pharynx: Oropharynx is clear. Uvula midline.  Eyes:     General: Lids are normal.     Extraocular Movements: Extraocular movements intact.     Conjunctiva/sclera: Conjunctivae normal.     Pupils: Pupils are equal, round, and reactive to light.  Neck:     Thyroid: No thyroid mass, thyromegaly or thyroid tenderness.     Vascular: No carotid bruit or JVD.     Trachea: Trachea and phonation normal.  Cardiovascular:     Rate and Rhythm: Normal rate and regular rhythm.     Chest Wall: PMI is not displaced.     Pulses: Normal pulses.     Heart sounds: Normal heart sounds. No murmur. No friction rub. No gallop.   Pulmonary:     Effort: Pulmonary effort is normal. No respiratory distress.     Breath sounds: Normal breath sounds. No wheezing.  Abdominal:     General: Bowel sounds are normal. There is no distension or abdominal bruit.     Palpations: Abdomen is soft. There is no hepatomegaly or splenomegaly.     Tenderness:  There is no abdominal tenderness. There is no right CVA tenderness or left CVA tenderness.     Hernia: No hernia is present.  Musculoskeletal:        General: Normal range of motion.     Cervical back: Normal range of motion and neck supple.     Right lower leg: No edema.     Left lower leg: No edema.  Lymphadenopathy:     Cervical: No cervical adenopathy.  Skin:    General: Skin is warm and dry.     Capillary Refill: Capillary refill takes less than 2 seconds.     Coloration: Skin is not cyanotic, jaundiced or pale.     Findings: Erythema and lesion present. No rash.          Comments: Multiple pedunculated   Neurological:     General: No focal deficit present.     Mental Status: He is alert and oriented to person, place, and time.     Cranial Nerves: Cranial nerves are intact. No cranial nerve deficit.     Sensory: Sensation is intact. No sensory deficit.     Motor: Motor function is intact. No weakness.     Coordination: Coordination is intact. Coordination normal.     Gait: Gait is intact. Gait normal.     Deep Tendon Reflexes: Reflexes are normal and symmetric. Reflexes normal.  Psychiatric:        Attention and Perception: Attention and perception normal.        Mood and Affect: Mood and affect normal.        Speech: Speech normal.        Behavior: Behavior normal. Behavior is cooperative.        Thought Content: Thought content normal.        Cognition and Memory: Cognition and memory normal.        Judgment: Judgment normal.     Results for orders placed or performed during the hospital encounter of 88/82/80  Basic metabolic panel  Result Value Ref Range   Sodium 135 135 - 145 mmol/L   Potassium 4.8 3.5 - 5.1 mmol/L   Chloride 98 98 - 111 mmol/L   CO2 28 22 - 32 mmol/L   Glucose, Bld 136 (H) 70 - 99 mg/dL   BUN 12 6 - 20 mg/dL   Creatinine, Ser  1.29 (H) 0.61 - 1.24 mg/dL   Calcium 9.2 8.9 - 10.3 mg/dL   GFR calc non Af Amer >60 >60 mL/min   GFR calc Af Amer  >60 >60 mL/min   Anion gap 9 5 - 15  CBC  Result Value Ref Range   WBC 10.7 (H) 4.0 - 10.5 K/uL   RBC 6.25 (H) 4.22 - 5.81 MIL/uL   Hemoglobin 19.5 (H) 13.0 - 17.0 g/dL   HCT 58.3 (H) 39.0 - 52.0 %   MCV 93.3 80.0 - 100.0 fL   MCH 31.2 26.0 - 34.0 pg   MCHC 33.4 30.0 - 36.0 g/dL   RDW 14.0 11.5 - 15.5 %   Platelets 196 150 - 400 K/uL   nRBC 0.0 0.0 - 0.2 %   Skin Tag Removal Pt gave verbal consent to remove inflamed skin tags. Pts name, DOB, allergies, and procedure verified prior to procedure. Skin tag removal: All areas prepped with betadine. Using forceps and 11 blade excised 11 skin tags. Used silver nitrate sticks for hemostasis and pressure dressing with topical antibiotic. Patient tolerated well and had minimal bleeding. Aftercare discussed in detail.   Pertinent labs & imaging results that were available during my care of the patient were reviewed by me and considered in my medical decision making.  Assessment & Plan:  Samil was seen today for medical management of chronic issues, diabetes, hypothyroidism, hyperlipidemia and hypertension.  Diagnoses and all orders for this visit:  Type 2 diabetes mellitus without complication, without long-term current use of insulin (HCC) A1C 6.2 today. Pt encouraged to keep up the good work. No changed in regimen. Diet and exercise encouraged. Follow up as scheduled.  -     CBC with Differential/Platelet -     Bayer DCA Hb A1c Waived  Hypertension associated with diabetes ([place]) BP fairly controlled. Changes were not made in regimen today, pt was nervous due to procedure. Goal BP is 130/80. Pt aware to report any persistent high or low readings. DASH diet and exercise encouraged. Exercise at least 150 minutes per week and increase as tolerated. Goal BMI > 25. Stress management encouraged. Avoid nicotine and tobacco product use. Avoid excessive alcohol and NSAID's. Avoid more than 2000 mg of sodium daily. Medications as prescribed. Follow up  as scheduled.  -     CBC with Differential/Platelet -     CMP14+EGFR  Hyperlipidemia associated with type 2 diabetes mellitus ([place]) Diet encouraged - increase intake of fresh fruits and vegetables, increase intake of lean proteins. Bake, broil, or grill foods. Avoid fried, greasy, and fatty foods. Avoid fast foods. Increase intake of fiber-rich whole grains. Exercise encouraged - at least 150 minutes per week and advance as tolerated.  Goal BMI < 25. Continue medications as prescribed. Follow up in 3-6 months as discussed.  -     CBC with Differential/Platelet -     Lipid panel  Acquired hypothyroidism Thyroid disease has been well controlled. Labs are pending. Adjustments to regimen will be made if warranted. Make sure to take medications on an empty stomach with a full glass of water. Make sure to avoid vitamins or supplements for at least 4 hours before and 4 hours after taking medications. Repeat labs in 3 months if adjustments are made and in 6 months if stable.   -     Thyroid Panel With TSH  Inflamed skin tag Multiple acquired skin tags Inflamed skin tags removed in office. Pt tolerated very well. No bleeding post procedure.  Aftercare discussed in detail. Pt aware of signs and symptoms of infection and when to report.    Total time spent with patient 55 minutes.  Greater than 50% of encounter spent in coordination of care/counseling.  Continue all other maintenance medications.  Follow up plan: Return in about 3 months (around 04/17/2020), or if symptoms worsen or fail to improve.  Continue healthy lifestyle choices, including diet (rich in fruits, vegetables, and lean proteins, and low in salt and simple carbohydrates) and exercise (at least 30 minutes of moderate physical activity daily).  Educational handout given for skin tag removal and wound care  The above assessment and management plan was discussed with the patient. The patient verbalized understanding of and has agreed to  the management plan. Patient is aware to call the clinic if they develop any new symptoms or if symptoms persist or worsen. Patient is aware when to return to the clinic for a follow-up visit. Patient educated on when it is appropriate to go to the emergency department.   Monia Pouch, FNP-C [facility] [phone]

## 2020-01-19 NOTE — Patient Instructions (Addendum)
Skin Tag, Adult  A skin tag (acrochordon) is a soft, extra growth of skin. Most skin tags are flesh-colored and rarely bigger than a pencil eraser. They commonly form near areas where there are folds in the skin, such as the armpit or groin. Skin tags are not dangerous, and they do not spread from person to person (are not contagious). You may have one skin tag or several. Skin tags do not require treatment. However, your health care provider may recommend removal of a skin tag if it:  Gets irritated from clothing.  Bleeds.  Is visible and unsightly. Your health care provider can remove skin tags with a simple surgical procedure or a procedure that involves freezing the skin tag. Follow these instructions at home:  Watch for any changes in your skin tag. A normal skin tag does not require any other special care at home.  Take over-the-counter and prescription medicines only as told by your health care provider.  Keep all follow-up visits as told by your health care provider. This is important. Contact a health care provider if:  You have a skin tag that: ? Becomes painful. ? Changes color. ? Bleeds. ? Swells.  You develop more skin tags. This information is not intended to replace advice given to you by your health care provider. Make sure you discuss any questions you have with your health care provider. Document Revised: 11/16/2017 Document Reviewed: 12/19/2015 Elsevier Patient Education  [place], Adult Taking care of your wound properly can help to prevent pain, infection, and scarring. It can also help your wound to heal more quickly. How to care for your wound Wound care      Follow instructions from your health care provider about how to take care of your wound. Make sure you: ? Wash your hands with soap and water before you change the bandage (dressing). If soap and water are not available, use hand sanitizer. ? Change your dressing as told by  your health care provider. ? Leave stitches (sutures), skin glue, or adhesive strips in place. These skin closures may need to stay in place for 2 weeks or longer. If adhesive strip edges start to loosen and curl up, you may trim the loose edges. Do not remove adhesive strips completely unless your health care provider tells you to do that.  Check your wound area every day for signs of infection. Check for: ? Redness, swelling, or pain. ? Fluid or blood. ? Warmth. ? Pus or a bad smell.  Ask your health care provider if you should clean the wound with mild soap and water. Doing this may include: ? Using a clean towel to pat the wound dry after cleaning it. Do not rub or scrub the wound. ? Applying a cream or ointment. Do this only as told by your health care provider. ? Covering the incision with a clean dressing.  Ask your health care provider when you can leave the wound uncovered.  Keep the dressing dry until your health care provider says it can be removed. Do not take baths, swim, use a hot tub, or do anything that would put the wound underwater until your health care provider approves. Ask your health care provider if you can take showers. You may only be allowed to take sponge baths. Medicines   If you were prescribed an antibiotic medicine, cream, or ointment, take or use the antibiotic as told by your health care provider. Do not stop taking or  using the antibiotic even if your condition improves.  Take over-the-counter and prescription medicines only as told by your health care provider. If you were prescribed pain medicine, take it 30 or more minutes before you do any wound care or as told by your health care provider. General instructions  Return to your normal activities as told by your health care provider. Ask your health care provider what activities are safe.  Do not scratch or pick at the wound.  Do not use any products that contain nicotine or tobacco, such as  cigarettes and e-cigarettes. These may delay wound healing. If you need help quitting, ask your health care provider.  Keep all follow-up visits as told by your health care provider. This is important.  Eat a diet that includes protein, vitamin A, vitamin C, and other nutrient-rich foods to help the wound heal. ? Foods rich in protein include meat, dairy, beans, nuts, and other sources. ? Foods rich in vitamin A include carrots and dark green, leafy vegetables. ? Foods rich in vitamin C include citrus, tomatoes, and other fruits and vegetables. ? Nutrient-rich foods have protein, carbohydrates, fat, vitamins, or minerals. Eat a variety of healthy foods including vegetables, fruits, and whole grains. Contact a health care provider if:  You received a tetanus shot and you have swelling, severe pain, redness, or bleeding at the injection site.  Your pain is not controlled with medicine.  You have redness, swelling, or pain around the wound.  You have fluid or blood coming from the wound.  Your wound feels warm to the touch.  You have pus or a bad smell coming from the wound.  You have a fever or chills.  You are nauseous or you vomit.  You are dizzy. Get help right away if:  You have a red streak going away from your wound.  The edges of the wound open up and separate.  Your wound is bleeding, and the bleeding does not stop with gentle pressure.  You have a rash.  You faint.  You have trouble breathing. Summary  Always wash your hands with soap and water before changing your bandage (dressing).  To help with healing, eat foods that are rich in protein, vitamin A, vitamin C, and other nutrients.  Check your wound every day for signs of infection. Contact your health care provider if you suspect that your wound is infected. This information is not intended to replace advice given to you by your health care provider. Make sure you discuss any questions you have with your  health care provider. Document Revised: 03/24/2019 Document Reviewed: 06/20/2016 Elsevier Patient Education  [place].

## 2020-01-20 LAB — CMP14+EGFR
ALT: 48 IU/L — ABNORMAL HIGH (ref 0–44)
AST: 19 IU/L (ref 0–40)
Albumin/Globulin Ratio: 1.6 (ref 1.2–2.2)
Albumin: 4.2 g/dL (ref 3.8–4.9)
Alkaline Phosphatase: 63 IU/L (ref 39–117)
BUN/Creatinine Ratio: 13 (ref 9–20)
BUN: 18 mg/dL (ref 6–24)
Bilirubin Total: 0.9 mg/dL (ref 0.0–1.2)
CO2: 25 mmol/L (ref 20–29)
Calcium: 9.5 mg/dL (ref 8.7–10.2)
Chloride: 101 mmol/L (ref 96–106)
Creatinine, Ser: 1.39 mg/dL — ABNORMAL HIGH (ref 0.76–1.27)
GFR calc Af Amer: 67 mL/min/{1.73_m2} (ref >59)
GFR calc non Af Amer: 58 mL/min/{1.73_m2} — ABNORMAL LOW (ref >59)
Globulin, Total: 2.7 g/dL (ref 1.5–4.5)
Glucose: 163 mg/dL — ABNORMAL HIGH (ref 65–99)
Potassium: 4.5 mmol/L (ref 3.5–5.2)
Sodium: 142 mmol/L (ref 134–144)
Total Protein: 6.9 g/dL (ref 6.0–8.5)

## 2020-01-20 LAB — CBC WITH DIFFERENTIAL/PLATELET
Basophils Absolute: 0.1 10*3/uL (ref 0.0–0.2)
Basos: 0 %
EOS (ABSOLUTE): 0.2 10*3/uL (ref 0.0–0.4)
Eos: 2 %
Hematocrit: 58.8 % — ABNORMAL HIGH (ref 37.5–51.0)
Hemoglobin: 19.5 g/dL — ABNORMAL HIGH (ref 13.0–17.7)
Immature Grans (Abs): 0 10*3/uL (ref 0.0–0.1)
Immature Granulocytes: 0 %
Lymphocytes Absolute: 2 10*3/uL (ref 0.7–3.1)
Lymphs: 18 %
MCH: 30.4 pg (ref 26.6–33.0)
MCHC: 33.2 g/dL (ref 31.5–35.7)
MCV: 92 fL (ref 79–97)
Monocytes Absolute: 0.8 10*3/uL (ref 0.1–0.9)
Monocytes: 7 %
Neutrophils Absolute: 8.4 10*3/uL — ABNORMAL HIGH (ref 1.4–7.0)
Neutrophils: 73 %
Platelets: 241 10*3/uL (ref 150–450)
RBC: 6.41 x10E6/uL — ABNORMAL HIGH (ref 4.14–5.80)
RDW: 14.4 % (ref 11.6–15.4)
WBC: 11.5 10*3/uL — ABNORMAL HIGH (ref 3.4–10.8)

## 2020-01-20 LAB — LIPID PANEL
Chol/HDL Ratio: 4.5 ratio (ref 0.0–5.0)
Cholesterol, Total: 112 mg/dL (ref 100–199)
HDL: 25 mg/dL — ABNORMAL LOW (ref >39)
LDL Chol Calc (NIH): 50 mg/dL (ref 0–99)
Triglycerides: 230 mg/dL — ABNORMAL HIGH (ref 0–149)
VLDL Cholesterol Cal: 37 mg/dL (ref 5–40)

## 2020-01-28 ENCOUNTER — Ambulatory Visit (INDEPENDENT_AMBULATORY_CARE_PROVIDER_SITE_OTHER): Payer: 59 | Admitting: Family Medicine

## 2020-01-28 ENCOUNTER — Encounter: Payer: Self-pay | Admitting: Family Medicine

## 2020-01-28 ENCOUNTER — Other Ambulatory Visit: Payer: Self-pay

## 2020-01-28 VITALS — BP 137/78 | HR 83 | Temp 99.0°F | Resp 20 | Ht 73.0 in | Wt 276.0 lb

## 2020-01-28 DIAGNOSIS — L918 Other hypertrophic disorders of the skin: Secondary | ICD-10-CM | POA: Diagnosis not present

## 2020-01-28 DIAGNOSIS — N289 Disorder of kidney and ureter, unspecified: Secondary | ICD-10-CM | POA: Diagnosis not present

## 2020-01-28 DIAGNOSIS — I10 Essential (primary) hypertension: Secondary | ICD-10-CM

## 2020-01-28 DIAGNOSIS — E1159 Type 2 diabetes mellitus with other circulatory complications: Secondary | ICD-10-CM | POA: Diagnosis not present

## 2020-01-28 DIAGNOSIS — I152 Hypertension secondary to endocrine disorders: Secondary | ICD-10-CM

## 2020-01-28 DIAGNOSIS — Z23 Encounter for immunization: Secondary | ICD-10-CM | POA: Diagnosis not present

## 2020-01-28 NOTE — Progress Notes (Signed)
Subjective:  Patient ID: James [name], male    DOB: [birth year], 54 y.o.   MRN: [id number]  Patient Care Team: Baruch Gouty, FNP as PCP - General (Family Medicine)   Chief Complaint:  skin tag (1 week  - repeat BMP )   HPI: James [name] is a 54 y.o. male presenting on 01/28/2020 for skin tag (1 week  - repeat BMP )  Patient following up today for reevaluation of abnormal renal function studies.  Patient denies any change in urinary output.  No weakness, confusion, leg swelling, headaches, or abdominal swelling.  No hematuria.  Reports blood pressure has been well controlled with medications.  No chest pain, palpitations, dizziness, or syncope. She also reports inflamed skin tag to left buttock.  States when he puts on his underwear it pulls at the skin tag and it bleeds at times.  Relevant past medical, surgical, family, and social history reviewed and updated as indicated.  Allergies and medications reviewed and updated. Date reviewed: Chart in Epic.   Past Medical History:  Diagnosis Date  . Hyperlipidemia   . Hypertension     Past Surgical History:  Procedure Laterality Date  . BACK SURGERY    . TONSILLECTOMY      Social History   Socioeconomic History  . Marital status: Married    Spouse name: Not on file  . Number of children: Not on file  . Years of education: Not on file  . Highest education level: Not on file  Occupational History  . Not on file  Tobacco Use  . Smoking status: Never Smoker  . Smokeless tobacco: Never Used  Substance and Sexual Activity  . Alcohol use: No  . Drug use: No  . Sexual activity: Yes    Birth control/protection: None  Other Topics Concern  . Not on file  Social History Narrative  . Not on file   Social Determinants of Health   Financial Resource Strain:   . Difficulty of Paying Living Expenses: Not on file  Food Insecurity:   . Worried About Charity fundraiser in the Last Year: Not on file  . Ran Out of Food in  the Last Year: Not on file  Transportation Needs:   . Lack of Transportation (Medical): Not on file  . Lack of Transportation (Non-Medical): Not on file  Physical Activity:   . Days of Exercise per Week: Not on file  . Minutes of Exercise per Session: Not on file  Stress:   . Feeling of Stress : Not on file  Social Connections:   . Frequency of Communication with Friends and Family: Not on file  . Frequency of Social Gatherings with Friends and Family: Not on file  . Attends Religious Services: Not on file  . Active Member of Clubs or Organizations: Not on file  . Attends Archivist Meetings: Not on file  . Marital Status: Not on file  Intimate Partner Violence:   . Fear of Current or Ex-Partner: Not on file  . Emotionally Abused: Not on file  . Physically Abused: Not on file  . Sexually Abused: Not on file    Outpatient Encounter Medications as of 01/28/2020  Medication Sig  . atorvastatin (LIPITOR) 40 MG tablet Take 1 tablet (40 mg total) by mouth daily.  . blood glucose meter kit and supplies Dispense based on patient and insurance preference. Use up to four times daily as directed. (FOR ICD-10 E10.9, E11.9).  Marland Kitchen  cyanocobalamin 100 MCG tablet Take 100 mcg by mouth daily.  . fenofibrate 160 MG tablet TAKE ONE (1) TABLET EACH DAY  . levothyroxine (SYNTHROID) 50 MCG tablet Take 1 tablet (50 mcg total) by mouth daily.  Marland Kitchen losartan-hydrochlorothiazide (HYZAAR) 50-12.5 MG tablet TAKE ONE (1) TABLET EACH DAY  . metFORMIN (GLUCOPHAGE) 500 MG tablet Take 1 tablet (500 mg total) by mouth daily with breakfast.  . metoprolol succinate (TOPROL-XL) 50 MG 24 hr tablet TAKE ONE TABLET BY MOUTH TWICE DAILY  . testosterone cypionate (DEPOTESTOSTERONE CYPIONATE) 200 MG/ML injection INJECT 0.75ML IM EVERY 7 DAYS  . therapeutic multivitamin-minerals (THERAGRAN-M) tablet Take by mouth.  . Turmeric 500 MG TABS Take by mouth.   No facility-administered encounter medications on file as of  01/28/2020.    No Known Allergies  Review of Systems  Constitutional: Negative for activity change, appetite change, chills, diaphoresis, fatigue, fever and unexpected weight change.  HENT: Negative.   Eyes: Negative.  Negative for photophobia and visual disturbance.  Respiratory: Negative for cough, chest tightness and shortness of breath.   Cardiovascular: Negative for chest pain, palpitations and leg swelling.  Gastrointestinal: Negative for abdominal distention, abdominal pain, blood in stool, constipation, diarrhea, nausea and vomiting.  Endocrine: Negative.  Negative for cold intolerance, heat intolerance, polydipsia, polyphagia and polyuria.  Genitourinary: Negative for decreased urine volume, difficulty urinating, dysuria, frequency and urgency.  Musculoskeletal: Negative for arthralgias and myalgias.  Skin:       Inflamed skin tag  Allergic/Immunologic: Negative.   Neurological: Negative for dizziness, tremors, seizures, syncope, facial asymmetry, speech difficulty, weakness, light-headedness, numbness and headaches.  Hematological: Negative.   Psychiatric/Behavioral: Negative for agitation, confusion, hallucinations, sleep disturbance and suicidal ideas.  All other systems reviewed and are negative.       Objective:  BP 137/78   Pulse 83   Temp 99 F (37.2 C)   Resp 20   Ht _0  (1.854 m)   Wt 276 lb (125.2 kg)   SpO2 97%   BMI 36.41 kg/m    Wt Readings from Last 3 Encounters:  01/28/20 276 lb (125.2 kg)  01/19/20 273 lb (123.8 kg)  10/17/19 283 lb (128.4 kg)    Physical Exam Vitals and nursing note reviewed.  Constitutional:      General: He is not in acute distress.    Appearance: Normal appearance. He is well-developed and well-groomed. He is obese. He is not ill-appearing, toxic-appearing or diaphoretic.  HENT:     Head: Normocephalic and atraumatic.     Jaw: There is normal jaw occlusion.     Right Ear: Hearing normal.     Left Ear: Hearing normal.       Nose: Nose normal.     Mouth/Throat:     Lips: Pink.     Mouth: Mucous membranes are moist.     Pharynx: Oropharynx is clear. Uvula midline.  Eyes:     General: Lids are normal.     Extraocular Movements: Extraocular movements intact.     Conjunctiva/sclera: Conjunctivae normal.     Pupils: Pupils are equal, round, and reactive to light.  Neck:     Thyroid: No thyroid mass, thyromegaly or thyroid tenderness.     Vascular: No carotid bruit or JVD.     Trachea: Trachea and phonation normal.  Cardiovascular:     Rate and Rhythm: Normal rate and regular rhythm.     Chest Wall: PMI is not displaced.     Pulses: Normal pulses.  Heart sounds: Normal heart sounds. No murmur. No friction rub. No gallop.   Pulmonary:     Effort: Pulmonary effort is normal. No respiratory distress.     Breath sounds: Normal breath sounds. No wheezing.  Abdominal:     General: Bowel sounds are normal. There is no distension or abdominal bruit.     Palpations: Abdomen is soft. There is no hepatomegaly or splenomegaly.     Tenderness: There is no abdominal tenderness. There is no right CVA tenderness or left CVA tenderness.     Hernia: No hernia is present.  Musculoskeletal:        General: Normal range of motion.     Cervical back: Normal range of motion and neck supple.     Right lower leg: No edema.     Left lower leg: No edema.  Lymphadenopathy:     Cervical: No cervical adenopathy.  Skin:    General: Skin is warm and dry.     Capillary Refill: Capillary refill takes less than 2 seconds.     Coloration: Skin is not cyanotic, jaundiced or pale.     Findings: Lesion present. No rash.       Neurological:     General: No focal deficit present.     Mental Status: He is alert and oriented to person, place, and time.     Cranial Nerves: Cranial nerves are intact. No cranial nerve deficit.     Sensory: Sensation is intact. No sensory deficit.     Motor: Motor function is intact. No weakness.      Coordination: Coordination is intact. Coordination normal.     Gait: Gait is intact. Gait normal.     Deep Tendon Reflexes: Reflexes are normal and symmetric. Reflexes normal.  Psychiatric:        Attention and Perception: Attention and perception normal.        Mood and Affect: Mood and affect normal.        Speech: Speech normal.        Behavior: Behavior normal. Behavior is cooperative.        Thought Content: Thought content normal.        Cognition and Memory: Cognition and memory normal.        Judgment: Judgment normal.     Results for orders placed or performed in visit on 01/28/20  [facility]  Result Value Ref Range   Glucose 113 (H) 65 - 99 mg/dL   BUN 15 6 - 24 mg/dL   Creatinine, Ser 1.28 (H) 0.76 - 1.27 mg/dL   GFR calc non Af Amer 64 >59 mL/min/1.73   GFR calc Af Amer 74 >59 mL/min/1.73   BUN/Creatinine Ratio 12 9 - 20   Sodium 139 134 - 144 mmol/L   Potassium 4.4 3.5 - 5.2 mmol/L   Chloride 98 96 - 106 mmol/L   CO2 26 20 - 29 mmol/L   Calcium 9.4 8.7 - 10.2 mg/dL     Skin tag removal performed.  Patient's name, date of birth, allergies, and procedure verified.  Verbal consent obtained prior to procedure.  Area was prepped with Betadine.  Skin tag was removed in sterile fashion with hemostats and 11 blade.  Hemostasis was achieved with application of silver nitrate.  Bandage applied.  Patient tolerated procedure well, post care instructions discussed in detail.  Pertinent labs & imaging results that were available during my care of the patient were reviewed by me and considered in my medical decision making.  Assessment & Plan:  Addison was seen today for skin tag.  Diagnoses and all orders for this visit:  Hypertension associated with diabetes ([place]) BP well controlled. Changes were not made in regimen. Goal BP is 130/80. Pt aware to report any persistent high or low readings. DASH diet and exercise encouraged. Exercise at least 150 minutes per week and increase as  tolerated. Goal BMI > 25. Stress management encouraged. Avoid nicotine and tobacco product use. Avoid excessive alcohol and NSAID's. Avoid more than 2000 mg of sodium daily. Medications as prescribed. Follow up as scheduled.  -     BMP8+EGFR  Abnormal renal function Patient has increased water intake and is limiting intake of workout substances.  We will recheck renal function today. -     BMP8+EGFR  Inflamed skin tag Flame skin tag removed in office without complication.  Wound care discussed in detail.  Need for vaccination -     Pneumococcal conjugate vaccine 13-valent     Continue all other maintenance medications.  Follow up plan: Return in about 3 months (around 04/26/2020) for Chronic management .  Continue healthy lifestyle choices, including diet (rich in fruits, vegetables, and lean proteins, and low in salt and simple carbohydrates) and exercise (at least 30 minutes of moderate physical activity daily).  Educational handout given for wound care  The above assessment and management plan was discussed with the patient. The patient verbalized understanding of and has agreed to the management plan. Patient is aware to call the clinic if they develop any new symptoms or if symptoms persist or worsen. Patient is aware when to return to the clinic for a follow-up visit. Patient educated on when it is appropriate to go to the emergency department.   Monia Pouch, FNP-C [facility] [phone]

## 2020-01-28 NOTE — Patient Instructions (Signed)
Wound Care, Adult Taking care of your wound properly can help to prevent pain, infection, and scarring. It can also help your wound to heal more quickly. How to care for your wound Wound care      Follow instructions from your health care provider about how to take care of your wound. Make sure you: ? Wash your hands with soap and water before you change the bandage (dressing). If soap and water are not available, use hand sanitizer. ? Change your dressing as told by your health care provider. ? Leave stitches (sutures), skin glue, or adhesive strips in place. These skin closures may need to stay in place for 2 weeks or longer. If adhesive strip edges start to loosen and curl up, you may trim the loose edges. Do not remove adhesive strips completely unless your health care provider tells you to do that.  Check your wound area every day for signs of infection. Check for: ? Redness, swelling, or pain. ? Fluid or blood. ? Warmth. ? Pus or a bad smell.  Ask your health care provider if you should clean the wound with mild soap and water. Doing this may include: ? Using a clean towel to pat the wound dry after cleaning it. Do not rub or scrub the wound. ? Applying a cream or ointment. Do this only as told by your health care provider. ? Covering the incision with a clean dressing.  Ask your health care provider when you can leave the wound uncovered.  Keep the dressing dry until your health care provider says it can be removed. Do not take baths, swim, use a hot tub, or do anything that would put the wound underwater until your health care provider approves. Ask your health care provider if you can take showers. You may only be allowed to take sponge baths. Medicines   If you were prescribed an antibiotic medicine, cream, or ointment, take or use the antibiotic as told by your health care provider. Do not stop taking or using the antibiotic even if your condition improves.  Take  over-the-counter and prescription medicines only as told by your health care provider. If you were prescribed pain medicine, take it 30 or more minutes before you do any wound care or as told by your health care provider. General instructions  Return to your normal activities as told by your health care provider. Ask your health care provider what activities are safe.  Do not scratch or pick at the wound.  Do not use any products that contain nicotine or tobacco, such as cigarettes and e-cigarettes. These may delay wound healing. If you need help quitting, ask your health care provider.  Keep all follow-up visits as told by your health care provider. This is important.  Eat a diet that includes protein, vitamin A, vitamin C, and other nutrient-rich foods to help the wound heal. ? Foods rich in protein include meat, dairy, beans, nuts, and other sources. ? Foods rich in vitamin A include carrots and dark green, leafy vegetables. ? Foods rich in vitamin C include citrus, tomatoes, and other fruits and vegetables. ? Nutrient-rich foods have protein, carbohydrates, fat, vitamins, or minerals. Eat a variety of healthy foods including vegetables, fruits, and whole grains. Contact a health care provider if:  You received a tetanus shot and you have swelling, severe pain, redness, or bleeding at the injection site.  Your pain is not controlled with medicine.  You have redness, swelling, or pain around the wound.    You have fluid or blood coming from the wound.  Your wound feels warm to the touch.  You have pus or a bad smell coming from the wound.  You have a fever or chills.  You are nauseous or you vomit.  You are dizzy. Get help right away if:  You have a red streak going away from your wound.  The edges of the wound open up and separate.  Your wound is bleeding, and the bleeding does not stop with gentle pressure.  You have a rash.  You faint.  You have trouble  breathing. Summary  Always wash your hands with soap and water before changing your bandage (dressing).  To help with healing, eat foods that are rich in protein, vitamin A, vitamin C, and other nutrients.  Check your wound every day for signs of infection. Contact your health care provider if you suspect that your wound is infected. This information is not intended to replace advice given to you by your health care provider. Make sure you discuss any questions you have with your health care provider. Document Revised: 03/24/2019 Document Reviewed: 06/20/2016 Elsevier Patient Education  2020 [employer]  

## 2020-01-29 ENCOUNTER — Telehealth: Payer: Self-pay | Admitting: Family Medicine

## 2020-01-29 LAB — BMP8+EGFR
BUN/Creatinine Ratio: 12 (ref 9–20)
BUN: 15 mg/dL (ref 6–24)
CO2: 26 mmol/L (ref 20–29)
Calcium: 9.4 mg/dL (ref 8.7–10.2)
Chloride: 98 mmol/L (ref 96–106)
Creatinine, Ser: 1.28 mg/dL — ABNORMAL HIGH (ref 0.76–1.27)
GFR calc Af Amer: 74 mL/min/{1.73_m2} (ref >59)
GFR calc non Af Amer: 64 mL/min/{1.73_m2} (ref >59)
Glucose: 113 mg/dL — ABNORMAL HIGH (ref 65–99)
Potassium: 4.4 mmol/L (ref 3.5–5.2)
Sodium: 139 mmol/L (ref 134–144)

## 2020-01-29 NOTE — Telephone Encounter (Signed)
Patient aware of labs.  

## 2020-02-20 ENCOUNTER — Other Ambulatory Visit: Payer: Self-pay | Admitting: *Deleted

## 2020-02-20 DIAGNOSIS — E119 Type 2 diabetes mellitus without complications: Secondary | ICD-10-CM

## 2020-02-20 DIAGNOSIS — E1169 Type 2 diabetes mellitus with other specified complication: Secondary | ICD-10-CM

## 2020-02-20 MED ORDER — METFORMIN HCL 500 MG PO TABS: 500.0000 mg | ORAL_TABLET | Freq: Every day | ORAL | 0 refills | Status: DC

## 2020-02-20 MED ORDER — METOPROLOL SUCCINATE ER 50 MG PO TB24: 50.0000 mg | ORAL_TABLET | Freq: Two times a day (BID) | ORAL | 1 refills | Status: DC

## 2020-02-20 MED ORDER — ATORVASTATIN CALCIUM 40 MG PO TABS: 40.0000 mg | ORAL_TABLET | Freq: Every day | ORAL | 1 refills | Status: DC

## 2020-02-24 ENCOUNTER — Other Ambulatory Visit: Payer: Self-pay | Admitting: Family Medicine

## 2020-02-24 DIAGNOSIS — E039 Hypothyroidism, unspecified: Secondary | ICD-10-CM

## 2020-02-26 ENCOUNTER — Encounter: Payer: Self-pay | Admitting: Family Medicine

## 2020-03-02 ENCOUNTER — Encounter: Payer: Self-pay | Admitting: *Deleted

## 2020-03-02 ENCOUNTER — Telehealth: Payer: Self-pay

## 2020-03-02 ENCOUNTER — Other Ambulatory Visit: Payer: Self-pay | Admitting: Family Medicine

## 2020-03-02 NOTE — Telephone Encounter (Signed)
LMOVM for pt to call back and make video visit with [facility] for sleep apnea and order his CPAP supplies

## 2020-03-02 NOTE — Telephone Encounter (Signed)
°  Medication Request  03/02/2020  What is the name of the medication? Needs RX to get his C-PAP supplies  Have you contacted your pharmacy to request a refill? Yes  Which pharmacy would you like this sent to? [place]   Patient notified that their request is being sent to the clinical staff for review and that they should receive a call once it is complete. If they do not receive a call within 24 hours they can check with their pharmacy or our office.

## 2020-03-02 NOTE — Telephone Encounter (Signed)
Pt called stating that he is not sure where he had his sleepy study done, but says it was someone in [facility]. Pt says he only needs Rx for CPAP supplies. Unsure of where Rx can be sent to that will accept his insurance because he's gone to [facility] for so long. Doesn't want to travel far if possible. Pt says he can also just pick up the Rx.

## 2020-03-08 ENCOUNTER — Other Ambulatory Visit: Payer: Self-pay | Admitting: Family Medicine

## 2020-03-25 ENCOUNTER — Encounter: Payer: Self-pay | Admitting: Family Medicine

## 2020-03-25 ENCOUNTER — Ambulatory Visit (INDEPENDENT_AMBULATORY_CARE_PROVIDER_SITE_OTHER): Payer: 59 | Admitting: Family Medicine

## 2020-03-25 ENCOUNTER — Other Ambulatory Visit: Payer: Self-pay

## 2020-03-25 VITALS — BP 138/76 | HR 98 | Temp 97.5°F | Ht 73.0 in | Wt 288.8 lb

## 2020-03-25 DIAGNOSIS — I10 Essential (primary) hypertension: Secondary | ICD-10-CM

## 2020-03-25 DIAGNOSIS — E119 Type 2 diabetes mellitus without complications: Secondary | ICD-10-CM

## 2020-03-25 DIAGNOSIS — E291 Testicular hypofunction: Secondary | ICD-10-CM | POA: Diagnosis not present

## 2020-03-25 DIAGNOSIS — E1169 Type 2 diabetes mellitus with other specified complication: Secondary | ICD-10-CM

## 2020-03-25 DIAGNOSIS — G4733 Obstructive sleep apnea (adult) (pediatric): Secondary | ICD-10-CM

## 2020-03-25 DIAGNOSIS — E785 Hyperlipidemia, unspecified: Secondary | ICD-10-CM

## 2020-03-25 DIAGNOSIS — E039 Hypothyroidism, unspecified: Secondary | ICD-10-CM | POA: Diagnosis not present

## 2020-03-25 DIAGNOSIS — E1159 Type 2 diabetes mellitus with other circulatory complications: Secondary | ICD-10-CM

## 2020-03-25 DIAGNOSIS — I152 Hypertension secondary to endocrine disorders: Secondary | ICD-10-CM

## 2020-03-25 LAB — BAYER DCA HB A1C WAIVED: HB A1C (BAYER DCA - WAIVED): 6.7 % (ref <7.0)

## 2020-03-25 MED ORDER — METOPROLOL SUCCINATE ER 50 MG PO TB24: 50.0000 mg | ORAL_TABLET | Freq: Two times a day (BID) | ORAL | 1 refills | Status: DC

## 2020-03-25 MED ORDER — METFORMIN HCL 500 MG PO TABS: 500.0000 mg | ORAL_TABLET | Freq: Every day | ORAL | 0 refills | Status: DC

## 2020-03-25 MED ORDER — ATORVASTATIN CALCIUM 40 MG PO TABS: 40.0000 mg | ORAL_TABLET | Freq: Every day | ORAL | 1 refills | Status: DC

## 2020-03-25 MED ORDER — FENOFIBRATE 160 MG PO TABS: ORAL_TABLET | ORAL | 2 refills | Status: DC

## 2020-03-25 MED ORDER — LEVOTHYROXINE SODIUM 50 MCG PO TABS: ORAL_TABLET | ORAL | 1 refills | Status: DC

## 2020-03-25 MED ORDER — LOSARTAN POTASSIUM-HCTZ 50-12.5 MG PO TABS: ORAL_TABLET | ORAL | 1 refills | Status: DC

## 2020-03-25 MED ORDER — TESTOSTERONE CYPIONATE 200 MG/ML IM SOLN: INTRAMUSCULAR | 1 refills | Status: DC

## 2020-03-25 NOTE — Progress Notes (Signed)
Subjective:  Patient ID: James [name], male    DOB: Jan 17, 1966, 54 y.o.   MRN: [id number]  Patient Care Team: Claretta Fraise, MD as PCP - General (Family Medicine)   Chief Complaint:  Medical Management of Chronic Issues, Hypothyroidism, Diabetes, Hyperlipidemia, and Hypertension   HPI: James [name] is a 54 y.o. male presenting on 03/25/2020 for Medical Management of Chronic Issues, Hypothyroidism, Diabetes, Hyperlipidemia, and Hypertension  1. Hypertension associated with diabetes ([place]) Complaint with meds - Yes Current Medications - Hyzaar, metoprolol Checking BP at home ranging 120-130/70-80 Exercising Regularly - Yes Watching Salt intake - Yes Pertinent ROS:  Headache - No Fatigue - No Visual Disturbances - No Chest pain - No Dyspnea - No Palpitations - No LE edema - No They report good compliance with medications and can restate their regimen by memory. No medication side effects.  Family, social, and smoking history reviewed.   BP Readings from Last 3 Encounters:  03/25/20 138/76  01/28/20 137/78  01/19/20 (!) 152/78   CMP Latest Ref Rng & Units 01/28/2020 01/19/2020 12/15/2019  Glucose 65 - 99 mg/dL 113(H) 163(H) 136(H)  BUN 6 - 24 mg/dL _0 Creatinine 0.76 - 1.27 mg/dL 1.28(H) 1.39(H) 1.29(H)  Sodium 134 - 144 mmol/L 139 142 135  Potassium 3.5 - 5.2 mmol/L 4.4 4.5 4.8  Chloride 96 - 106 mmol/L 98 101 98  CO2 20 - 29 mmol/L _1 Calcium 8.7 - 10.2 mg/dL 9.4 9.5 9.2  Total Protein 6.0 - 8.5 g/dL - 6.9 -  Total Bilirubin 0.0 - 1.2 mg/dL - 0.9 -  Alkaline Phos 39 - 117 IU/L - 63 -  AST 0 - 40 IU/L - 19 -  ALT 0 - 44 IU/L - 48(H) -      2. Hypogonadism in male Has been on testosterone therapy and is tolerating well. Denies mood swings, anger, acne, insomnia, weight changes, diaphoresis, oral lesions, leg swelling, or arthralgias.   3. Acquired hypothyroidism Compliant with medications - Yes Current medications - Synthroid 50 mcg Adverse side  effects - No Weight - stable  Bowel habit changes - No Heat or cold intolerance - No Mood changes - No Changes in sleep habits - No Fatigue - No Skin, hair, or nail changes - No Tremor - No Palpitations - No Edema - No Shortness of breath - No  Lab Results  Component Value Date   TSH 3.310 10/01/2019     4. Type 2 diabetes mellitus without complication, without long-term current use of insulin (HCC) Pt presents for follow up evaluation of Type 2 diabetes mellitus.  Current symptoms include none. Patient denies foot ulcerations, hyperglycemia, hypoglycemia , increased appetite, nausea, paresthesia of the feet, polydipsia, polyuria, visual disturbances, vomiting and weight loss.  Current diabetic medications include metformin Compliant with meds - Yes  Current monitoring regimen: home blood tests - 2-3 times daily Home blood sugar records: forgot to bring today Any episodes of hypoglycemia? no  Known diabetic complications: cardiovascular disease Cardiovascular risk factors: diabetes mellitus, dyslipidemia, hypertension, male gender and obesity (BMI >= 30 kg/m2) Eye exam current (within one year): yes Podiatry yearly?  No Weight trend: stable Current diet: in general, a "healthy" diet   Current exercise: cardiovascular workout on exercise equipment, walking and weightlifting  PNA Vaccine UTD?  No Hep B Vaccine?  Yes Tdap Vaccine UTD?  Yes Urine microalbumin UTD? No  Is He on ACE inhibitor or angiotensin II receptor blocker?  Yes, losartan Is He on statin? Yes atorvastatin Is He on ASA 81 mg daily?  No    5. Hyperlipidemia associated with type 2 diabetes mellitus ([place]) Compliant with medications - Yes Current medications - atorvastatin, fenofibrate Side effects from medications - No Diet - generally healthy Exercise - regular  Lab Results  Component Value Date   CHOL 112 01/19/2020   HDL 25 (L) 01/19/2020   LDLCALC 50 01/19/2020   TRIG 230 (H) 01/19/2020    CHOLHDL 4.5 01/19/2020     Family and personal medical history reviewed. Smoking and ETOH history reviewed.    6. Obstructive sleep apnea On CPAP and tolerating well. States he needs new CPAP supplies and would like a prescription for this today. States current settings are working well.      Relevant past medical, surgical, family, and social history reviewed and updated as indicated.  Allergies and medications reviewed and updated. Date reviewed: Chart in Epic.   Past Medical History:  Diagnosis Date  . Hyperlipidemia   . Hypertension     Past Surgical History:  Procedure Laterality Date  . BACK SURGERY    . TONSILLECTOMY      Social History   Socioeconomic History  . Marital status: Married    Spouse name: Not on file  . Number of children: Not on file  . Years of education: Not on file  . Highest education level: Not on file  Occupational History  . Not on file  Tobacco Use  . Smoking status: Never Smoker  . Smokeless tobacco: Never Used  Substance and Sexual Activity  . Alcohol use: No  . Drug use: No  . Sexual activity: Yes    Birth control/protection: None  Other Topics Concern  . Not on file  Social History Narrative  . Not on file   Social Determinants of Health   Financial Resource Strain:   . Difficulty of Paying Living Expenses:   Food Insecurity:   . Worried About Charity fundraiser in the Last Year:   . Arboriculturist in the Last Year:   Transportation Needs:   . Film/video editor (Medical):   Marland Kitchen Lack of Transportation (Non-Medical):   Physical Activity:   . Days of Exercise per Week:   . Minutes of Exercise per Session:   Stress:   . Feeling of Stress :   Social Connections:   . Frequency of Communication with Friends and Family:   . Frequency of Social Gatherings with Friends and Family:   . Attends Religious Services:   . Active Member of Clubs or Organizations:   . Attends Archivist Meetings:   Marland Kitchen Marital Status:    Intimate Partner Violence:   . Fear of Current or Ex-Partner:   . Emotionally Abused:   Marland Kitchen Physically Abused:   . Sexually Abused:     Outpatient Encounter Medications as of 03/25/2020  Medication Sig  . atorvastatin (LIPITOR) 40 MG tablet Take 1 tablet (40 mg total) by mouth daily.  . blood glucose meter kit and supplies Dispense based on patient and insurance preference. Use up to four times daily as directed. (FOR ICD-10 E10.9, E11.9).  . cyanocobalamin 100 MCG tablet Take 100 mcg by mouth daily.  . fenofibrate 160 MG tablet TAKE ONE (1) TABLET EACH DAY  . levothyroxine (SYNTHROID) 50 MCG tablet TAKE ONE (1) TABLET EACH DAY  . losartan-hydrochlorothiazide (HYZAAR) 50-12.5 MG tablet TAKE ONE (1) TABLET EACH DAY  . metFORMIN (  GLUCOPHAGE) 500 MG tablet Take 1 tablet (500 mg total) by mouth daily with breakfast.  . metoprolol succinate (TOPROL-XL) 50 MG 24 hr tablet Take 1 tablet (50 mg total) by mouth 2 (two) times daily. Take with or immediately following a meal.  . testosterone cypionate (DEPOTESTOSTERONE CYPIONATE) 200 MG/ML injection INJECT 0.75ML IM EVERY 7 DAYS  . therapeutic multivitamin-minerals (THERAGRAN-M) tablet Take by mouth.  . Turmeric 500 MG TABS Take by mouth.  . [DISCONTINUED] atorvastatin (LIPITOR) 40 MG tablet Take 1 tablet (40 mg total) by mouth daily.  . [DISCONTINUED] fenofibrate 160 MG tablet TAKE ONE (1) TABLET EACH DAY  . [DISCONTINUED] levothyroxine (SYNTHROID) 50 MCG tablet TAKE ONE (1) TABLET EACH DAY  . [DISCONTINUED] losartan-hydrochlorothiazide (HYZAAR) 50-12.5 MG tablet TAKE ONE (1) TABLET EACH DAY  . [DISCONTINUED] metFORMIN (GLUCOPHAGE) 500 MG tablet Take 1 tablet (500 mg total) by mouth daily with breakfast.  . [DISCONTINUED] metoprolol succinate (TOPROL-XL) 50 MG 24 hr tablet Take 1 tablet (50 mg total) by mouth 2 (two) times daily. Take with or immediately following a meal.  . [DISCONTINUED] testosterone cypionate (DEPOTESTOSTERONE CYPIONATE) 200 MG/ML  injection INJECT 0.75ML IM EVERY 7 DAYS   No facility-administered encounter medications on file as of 03/25/2020.    No Known Allergies  Review of Systems  Constitutional: Negative for activity change, appetite change, chills, diaphoresis, fatigue, fever and unexpected weight change.  HENT: Negative.   Eyes: Negative.   Respiratory: Negative for cough, chest tightness and shortness of breath.   Cardiovascular: Negative for chest pain, palpitations and leg swelling.  Gastrointestinal: Negative for abdominal pain, blood in stool, constipation, diarrhea, nausea and vomiting.  Endocrine: Negative.  Negative for cold intolerance, heat intolerance, polydipsia, polyphagia and polyuria.  Genitourinary: Negative for decreased urine volume, difficulty urinating, dysuria, frequency and urgency.  Musculoskeletal: Negative for arthralgias and myalgias.  Skin: Negative.   Allergic/Immunologic: Negative.   Neurological: Negative for dizziness, tremors, seizures, syncope, facial asymmetry, speech difficulty, weakness, light-headedness, numbness and headaches.  Hematological: Negative.   Psychiatric/Behavioral: Negative for confusion, hallucinations, sleep disturbance and suicidal ideas.  All other systems reviewed and are negative.       Objective:  BP 138/76 (BP Location: Left Arm, Cuff Size: Large)   Pulse 98   Temp (!) 97.5 F (36.4 C)   Ht '6\' 1"'$  (1.854 m)   Wt 288 lb 12.8 oz (131 kg)   SpO2 95%   BMI 38.10 kg/m    Wt Readings from Last 3 Encounters:  03/25/20 288 lb 12.8 oz (131 kg)  01/28/20 276 lb (125.2 kg)  01/19/20 273 lb (123.8 kg)    Physical Exam Vitals and nursing note reviewed.  Constitutional:      General: He is not in acute distress.    Appearance: Normal appearance. He is well-developed and well-groomed. He is obese. He is not ill-appearing, toxic-appearing or diaphoretic.  HENT:     Head: Normocephalic and atraumatic.     Jaw: There is normal jaw occlusion.      Right Ear: Hearing normal.     Left Ear: Hearing normal.     Nose: Nose normal.     Mouth/Throat:     Lips: Pink.     Mouth: Mucous membranes are moist.     Pharynx: Oropharynx is clear. Uvula midline.  Eyes:     General: Lids are normal.     Extraocular Movements: Extraocular movements intact.     Conjunctiva/sclera: Conjunctivae normal.     Pupils: Pupils are equal,  round, and reactive to light.  Neck:     Thyroid: No thyroid mass, thyromegaly or thyroid tenderness.     Vascular: No carotid bruit or JVD.     Trachea: Trachea and phonation normal.  Cardiovascular:     Rate and Rhythm: Normal rate and regular rhythm.     Chest Wall: PMI is not displaced.     Pulses: Normal pulses.     Heart sounds: Normal heart sounds. No murmur. No friction rub. No gallop.   Pulmonary:     Effort: Pulmonary effort is normal. No respiratory distress.     Breath sounds: Normal breath sounds. No wheezing.  Abdominal:     General: Bowel sounds are normal. There is no distension or abdominal bruit.     Palpations: Abdomen is soft. There is no hepatomegaly or splenomegaly.     Tenderness: There is no abdominal tenderness. There is no right CVA tenderness or left CVA tenderness.     Hernia: No hernia is present.  Musculoskeletal:        General: Normal range of motion.     Cervical back: Normal range of motion and neck supple.     Right lower leg: No edema.     Left lower leg: No edema.  Lymphadenopathy:     Cervical: No cervical adenopathy.  Skin:    General: Skin is warm and dry.     Capillary Refill: Capillary refill takes less than 2 seconds.     Coloration: Skin is not cyanotic, jaundiced or pale.     Findings: No rash.  Neurological:     General: No focal deficit present.     Mental Status: He is alert and oriented to person, place, and time.     Cranial Nerves: Cranial nerves are intact. No cranial nerve deficit.     Sensory: Sensation is intact. No sensory deficit.     Motor: Motor  function is intact. No weakness.     Coordination: Coordination is intact. Coordination normal.     Gait: Gait is intact. Gait normal.     Deep Tendon Reflexes: Reflexes are normal and symmetric. Reflexes normal.  Psychiatric:        Attention and Perception: Attention and perception normal.        Mood and Affect: Mood and affect normal.        Speech: Speech normal.        Behavior: Behavior normal. Behavior is cooperative.        Thought Content: Thought content normal.        Cognition and Memory: Cognition and memory normal.        Judgment: Judgment normal.     Results for orders placed or performed in visit on 01/28/20  [facility]  Result Value Ref Range   Glucose 113 (H) 65 - 99 mg/dL   BUN 15 6 - 24 mg/dL   Creatinine, Ser 1.28 (H) 0.76 - 1.27 mg/dL   GFR calc non Af Amer 64 >59 mL/min/1.73   GFR calc Af Amer 74 >59 mL/min/1.73   BUN/Creatinine Ratio 12 9 - 20   Sodium 139 134 - 144 mmol/L   Potassium 4.4 3.5 - 5.2 mmol/L   Chloride 98 96 - 106 mmol/L   CO2 26 20 - 29 mmol/L   Calcium 9.4 8.7 - 10.2 mg/dL       Pertinent labs & imaging results that were available during my care of the patient were reviewed by me and considered in my  medical decision making.  Assessment & Plan:  Chanc was seen today for medical management of chronic issues, hypothyroidism, diabetes, hyperlipidemia and hypertension.  Diagnoses and all orders for this visit:  Hypertension associated with diabetes ([place]) BP well controlled. Changes were not made in regimen today. Goal BP is 130/80. Pt aware to report any persistent high or low readings. DASH diet and exercise encouraged. Exercise at least 150 minutes per week and increase as tolerated. Goal BMI > 25. Stress management encouraged. Avoid nicotine and tobacco product use. Avoid excessive alcohol and NSAID's. Avoid more than 2000 mg of sodium daily. Medications as prescribed. Follow up as scheduled.  -     Thyroid Panel With TSH -     Lipid  panel -     CMP14+EGFR -     CBC with Differential/Platelet -     losartan-hydrochlorothiazide (HYZAAR) 50-12.5 MG tablet; TAKE ONE (1) TABLET EACH DAY -     metoprolol succinate (TOPROL-XL) 50 MG 24 hr tablet; Take 1 tablet (50 mg total) by mouth 2 (two) times daily. Take with or immediately following a meal.  Hypogonadism in male Doing well on current regimen. Labs pending. Will change therapy if warranted.  -     Testosterone,Free and Total -     testosterone cypionate (DEPOTESTOSTERONE CYPIONATE) 200 MG/ML injection; INJECT 0.75ML IM EVERY 7 DAYS  Acquired hypothyroidism Thyroid disease has been well controlled. Labs are pending. Adjustments to regimen will be made if warranted. Make sure to take medications on an empty stomach with a full glass of water. Make sure to avoid vitamins or supplements for at least 4 hours before and 4 hours after taking medications. Repeat labs in 3 months if adjustments are made and in 6 months if stable.   -     Thyroid Panel With TSH -     levothyroxine (SYNTHROID) 50 MCG tablet; TAKE ONE (1) TABLET EACH DAY  Type 2 diabetes mellitus without complication, without long-term current use of insulin (HCC) A1C 6.7. no changes in regimen. Diet and exercise encouraged. Continue below.  -     CMP14+EGFR -     CBC with Differential/Platelet -     Bayer DCA Hb A1c Waived -     metFORMIN (GLUCOPHAGE) 500 MG tablet; Take 1 tablet (500 mg total) by mouth daily with breakfast.  Hyperlipidemia associated with type 2 diabetes mellitus ([place]) Diet encouraged - increase intake of fresh fruits and vegetables, increase intake of lean proteins. Bake, broil, or grill foods. Avoid fried, greasy, and fatty foods. Avoid fast foods. Increase intake of fiber-rich whole grains. Exercise encouraged - at least 150 minutes per week and advance as tolerated.  Goal BMI < 25. Continue medications as prescribed. Follow up in 3-6 months as discussed.  -     Lipid panel -     CMP14+EGFR -      atorvastatin (LIPITOR) 40 MG tablet; Take 1 tablet (40 mg total) by mouth daily. -     fenofibrate 160 MG tablet; TAKE ONE (1) TABLET EACH DAY  Obstructive sleep apnea Doing well on current settings. Does need new supplies, will write for today. Pt will follow up with sleep medicine for a new evaluation. Will let office know if he needs new referral.  -     For home use only DME Other see comment     Continue all other maintenance medications.  Follow up plan: Return in about 3 months (around 06/24/2020), or if symptoms worsen or fail  to improve, for DM.    Continue healthy lifestyle choices, including diet (rich in fruits, vegetables, and lean proteins, and low in salt and simple carbohydrates) and exercise (at least 30 minutes of moderate physical activity daily).  Educational handout given for DM  The above assessment and management plan was discussed with the patient. The patient verbalized understanding of and has agreed to the management plan. Patient is aware to call the clinic if they develop any new symptoms or if symptoms persist or worsen. Patient is aware when to return to the clinic for a follow-up visit. Patient educated on when it is appropriate to go to the emergency department.   Monia Pouch, FNP-C [facility] [phone]

## 2020-03-25 NOTE — Patient Instructions (Signed)

## 2020-03-27 LAB — CBC WITH DIFFERENTIAL/PLATELET
Basophils Absolute: 0.1 10*3/uL (ref 0.0–0.2)
Basos: 1 %
EOS (ABSOLUTE): 0.3 10*3/uL (ref 0.0–0.4)
Eos: 3 %
Hematocrit: 55.4 % — ABNORMAL HIGH (ref 37.5–51.0)
Hemoglobin: 18.4 g/dL — ABNORMAL HIGH (ref 13.0–17.7)
Immature Grans (Abs): 0 10*3/uL (ref 0.0–0.1)
Immature Granulocytes: 0 %
Lymphocytes Absolute: 1.7 10*3/uL (ref 0.7–3.1)
Lymphs: 17 %
MCH: 31.1 pg (ref 26.6–33.0)
MCHC: 33.2 g/dL (ref 31.5–35.7)
MCV: 94 fL (ref 79–97)
Monocytes Absolute: 0.6 10*3/uL (ref 0.1–0.9)
Monocytes: 6 %
Neutrophils Absolute: 7.1 10*3/uL — ABNORMAL HIGH (ref 1.4–7.0)
Neutrophils: 73 %
Platelets: 212 10*3/uL (ref 150–450)
RBC: 5.92 x10E6/uL — ABNORMAL HIGH (ref 4.14–5.80)
RDW: 14 % (ref 11.6–15.4)
WBC: 9.9 10*3/uL (ref 3.4–10.8)

## 2020-03-27 LAB — CMP14+EGFR
ALT: 51 IU/L — ABNORMAL HIGH (ref 0–44)
AST: 33 IU/L (ref 0–40)
Albumin/Globulin Ratio: 1.5 (ref 1.2–2.2)
Albumin: 4 g/dL (ref 3.8–4.9)
Alkaline Phosphatase: 63 IU/L (ref 39–117)
BUN/Creatinine Ratio: 9 (ref 9–20)
BUN: 12 mg/dL (ref 6–24)
Bilirubin Total: 0.7 mg/dL (ref 0.0–1.2)
CO2: 24 mmol/L (ref 20–29)
Calcium: 9.8 mg/dL (ref 8.7–10.2)
Chloride: 95 mmol/L — ABNORMAL LOW (ref 96–106)
Creatinine, Ser: 1.33 mg/dL — ABNORMAL HIGH (ref 0.76–1.27)
GFR calc Af Amer: 71 mL/min/{1.73_m2} (ref >59)
GFR calc non Af Amer: 61 mL/min/{1.73_m2} (ref >59)
Globulin, Total: 2.7 g/dL (ref 1.5–4.5)
Glucose: 222 mg/dL — ABNORMAL HIGH (ref 65–99)
Potassium: 4 mmol/L (ref 3.5–5.2)
Sodium: 137 mmol/L (ref 134–144)
Total Protein: 6.7 g/dL (ref 6.0–8.5)

## 2020-03-27 LAB — LIPID PANEL
Chol/HDL Ratio: 5 ratio (ref 0.0–5.0)
Cholesterol, Total: 85 mg/dL — ABNORMAL LOW (ref 100–199)
HDL: 17 mg/dL — ABNORMAL LOW (ref >39)
LDL Chol Calc (NIH): 7 mg/dL (ref 0–99)
Triglycerides: 461 mg/dL — ABNORMAL HIGH (ref 0–149)
VLDL Cholesterol Cal: 61 mg/dL — ABNORMAL HIGH (ref 5–40)

## 2020-03-27 LAB — TESTOSTERONE,FREE AND TOTAL
Testosterone, Free: 16.6 pg/mL (ref 7.2–24.0)
Testosterone: 686 ng/dL (ref 264–916)

## 2020-03-27 LAB — THYROID PANEL WITH TSH
Free Thyroxine Index: 1.6 (ref 1.2–4.9)
T3 Uptake Ratio: 28 % (ref 24–39)
T4, Total: 5.6 ug/dL (ref 4.5–12.0)
TSH: 3.39 u[IU]/mL (ref 0.450–4.500)

## 2020-03-28 NOTE — Progress Notes (Signed)
Testosterone normal. Continue current regimen. Thyroid function normal.  Triglycerides very high at 461. Limit intake of fried, greasy, fatty, and fast foods. Continue current medications with diet and exercise.  Glucose was high at 222. Medications along with diet and exercise.  Renal function is declined. Need to make sure you are drinking plenty of water and avoiding workout supplements. ALT slightly elevated. Avoid excessive tylenol and alcohol. Hgb and Hct remain slightly elevated from testosterone replacement. Will continue to monitor.

## 2020-03-31 ENCOUNTER — Ambulatory Visit: Payer: BC Managed Care – PPO | Admitting: Family Medicine

## 2020-06-23 ENCOUNTER — Other Ambulatory Visit: Payer: Self-pay

## 2020-06-23 ENCOUNTER — Ambulatory Visit (INDEPENDENT_AMBULATORY_CARE_PROVIDER_SITE_OTHER): Payer: 59 | Admitting: Family Medicine

## 2020-06-23 ENCOUNTER — Encounter: Payer: Self-pay | Admitting: Family Medicine

## 2020-06-23 VITALS — BP 152/89 | HR 87 | Temp 97.8°F | Resp 20 | Ht 73.0 in | Wt 278.1 lb

## 2020-06-23 DIAGNOSIS — E039 Hypothyroidism, unspecified: Secondary | ICD-10-CM | POA: Diagnosis not present

## 2020-06-23 DIAGNOSIS — E1169 Type 2 diabetes mellitus with other specified complication: Secondary | ICD-10-CM

## 2020-06-23 DIAGNOSIS — E1159 Type 2 diabetes mellitus with other circulatory complications: Secondary | ICD-10-CM | POA: Diagnosis not present

## 2020-06-23 DIAGNOSIS — I10 Essential (primary) hypertension: Secondary | ICD-10-CM

## 2020-06-23 DIAGNOSIS — E785 Hyperlipidemia, unspecified: Secondary | ICD-10-CM

## 2020-06-23 DIAGNOSIS — G4733 Obstructive sleep apnea (adult) (pediatric): Secondary | ICD-10-CM

## 2020-06-23 DIAGNOSIS — I152 Hypertension secondary to endocrine disorders: Secondary | ICD-10-CM

## 2020-06-23 DIAGNOSIS — E119 Type 2 diabetes mellitus without complications: Secondary | ICD-10-CM

## 2020-06-23 LAB — BAYER DCA HB A1C WAIVED: HB A1C (BAYER DCA - WAIVED): 7.3 % — ABNORMAL HIGH (ref <7.0)

## 2020-06-23 NOTE — Progress Notes (Signed)
Subjective:  Patient ID: James [name], male    DOB: Nov 25, 1966  Age: 54 y.o. MRN: [id number]  CC: Medical Management of Chronic Issues   HPI James [name] presents forFollow-up of diabetes. Patient checks blood sugar at home.   100 fasting and 140 postprandial Patient denies symptoms such as polyuria, polydipsia, excessive hunger, nausea No significant hypoglycemic spells noted. Medications reviewed. Pt reports taking them regularly without complication/adverse reaction being reported today.     follow-up on  thyroid. The patient has a history of hypothyroidism for many years. It has been stable recently. Pt. denies any change in  voice, loss of hair, heat or cold intolerance. Energy level has been adequate to good. Patient denies constipation and diarrhea. No myxedema. Medication is as noted below. Verified that pt is taking it daily on an empty stomach. Well tolerated.   presents for  follow-up of hypertension. Patient has no history of headache chest pain or shortness of breath or recent cough. Patient also denies symptoms of TIA such as focal numbness or weakness. Patient denies side effects from medication. States taking it regularly.   in for follow-up of elevated cholesterol. Doing well without complaints on current medication. Denies side effects of statin including myalgia and arthralgia and nausea. Currently no chest pain, shortness of breath or other cardiovascular related symptoms noted.   History James [name] has a past medical history of Hyperlipidemia and Hypertension.   He has a past surgical history that includes Back surgery and Tonsillectomy.   His family history includes Alzheimer's disease in his father; COPD in his mother.He reports that he has never smoked. He has never used smokeless tobacco. He reports that he does not drink alcohol and does not use drugs.  Current Outpatient Medications on File Prior to Visit  Medication Sig Dispense Refill  . atorvastatin  (LIPITOR) 40 MG tablet Take 1 tablet (40 mg total) by mouth daily. 90 tablet 1  . blood glucose meter kit and supplies Dispense based on patient and insurance preference. Use up to four times daily as directed. (FOR ICD-10 E10.9, E11.9). 1 each 0  . levothyroxine (SYNTHROID) 50 MCG tablet TAKE ONE (1) TABLET EACH DAY 90 tablet 1  . losartan-hydrochlorothiazide (HYZAAR) 50-12.5 MG tablet TAKE ONE (1) TABLET EACH DAY 90 tablet 1  . metFORMIN (GLUCOPHAGE) 500 MG tablet Take 1 tablet (500 mg total) by mouth daily with breakfast. 180 tablet 0  . metoprolol succinate (TOPROL-XL) 50 MG 24 hr tablet Take 1 tablet (50 mg total) by mouth 2 (two) times daily. Take with or immediately following a meal. 180 tablet 1  . Omega-3 Fatty Acids (FISH OIL) 1000 MG CAPS Take 1,000 mg by mouth.    . testosterone cypionate (DEPOTESTOSTERONE CYPIONATE) 200 MG/ML injection INJECT 0.75ML IM EVERY 7 DAYS 10 mL 1  . Turmeric 500 MG TABS Take by mouth.    . therapeutic multivitamin-minerals (THERAGRAN-M) tablet Take by mouth. (Patient not taking: Reported on 06/23/2020)     No current facility-administered medications on file prior to visit.    ROS Review of Systems  Constitutional: Negative.   HENT: Negative.   Eyes: Negative for visual disturbance.  Respiratory: Negative for cough and shortness of breath.   Cardiovascular: Negative for chest pain and leg swelling.  Gastrointestinal: Negative for abdominal pain, diarrhea, nausea and vomiting.  Genitourinary: Negative for difficulty urinating.  Musculoskeletal: Negative for arthralgias and myalgias.  Skin: Negative for rash.  Neurological: Negative for headaches.  Psychiatric/Behavioral: Negative for sleep disturbance.  Objective:  BP (!) 152/89   Pulse 87   Temp 97.8 F (36.6 C) (Temporal)   Resp 20   Ht _0  (1.854 m)   Wt 278 lb 2 oz (126.2 kg)   SpO2 98%   BMI 36.69 kg/m   BP Readings from Last 3 Encounters:  06/23/20 (!) 152/89  03/25/20 138/76    01/28/20 137/78    Wt Readings from Last 3 Encounters:  06/23/20 278 lb 2 oz (126.2 kg)  03/25/20 288 lb 12.8 oz (131 kg)  01/28/20 276 lb (125.2 kg)     Physical Exam Constitutional:      General: He is not in acute distress.    Appearance: He is well-developed.  HENT:     Head: Normocephalic and atraumatic.     Right Ear: External ear normal.     Left Ear: External ear normal.     Nose: Nose normal.  Eyes:     Conjunctiva/sclera: Conjunctivae normal.     Pupils: Pupils are equal, round, and reactive to light.  Cardiovascular:     Rate and Rhythm: Normal rate and regular rhythm.     Heart sounds: Normal heart sounds. No murmur heard.   Pulmonary:     Effort: Pulmonary effort is normal. No respiratory distress.     Breath sounds: Normal breath sounds. No wheezing or rales.  Abdominal:     Palpations: Abdomen is soft.     Tenderness: There is no abdominal tenderness.  Musculoskeletal:        General: Normal range of motion.     Cervical back: Normal range of motion and neck supple.  Skin:    General: Skin is warm and dry.  Neurological:     Mental Status: He is alert and oriented to person, place, and time.     Deep Tendon Reflexes: Reflexes are normal and symmetric.  Psychiatric:        Behavior: Behavior normal.        Thought Content: Thought content normal.        Judgment: Judgment normal.       Assessment & Plan:   James [name] was seen today for medical management of chronic issues.  Diagnoses and all orders for this visit:  Hypertension associated with diabetes ([place]) -     CBC with Differential/Platelet -     CMP14+EGFR -     Lipid panel  Acquired hypothyroidism -     CBC with Differential/Platelet -     CMP14+EGFR -     Lipid panel -     Thyroid Panel With TSH  Type 2 diabetes mellitus without complication, without long-term current use of insulin (HCC) -     Microalbumin / creatinine urine ratio -     Bayer DCA Hb A1c Waived -     CBC with  Differential/Platelet -     CMP14+EGFR -     Lipid panel  Hyperlipidemia associated with type 2 diabetes mellitus (HCC) -     CBC with Differential/Platelet -     CMP14+EGFR -     Lipid panel  Obstructive sleep apnea -     For home use only DME continuous positive airway pressure (CPAP)      I have discontinued James [name]'s cyanocobalamin. I am also having him maintain his Turmeric, therapeutic multivitamin-minerals, blood glucose meter kit and supplies, atorvastatin, levothyroxine, losartan-hydrochlorothiazide, metFORMIN, metoprolol succinate, testosterone cypionate, and Fish Oil.  No orders of the defined types were placed in this  encounter.    Follow-up: Return in about 3 months (around 09/23/2020).  Claretta Fraise, M.D.

## 2020-06-23 NOTE — Patient Instructions (Signed)
DASH Eating Plan DASH stands for "Dietary Approaches to Stop Hypertension." The DASH eating plan is a healthy eating plan that has been shown to reduce high blood pressure (hypertension). It may also reduce your risk for type 2 diabetes, heart disease, and stroke. The DASH eating plan may also help with weight loss. What are tips for following this plan?  General guidelines  Avoid eating more than 2,300 mg (milligrams) of salt (sodium) a day. If you have hypertension, you may need to reduce your sodium intake to 1,500 mg a day.  Limit alcohol intake to no more than 1 drink a day for nonpregnant women and 2 drinks a day for men. One drink equals 12 oz of beer, 5 oz of wine, or 1 oz of hard liquor.  Work with your health care provider to maintain a healthy body weight or to lose weight. Ask what an ideal weight is for you.  Get at least 30 minutes of exercise that causes your heart to beat faster (aerobic exercise) most days of the week. Activities may include walking, swimming, or biking.  Work with your health care provider or diet and nutrition specialist (dietitian) to adjust your eating plan to your individual calorie needs. Reading food labels   Check food labels for the amount of sodium per serving. Choose foods with less than 5 percent of the Daily Value of sodium. Generally, foods with less than 300 mg of sodium per serving fit into this eating plan.  To find whole grains, look for the word "whole" as the first word in the ingredient list. Shopping  Buy products labeled as "low-sodium" or "no salt added."  Buy fresh foods. Avoid canned foods and premade or frozen meals. Cooking  Avoid adding salt when cooking. Use salt-free seasonings or herbs instead of table salt or sea salt. Check with your health care provider or pharmacist before using salt substitutes.  Do not fry foods. Cook foods using healthy methods such as baking, boiling, grilling, and broiling instead.  Cook with  heart-healthy oils, such as olive, canola, soybean, or sunflower oil. Meal planning  Eat a balanced diet that includes: ? 5 or more servings of fruits and vegetables each day. At each meal, try to fill half of your plate with fruits and vegetables. ? Up to 6-8 servings of whole grains each day. ? Less than 6 oz of lean meat, poultry, or fish each day. A 3-oz serving of meat is about the same size as a deck of cards. One egg equals 1 oz. ? 2 servings of low-fat dairy each day. ? A serving of nuts, seeds, or beans 5 times each week. ? Heart-healthy fats. Healthy fats called Omega-3 fatty acids are found in foods such as flaxseeds and coldwater fish, like sardines, salmon, and mackerel.  Limit how much you eat of the following: ? Canned or prepackaged foods. ? Food that is high in trans fat, such as fried foods. ? Food that is high in saturated fat, such as fatty meat. ? Sweets, desserts, sugary drinks, and other foods with added sugar. ? Full-fat dairy products.  Do not salt foods before eating.  Try to eat at least 2 vegetarian meals each week.  Eat more home-cooked food and less restaurant, buffet, and fast food.  When eating at a restaurant, ask that your food be prepared with less salt or no salt, if possible. What foods are recommended? The items listed may not be a complete list. Talk with your dietitian about   what dietary choices are best for you. Grains Whole-grain or whole-wheat bread. Whole-grain or whole-wheat pasta. Goodrich rice. Oatmeal. Quinoa. Bulgur. Whole-grain and low-sodium cereals. Pita bread. Low-fat, low-sodium crackers. Whole-wheat flour tortillas. Vegetables Fresh or frozen vegetables (raw, steamed, roasted, or grilled). Low-sodium or reduced-sodium tomato and vegetable juice. Low-sodium or reduced-sodium tomato sauce and tomato paste. Low-sodium or reduced-sodium canned vegetables. Fruits All fresh, dried, or frozen fruit. Canned fruit in natural juice (without  added sugar). Meat and other protein foods Skinless chicken or turkey. Ground chicken or turkey. Pork with fat trimmed off. Fish and seafood. Egg whites. Dried beans, peas, or lentils. Unsalted nuts, nut butters, and seeds. Unsalted canned beans. Lean cuts of beef with fat trimmed off. Low-sodium, lean deli meat. Dairy Low-fat (1%) or fat-free (skim) milk. Fat-free, low-fat, or reduced-fat cheeses. Nonfat, low-sodium ricotta or cottage cheese. Low-fat or nonfat yogurt. Low-fat, low-sodium cheese. Fats and oils Soft margarine without trans fats. Vegetable oil. Low-fat, reduced-fat, or light mayonnaise and salad dressings (reduced-sodium). Canola, safflower, olive, soybean, and sunflower oils. Avocado. Seasoning and other foods Herbs. Spices. Seasoning mixes without salt. Unsalted popcorn and pretzels. Fat-free sweets. What foods are not recommended? The items listed may not be a complete list. Talk with your dietitian about what dietary choices are best for you. Grains Baked goods made with fat, such as croissants, muffins, or some breads. Dry pasta or rice meal packs. Vegetables Creamed or fried vegetables. Vegetables in a cheese sauce. Regular canned vegetables (not low-sodium or reduced-sodium). Regular canned tomato sauce and paste (not low-sodium or reduced-sodium). Regular tomato and vegetable juice (not low-sodium or reduced-sodium). Pickles. Olives. Fruits Canned fruit in a light or heavy syrup. Fried fruit. Fruit in cream or butter sauce. Meat and other protein foods Fatty cuts of meat. Ribs. Fried meat. Bacon. Sausage. Bologna and other processed lunch meats. Salami. Fatback. Hotdogs. Bratwurst. Salted nuts and seeds. Canned beans with added salt. Canned or smoked fish. Whole eggs or egg yolks. Chicken or turkey with skin. Dairy Whole or 2% milk, cream, and half-and-half. Whole or full-fat cream cheese. Whole-fat or sweetened yogurt. Full-fat cheese. Nondairy creamers. Whipped toppings.  Processed cheese and cheese spreads. Fats and oils Butter. Stick margarine. Lard. Shortening. Ghee. Bacon fat. Tropical oils, such as coconut, palm kernel, or palm oil. Seasoning and other foods Salted popcorn and pretzels. Onion salt, garlic salt, seasoned salt, table salt, and sea salt. Worcestershire sauce. Tartar sauce. Barbecue sauce. Teriyaki sauce. Soy sauce, including reduced-sodium. Steak sauce. Canned and packaged gravies. Fish sauce. Oyster sauce. Cocktail sauce. Horseradish that you find on the shelf. Ketchup. Mustard. Meat flavorings and tenderizers. Bouillon cubes. Hot sauce and Tabasco sauce. Premade or packaged marinades. Premade or packaged taco seasonings. Relishes. Regular salad dressings. Where to find more information:  National Heart, Lung, and Blood Institute: www.nhlbi.nih.gov  American Heart Association: www.heart.org Summary  The DASH eating plan is a healthy eating plan that has been shown to reduce high blood pressure (hypertension). It may also reduce your risk for type 2 diabetes, heart disease, and stroke.  With the DASH eating plan, you should limit salt (sodium) intake to 2,300 mg a day. If you have hypertension, you may need to reduce your sodium intake to 1,500 mg a day.  When on the DASH eating plan, aim to eat more fresh fruits and vegetables, whole grains, lean proteins, low-fat dairy, and heart-healthy fats.  Work with your health care provider or diet and nutrition specialist (dietitian) to adjust your eating plan to your   individual calorie needs. This information is not intended to replace advice given to you by your health care provider. Make sure you discuss any questions you have with your health care provider. Document Revised: 11/16/2017 Document Reviewed: 11/27/2016 Elsevier Patient Education  2020 Else[employer] Inc.  

## 2020-06-24 ENCOUNTER — Other Ambulatory Visit: Payer: Self-pay | Admitting: Family Medicine

## 2020-06-24 DIAGNOSIS — E1169 Type 2 diabetes mellitus with other specified complication: Secondary | ICD-10-CM

## 2020-06-24 LAB — LIPID PANEL
Chol/HDL Ratio: 5.6 ratio — ABNORMAL HIGH (ref 0.0–5.0)
Cholesterol, Total: 90 mg/dL — ABNORMAL LOW (ref 100–199)
HDL: 16 mg/dL — ABNORMAL LOW (ref >39)
LDL Chol Calc (NIH): 14 mg/dL (ref 0–99)
Triglycerides: 440 mg/dL — ABNORMAL HIGH (ref 0–149)
VLDL Cholesterol Cal: 60 mg/dL — ABNORMAL HIGH (ref 5–40)

## 2020-06-24 LAB — CMP14+EGFR
ALT: 60 IU/L — ABNORMAL HIGH (ref 0–44)
AST: 42 IU/L — ABNORMAL HIGH (ref 0–40)
Albumin/Globulin Ratio: 1.6 (ref 1.2–2.2)
Albumin: 4.2 g/dL (ref 3.8–4.9)
Alkaline Phosphatase: 73 IU/L (ref 48–121)
BUN/Creatinine Ratio: 9 (ref 9–20)
BUN: 11 mg/dL (ref 6–24)
Bilirubin Total: 0.9 mg/dL (ref 0.0–1.2)
CO2: 28 mmol/L (ref 20–29)
Calcium: 9.8 mg/dL (ref 8.7–10.2)
Chloride: 96 mmol/L (ref 96–106)
Creatinine, Ser: 1.19 mg/dL (ref 0.76–1.27)
GFR calc Af Amer: 81 mL/min/{1.73_m2} (ref >59)
GFR calc non Af Amer: 70 mL/min/{1.73_m2} (ref >59)
Globulin, Total: 2.7 g/dL (ref 1.5–4.5)
Glucose: 147 mg/dL — ABNORMAL HIGH (ref 65–99)
Potassium: 4 mmol/L (ref 3.5–5.2)
Sodium: 137 mmol/L (ref 134–144)
Total Protein: 6.9 g/dL (ref 6.0–8.5)

## 2020-06-24 LAB — CBC WITH DIFFERENTIAL/PLATELET
Basophils Absolute: 0.1 10*3/uL (ref 0.0–0.2)
Basos: 1 %
EOS (ABSOLUTE): 0.3 10*3/uL (ref 0.0–0.4)
Eos: 3 %
Hematocrit: 53.4 % — ABNORMAL HIGH (ref 37.5–51.0)
Hemoglobin: 18.5 g/dL — ABNORMAL HIGH (ref 13.0–17.7)
Immature Grans (Abs): 0 10*3/uL (ref 0.0–0.1)
Immature Granulocytes: 0 %
Lymphocytes Absolute: 1.8 10*3/uL (ref 0.7–3.1)
Lymphs: 19 %
MCH: 31.1 pg (ref 26.6–33.0)
MCHC: 34.6 g/dL (ref 31.5–35.7)
MCV: 90 fL (ref 79–97)
Monocytes Absolute: 0.7 10*3/uL (ref 0.1–0.9)
Monocytes: 7 %
Neutrophils Absolute: 6.8 10*3/uL (ref 1.4–7.0)
Neutrophils: 70 %
Platelets: 219 10*3/uL (ref 150–450)
RBC: 5.94 x10E6/uL — ABNORMAL HIGH (ref 4.14–5.80)
RDW: 13 % (ref 11.6–15.4)
WBC: 9.8 10*3/uL (ref 3.4–10.8)

## 2020-06-24 LAB — THYROID PANEL WITH TSH
Free Thyroxine Index: 1.4 (ref 1.2–4.9)
T3 Uptake Ratio: 27 % (ref 24–39)
T4, Total: 5.3 ug/dL (ref 4.5–12.0)
TSH: 2.77 u[IU]/mL (ref 0.450–4.500)

## 2020-06-24 MED ORDER — FENOFIBRATE 160 MG PO TABS: ORAL_TABLET | ORAL | 2 refills | Status: DC

## 2020-06-25 ENCOUNTER — Other Ambulatory Visit: Payer: Self-pay | Admitting: *Deleted

## 2020-06-25 DIAGNOSIS — R748 Abnormal levels of other serum enzymes: Secondary | ICD-10-CM

## 2020-09-08 ENCOUNTER — Other Ambulatory Visit: Payer: 59

## 2020-09-08 ENCOUNTER — Other Ambulatory Visit: Payer: Self-pay

## 2020-09-08 DIAGNOSIS — R748 Abnormal levels of other serum enzymes: Secondary | ICD-10-CM

## 2020-09-08 LAB — CMP14+EGFR
ALT: 50 IU/L — ABNORMAL HIGH (ref 0–44)
AST: 30 IU/L (ref 0–40)
Albumin/Globulin Ratio: 1.6 (ref 1.2–2.2)
Albumin: 4.2 g/dL (ref 3.8–4.9)
Alkaline Phosphatase: 54 IU/L (ref 44–121)
BUN/Creatinine Ratio: 12 (ref 9–20)
BUN: 15 mg/dL (ref 6–24)
Bilirubin Total: 0.7 mg/dL (ref 0.0–1.2)
CO2: 27 mmol/L (ref 20–29)
Calcium: 10 mg/dL (ref 8.7–10.2)
Chloride: 97 mmol/L (ref 96–106)
Creatinine, Ser: 1.27 mg/dL (ref 0.76–1.27)
GFR calc Af Amer: 74 mL/min/{1.73_m2} (ref >59)
GFR calc non Af Amer: 64 mL/min/{1.73_m2} (ref >59)
Globulin, Total: 2.6 g/dL (ref 1.5–4.5)
Glucose: 91 mg/dL (ref 65–99)
Potassium: 4 mmol/L (ref 3.5–5.2)
Sodium: 139 mmol/L (ref 134–144)
Total Protein: 6.8 g/dL (ref 6.0–8.5)

## 2020-09-17 ENCOUNTER — Ambulatory Visit (INDEPENDENT_AMBULATORY_CARE_PROVIDER_SITE_OTHER): Payer: 59 | Admitting: Family Medicine

## 2020-09-17 ENCOUNTER — Encounter: Payer: Self-pay | Admitting: Family Medicine

## 2020-09-17 VITALS — BP 163/85 | HR 89 | Temp 97.6°F | Ht 73.0 in | Wt 280.0 lb

## 2020-09-17 DIAGNOSIS — J069 Acute upper respiratory infection, unspecified: Secondary | ICD-10-CM

## 2020-09-17 MED ORDER — FLUTICASONE PROPIONATE 50 MCG/ACT NA SUSP
1.0000 | Freq: Two times a day (BID) | NASAL | 6 refills | Status: DC | PRN
Start: 2020-09-17 — End: 2021-09-07

## 2020-09-17 MED ORDER — AZITHROMYCIN 250 MG PO TABS
ORAL_TABLET | ORAL | 0 refills | Status: DC
Start: 2020-09-17 — End: 2020-10-05

## 2020-09-17 NOTE — Progress Notes (Signed)
BP (!) 163/85   Pulse 89   Temp 97.6 F (36.4 C)   Ht '6\' 1"'  (1.854 m)   Wt 280 lb (127 kg)   SpO2 96%   BMI 36.94 kg/m    Subjective:   Patient ID: James [name], male    DOB: [birth year], 54 y.o.   MRN: [id number]  HPI: James [name] is a 54 y.o. male presenting on 09/17/2020 for Nasal Congestion, Ear Fullness (L>R), and Fatigue   HPI Patient presents with complaint of left ear fullness fatigue nasal congestion and pressure that is been going on over the past couple days.  He does state that his wife was sick with a week and a half ago. He denies fevers or chills or shortness of breath or wheezing.  He denies any sick contacts.   Relevant past medical, surgical, family and social history reviewed and updated as indicated. Interim medical history since our last visit reviewed. Allergies and medications reviewed and updated.  Review of Systems  Constitutional: Negative for chills and fever.  HENT: Positive for congestion, ear pain, postnasal drip, rhinorrhea and sinus pressure. Negative for ear discharge, sneezing, sore throat and voice change.   Eyes: Negative for pain, discharge, redness and visual disturbance.  Respiratory: Positive for cough. Negative for shortness of breath and wheezing.   Cardiovascular: Negative for chest pain and leg swelling.  Musculoskeletal: Negative for gait problem.  Skin: Negative for rash.  All other systems reviewed and are negative.   Per HPI unless specifically indicated above   Allergies as of 09/17/2020   No Known Allergies     Medication List       Accurate as of September 17, 2020  4:09 PM. If you have any questions, ask your nurse or doctor.        atorvastatin 40 MG tablet Commonly known as: LIPITOR Take 1 tablet (40 mg total) by mouth daily.   blood glucose meter kit and supplies Dispense based on patient and insurance preference. Use up to four times daily as directed. (FOR ICD-10 E10.9, E11.9).   cholecalciferol 25 MCG  (1000 UNIT) tablet Commonly known as: VITAMIN D3 Take 1,000 Units by mouth daily.   fenofibrate 160 MG tablet TAKE ONE (1) TABLET EACH DAY   Fish Oil 1000 MG Caps Take 1,000 mg by mouth.   levothyroxine 50 MCG tablet Commonly known as: SYNTHROID TAKE ONE (1) TABLET EACH DAY   losartan-hydrochlorothiazide 50-12.5 MG tablet Commonly known as: HYZAAR TAKE ONE (1) TABLET EACH DAY   metFORMIN 500 MG tablet Commonly known as: GLUCOPHAGE Take 1 tablet (500 mg total) by mouth daily with breakfast.   metoprolol succinate 50 MG 24 hr tablet Commonly known as: TOPROL-XL Take 1 tablet (50 mg total) by mouth 2 (two) times daily. Take with or immediately following a meal.   testosterone cypionate 200 MG/ML injection Commonly known as: DEPOTESTOSTERONE CYPIONATE INJECT 0.75ML IM EVERY 7 DAYS   therapeutic multivitamin-minerals tablet Take by mouth.   Turmeric 500 MG Tabs Take by mouth.   vitamin B-12 500 MCG tablet Commonly known as: CYANOCOBALAMIN Take 500 mcg by mouth daily.   vitamin C 1000 MG tablet Take 1,000 mg by mouth daily.        Objective:   BP (!) 163/85   Pulse 89   Temp 97.6 F (36.4 C)   Ht '6\' 1"'  (1.854 m)   Wt 280 lb (127 kg)   SpO2 96%   BMI 36.94 kg/m  Wt Readings from Last 3 Encounters:  09/17/20 280 lb (127 kg)  06/23/20 278 lb 2 oz (126.2 kg)  03/25/20 288 lb 12.8 oz (131 kg)    Physical Exam Vitals and nursing note reviewed.  Constitutional:      General: He is not in acute distress.    Appearance: He is well-developed. He is not diaphoretic.  HENT:     Right Ear: Ear canal and external ear normal. No drainage or swelling. A middle ear effusion is present. There is no impacted cerumen.     Left Ear: Ear canal and external ear normal. No drainage or swelling. A middle ear effusion is present. There is no impacted cerumen.     Nose: Mucosal edema and rhinorrhea present.     Right Sinus: Maxillary sinus tenderness present. No frontal sinus  tenderness.     Left Sinus: Maxillary sinus tenderness present. No frontal sinus tenderness.     Mouth/Throat:     Pharynx: Uvula midline. Posterior oropharyngeal erythema present. No oropharyngeal exudate.     Tonsils: No tonsillar abscesses.  Eyes:     General: No scleral icterus.       Right eye: No discharge.     Conjunctiva/sclera: Conjunctivae normal.     Pupils: Pupils are equal, round, and reactive to light.  Neck:     Thyroid: No thyromegaly.  Cardiovascular:     Rate and Rhythm: Normal rate and regular rhythm.     Heart sounds: Normal heart sounds. No murmur heard.   Pulmonary:     Effort: Pulmonary effort is normal. No respiratory distress.     Breath sounds: Normal breath sounds. No wheezing or rales.  Musculoskeletal:        General: Normal range of motion.     Cervical back: Neck supple.  Lymphadenopathy:     Cervical: No cervical adenopathy.  Skin:    General: Skin is warm and dry.     Findings: No rash.  Neurological:     Mental Status: He is alert and oriented to person, place, and time.     Coordination: Coordination normal.  Psychiatric:        Behavior: Behavior normal.       Assessment & Plan:   Problem List Items Addressed This Visit    None    Visit Diagnoses    Upper respiratory tract infection, unspecified type    -  Primary   Relevant Medications   azithromycin (ZITHROMAX) 250 MG tablet   Other Relevant Orders   Novel Coronavirus, NAA ([facility])       Follow up plan: Return if symptoms worsen or fail to improve.  Counseling provided for all of the vaccine components No orders of the defined types were placed in this encounter.   Caryl Pina, MD [facility] 09/17/2020, 4:09 PM

## 2020-09-18 LAB — SARS-COV-2, NAA 2 DAY TAT

## 2020-09-18 LAB — NOVEL CORONAVIRUS, NAA: SARS-CoV-2, NAA: NOT DETECTED

## 2020-09-23 ENCOUNTER — Ambulatory Visit: Payer: 59 | Admitting: Family Medicine

## 2020-09-30 ENCOUNTER — Other Ambulatory Visit: Payer: Self-pay | Admitting: *Deleted

## 2020-09-30 DIAGNOSIS — E291 Testicular hypofunction: Secondary | ICD-10-CM

## 2020-09-30 MED ORDER — TESTOSTERONE CYPIONATE 200 MG/ML IM SOLN: INTRAMUSCULAR | 1 refills | Status: DC

## 2020-10-05 ENCOUNTER — Encounter: Payer: Self-pay | Admitting: Family Medicine

## 2020-10-05 ENCOUNTER — Ambulatory Visit: Payer: 59 | Admitting: Family Medicine

## 2020-10-05 ENCOUNTER — Other Ambulatory Visit: Payer: Self-pay

## 2020-10-05 VITALS — BP 159/94 | HR 80 | Temp 96.7°F | Ht 73.0 in | Wt 284.0 lb

## 2020-10-05 DIAGNOSIS — Z5320 Procedure and treatment not carried out because of patient's decision for unspecified reasons: Secondary | ICD-10-CM

## 2020-10-05 DIAGNOSIS — R748 Abnormal levels of other serum enzymes: Secondary | ICD-10-CM | POA: Diagnosis not present

## 2020-10-05 DIAGNOSIS — E1169 Type 2 diabetes mellitus with other specified complication: Secondary | ICD-10-CM

## 2020-10-05 DIAGNOSIS — E039 Hypothyroidism, unspecified: Secondary | ICD-10-CM | POA: Diagnosis not present

## 2020-10-05 DIAGNOSIS — I152 Hypertension secondary to endocrine disorders: Secondary | ICD-10-CM

## 2020-10-05 DIAGNOSIS — E1159 Type 2 diabetes mellitus with other circulatory complications: Secondary | ICD-10-CM

## 2020-10-05 DIAGNOSIS — Z114 Encounter for screening for human immunodeficiency virus [HIV]: Secondary | ICD-10-CM

## 2020-10-05 DIAGNOSIS — H6592 Unspecified nonsuppurative otitis media, left ear: Secondary | ICD-10-CM

## 2020-10-05 DIAGNOSIS — E291 Testicular hypofunction: Secondary | ICD-10-CM

## 2020-10-05 DIAGNOSIS — E119 Type 2 diabetes mellitus without complications: Secondary | ICD-10-CM

## 2020-10-05 DIAGNOSIS — Z1159 Encounter for screening for other viral diseases: Secondary | ICD-10-CM

## 2020-10-05 DIAGNOSIS — E785 Hyperlipidemia, unspecified: Secondary | ICD-10-CM

## 2020-10-05 LAB — BAYER DCA HB A1C WAIVED: HB A1C (BAYER DCA - WAIVED): 7.2 % — ABNORMAL HIGH (ref <7.0)

## 2020-10-05 MED ORDER — ATORVASTATIN CALCIUM 40 MG PO TABS: 40.0000 mg | ORAL_TABLET | Freq: Every day | ORAL | 1 refills | Status: DC

## 2020-10-05 MED ORDER — VALSARTAN-HYDROCHLOROTHIAZIDE 320-25 MG PO TABS: 1.0000 | ORAL_TABLET | Freq: Every day | ORAL | 2 refills | Status: DC

## 2020-10-05 MED ORDER — AMOXICILLIN-POT CLAVULANATE 875-125 MG PO TABS: 1.0000 | ORAL_TABLET | Freq: Two times a day (BID) | ORAL | 0 refills | Status: DC

## 2020-10-05 MED ORDER — METOPROLOL SUCCINATE ER 100 MG PO TB24: 100.0000 mg | ORAL_TABLET | Freq: Every day | ORAL | 1 refills | Status: DC

## 2020-10-05 MED ORDER — METFORMIN HCL ER 500 MG PO TB24: 500.0000 mg | ORAL_TABLET | Freq: Every day | ORAL | 2 refills | Status: DC

## 2020-10-05 MED ORDER — LEVOTHYROXINE SODIUM 50 MCG PO TABS: ORAL_TABLET | ORAL | 1 refills | Status: DC

## 2020-10-05 NOTE — Progress Notes (Signed)
Subjective:  Patient ID: James [name], male    DOB: 03-08-1966  Age: 54 y.o. MRN: [id number]  CC: Follow-up (3 month)   HPI James [name] presents forFollow-up of diabetes. Patient rarelychecks blood sugar at home. Patient denies symptoms such as polyuria, polydipsia, excessive hunger, nausea No significant hypoglycemic spells noted. Medications reviewed. Pt reports taking them regularly Having a lot of diarrhe.    presents for  follow-up of hypertension. Patient has no history of headache chest pain or shortness of breath or recent cough. Patient also denies symptoms of TIA such as focal numbness or weakness. Patient denies side effects from medication. States taking it regularly.  History James [name] has a past medical history of Hyperlipidemia and Hypertension.   He has a past surgical history that includes Back surgery and Tonsillectomy.   His family history includes Alzheimer's disease in his father; COPD in his mother.He reports that he has never smoked. He has never used smokeless tobacco. He reports that he does not drink alcohol and does not use drugs.  Current Outpatient Medications on File Prior to Visit  Medication Sig Dispense Refill  . Ascorbic Acid (VITAMIN C) 1000 MG tablet Take 1,000 mg by mouth daily.    . blood glucose meter kit and supplies Dispense based on patient and insurance preference. Use up to four times daily as directed. (FOR ICD-10 E10.9, E11.9). 1 each 0  . cholecalciferol (VITAMIN D3) 25 MCG (1000 UNIT) tablet Take 1,000 Units by mouth daily.    . fenofibrate 160 MG tablet TAKE ONE (1) TABLET EACH DAY 90 tablet 2  . fluticasone (FLONASE) 50 MCG/ACT nasal spray Place 1 spray into both nostrils 2 (two) times daily as needed for allergies or rhinitis. 16 g 6  . Omega-3 Fatty Acids (FISH OIL) 1000 MG CAPS Take 1,000 mg by mouth.    . testosterone cypionate (DEPOTESTOSTERONE CYPIONATE) 200 MG/ML injection INJECT 0.75ML IM EVERY 7 DAYS 10 mL 1  . therapeutic  multivitamin-minerals (THERAGRAN-M) tablet Take by mouth.     . Turmeric 500 MG TABS Take by mouth.    . vitamin B-12 (CYANOCOBALAMIN) 500 MCG tablet Take 500 mcg by mouth daily.     No current facility-administered medications on file prior to visit.    ROS Review of Systems  Constitutional: Negative for fever.  Respiratory: Negative for shortness of breath.   Cardiovascular: Negative for chest pain.  Musculoskeletal: Negative for arthralgias.  Skin: Negative for rash.    Objective:  BP (!) 159/94   Pulse 80   Temp (!) 96.7 F (35.9 C) (Temporal)   Ht _0  (1.854 m)   Wt 284 lb (128.8 kg)   BMI 37.47 kg/m   BP Readings from Last 3 Encounters:  10/05/20 (!) 159/94  09/17/20 (!) 163/85  06/23/20 (!) 152/89    Wt Readings from Last 3 Encounters:  10/05/20 284 lb (128.8 kg)  09/17/20 280 lb (127 kg)  06/23/20 278 lb 2 oz (126.2 kg)     Physical Exam Vitals reviewed.  Constitutional:      Appearance: He is well-developed.  HENT:     Head: Normocephalic and atraumatic.     Right Ear: Tympanic membrane and external ear normal. No decreased hearing noted.     Left Ear: Tympanic membrane and external ear normal. No decreased hearing noted.     Mouth/Throat:     Pharynx: No oropharyngeal exudate or posterior oropharyngeal erythema.  Eyes:     Pupils: Pupils are equal,  round, and reactive to light.  Cardiovascular:     Rate and Rhythm: Normal rate and regular rhythm.     Heart sounds: No murmur heard.   Pulmonary:     Effort: No respiratory distress.     Breath sounds: Normal breath sounds.  Abdominal:     General: Bowel sounds are normal.     Palpations: Abdomen is soft. There is no mass.     Tenderness: There is no abdominal tenderness.  Musculoskeletal:     Cervical back: Normal range of motion and neck supple.       Assessment & Plan:   James [name] was seen today for follow-up.  Diagnoses and all orders for this visit:  Elevated liver enzymes -      CBC with Differential/Platelet -     CMP14+EGFR  Hypertension associated with diabetes (James [name]) -     CBC with Differential/Platelet -     CMP14+EGFR -     metoprolol succinate (TOPROL-XL) 100 MG 24 hr tablet; Take 1 tablet (100 mg total) by mouth daily. Take with or immediately following a meal.  Acquired hypothyroidism -     CBC with Differential/Platelet -     CMP14+EGFR -     Thyroid Panel With TSH -     levothyroxine (SYNTHROID) 50 MCG tablet; TAKE ONE (1) TABLET EACH DAY  Type 2 diabetes mellitus without complication, without long-term current use of insulin ([facility]) -     CBC with Differential/Platelet -     CMP14+EGFR -     Bayer DCA Hb A1c Waived  Hyperlipidemia associated with type 2 diabetes mellitus ([facility]) -     CBC with Differential/Platelet -     CMP14+EGFR -     Lipid panel -     atorvastatin (LIPITOR) 40 MG tablet; Take 1 tablet (40 mg total) by mouth daily.  Hypogonadism in male -     CBC with Differential/Platelet -     CMP14+EGFR -     Testosterone,Free and Total  HIV screening declined  Screening for HIV (human immunodeficiency virus) -     HIV Antibody (routine testing w rflx) -     HIV Antibody (routine testing w rflx)  Need for hepatitis C screening test -     Hepatitis C antibody  Left otitis media with effusion  Other orders -     valsartan-hydrochlorothiazide (DIOVAN HCT) 320-25 MG tablet; Take 1 tablet by mouth daily. For Blood Pressure -     metFORMIN (GLUCOPHAGE-XR) 500 MG 24 hr tablet; Take 1 tablet (500 mg total) by mouth daily with breakfast. -     amoxicillin-clavulanate (AUGMENTIN) 875-125 MG tablet; Take 1 tablet by mouth 2 (two) times daily. Take all of this medication      I have discontinued James [name]'s losartan-hydrochlorothiazide, metFORMIN, and azithromycin. I have also changed his metoprolol succinate. Additionally, I am having him start on valsartan-hydrochlorothiazide, metFORMIN, and amoxicillin-clavulanate. Lastly, I am  having him maintain his Turmeric, therapeutic multivitamin-minerals, blood glucose meter kit and supplies, Fish Oil, fenofibrate, vitamin C, cholecalciferol, vitamin B-12, fluticasone, testosterone cypionate, atorvastatin, and levothyroxine.  Meds ordered this encounter  Medications  . atorvastatin (LIPITOR) 40 MG tablet    Sig: Take 1 tablet (40 mg total) by mouth daily.    Dispense:  90 tablet    Refill:  1  . levothyroxine (SYNTHROID) 50 MCG tablet    Sig: TAKE ONE (1) TABLET EACH DAY    Dispense:  90 tablet    Refill:  1  . metoprolol succinate (TOPROL-XL) 100 MG 24 hr tablet    Sig: Take 1 tablet (100 mg total) by mouth daily. Take with or immediately following a meal.    Dispense:  90 tablet    Refill:  1  . valsartan-hydrochlorothiazide (DIOVAN HCT) 320-25 MG tablet    Sig: Take 1 tablet by mouth daily. For Blood Pressure    Dispense:  30 tablet    Refill:  2  . metFORMIN (GLUCOPHAGE-XR) 500 MG 24 hr tablet    Sig: Take 1 tablet (500 mg total) by mouth daily with breakfast.    Dispense:  30 tablet    Refill:  2  . amoxicillin-clavulanate (AUGMENTIN) 875-125 MG tablet    Sig: Take 1 tablet by mouth 2 (two) times daily. Take all of this medication    Dispense:  20 tablet    Refill:  0     Follow-up: Return in about 3 months (around 01/05/2021).  Claretta Fraise, M.D.

## 2020-10-06 ENCOUNTER — Other Ambulatory Visit: Payer: 59

## 2020-10-07 ENCOUNTER — Encounter: Payer: Self-pay | Admitting: Family Medicine

## 2020-10-07 ENCOUNTER — Other Ambulatory Visit: Payer: Self-pay | Admitting: Family Medicine

## 2020-10-07 DIAGNOSIS — E291 Testicular hypofunction: Secondary | ICD-10-CM

## 2020-10-07 MED ORDER — TESTOSTERONE CYPIONATE 200 MG/ML IM SOLN: INTRAMUSCULAR | 1 refills | Status: DC

## 2020-10-08 LAB — CMP14+EGFR
ALT: 73 IU/L — ABNORMAL HIGH (ref 0–44)
AST: 42 IU/L — ABNORMAL HIGH (ref 0–40)
Albumin/Globulin Ratio: 1.7 (ref 1.2–2.2)
Albumin: 4.4 g/dL (ref 3.8–4.9)
Alkaline Phosphatase: 56 IU/L (ref 44–121)
BUN/Creatinine Ratio: 10 (ref 9–20)
BUN: 14 mg/dL (ref 6–24)
Bilirubin Total: 0.8 mg/dL (ref 0.0–1.2)
CO2: 23 mmol/L (ref 20–29)
Calcium: 10 mg/dL (ref 8.7–10.2)
Chloride: 96 mmol/L (ref 96–106)
Creatinine, Ser: 1.4 mg/dL — ABNORMAL HIGH (ref 0.76–1.27)
GFR calc Af Amer: 66 mL/min/{1.73_m2} (ref >59)
GFR calc non Af Amer: 57 mL/min/{1.73_m2} — ABNORMAL LOW (ref >59)
Globulin, Total: 2.6 g/dL (ref 1.5–4.5)
Glucose: 101 mg/dL — ABNORMAL HIGH (ref 65–99)
Potassium: 4.3 mmol/L (ref 3.5–5.2)
Sodium: 139 mmol/L (ref 134–144)
Total Protein: 7 g/dL (ref 6.0–8.5)

## 2020-10-08 LAB — THYROID PANEL WITH TSH
Free Thyroxine Index: 1.4 (ref 1.2–4.9)
T3 Uptake Ratio: 25 % (ref 24–39)
T4, Total: 5.5 ug/dL (ref 4.5–12.0)
TSH: 4.56 u[IU]/mL — ABNORMAL HIGH (ref 0.450–4.500)

## 2020-10-08 LAB — CBC WITH DIFFERENTIAL/PLATELET
Basophils Absolute: 0.1 10*3/uL (ref 0.0–0.2)
Basos: 1 %
EOS (ABSOLUTE): 0.3 10*3/uL (ref 0.0–0.4)
Eos: 2 %
Hematocrit: 56.5 % — ABNORMAL HIGH (ref 37.5–51.0)
Hemoglobin: 18.7 g/dL — ABNORMAL HIGH (ref 13.0–17.7)
Immature Grans (Abs): 0 10*3/uL (ref 0.0–0.1)
Immature Granulocytes: 0 %
Lymphocytes Absolute: 2.2 10*3/uL (ref 0.7–3.1)
Lymphs: 19 %
MCH: 30.4 pg (ref 26.6–33.0)
MCHC: 33.1 g/dL (ref 31.5–35.7)
MCV: 92 fL (ref 79–97)
Monocytes Absolute: 0.9 10*3/uL (ref 0.1–0.9)
Monocytes: 8 %
Neutrophils Absolute: 7.9 10*3/uL — ABNORMAL HIGH (ref 1.4–7.0)
Neutrophils: 70 %
Platelets: 240 10*3/uL (ref 150–450)
RBC: 6.16 x10E6/uL — ABNORMAL HIGH (ref 4.14–5.80)
RDW: 13.6 % (ref 11.6–15.4)
WBC: 11.4 10*3/uL — ABNORMAL HIGH (ref 3.4–10.8)

## 2020-10-08 LAB — LIPID PANEL
Chol/HDL Ratio: 4.9 ratio (ref 0.0–5.0)
Cholesterol, Total: 89 mg/dL — ABNORMAL LOW (ref 100–199)
HDL: 18 mg/dL — ABNORMAL LOW (ref >39)
LDL Chol Calc (NIH): 32 mg/dL (ref 0–99)
Triglycerides: 250 mg/dL — ABNORMAL HIGH (ref 0–149)
VLDL Cholesterol Cal: 39 mg/dL (ref 5–40)

## 2020-10-08 LAB — TESTOSTERONE,FREE AND TOTAL
Testosterone, Free: 33.4 pg/mL — ABNORMAL HIGH (ref 7.2–24.0)
Testosterone: 1147 ng/dL — ABNORMAL HIGH (ref 264–916)

## 2020-10-08 LAB — HEPATITIS C ANTIBODY: Hep C Virus Ab: <0.1 s/co ratio (ref 0.0–0.9)

## 2020-10-08 LAB — HIV ANTIBODY (ROUTINE TESTING W REFLEX): HIV Screen 4th Generation wRfx: NONREACTIVE

## 2020-12-02 ENCOUNTER — Other Ambulatory Visit: Payer: Self-pay | Admitting: Family Medicine

## 2020-12-09 ENCOUNTER — Other Ambulatory Visit: Payer: Self-pay

## 2020-12-09 DIAGNOSIS — Z20822 Contact with and (suspected) exposure to covid-19: Secondary | ICD-10-CM

## 2020-12-11 LAB — SARS-COV-2, NAA 2 DAY TAT

## 2020-12-11 LAB — NOVEL CORONAVIRUS, NAA: SARS-CoV-2, NAA: DETECTED — AB

## 2020-12-12 ENCOUNTER — Telehealth: Payer: Self-pay | Admitting: Nurse Practitioner

## 2020-12-12 NOTE — Telephone Encounter (Signed)
Called to Discuss with patient about Covid symptoms and the use of the monoclonal antibody infusion for those with mild to moderate Covid symptoms and at a high risk of hospitalization.     Pt appears to qualify for this infusion due to co-morbid conditions and/or a member of an at-risk group in accordance with the FDA Emergency Use Authorization.    Unable to reach pt    

## 2020-12-13 ENCOUNTER — Other Ambulatory Visit: Payer: Self-pay | Admitting: Unknown Physician Specialty

## 2020-12-13 ENCOUNTER — Encounter: Payer: Self-pay | Admitting: Family Medicine

## 2020-12-13 ENCOUNTER — Telehealth (INDEPENDENT_AMBULATORY_CARE_PROVIDER_SITE_OTHER): Payer: 59 | Admitting: Family Medicine

## 2020-12-13 ENCOUNTER — Telehealth: Payer: Self-pay | Admitting: Unknown Physician Specialty

## 2020-12-13 ENCOUNTER — Ambulatory Visit (HOSPITAL_COMMUNITY)
Admission: RE | Admit: 2020-12-13 | Discharge: 2020-12-13 | Disposition: A | Discharge status: Home / Self Care | Payer: 59 | Source: Ambulatory Visit | Attending: Pulmonary Disease | Admitting: Pulmonary Disease

## 2020-12-13 DIAGNOSIS — E1159 Type 2 diabetes mellitus with other circulatory complications: Secondary | ICD-10-CM | POA: Insufficient documentation

## 2020-12-13 DIAGNOSIS — I152 Hypertension secondary to endocrine disorders: Secondary | ICD-10-CM | POA: Insufficient documentation

## 2020-12-13 DIAGNOSIS — E119 Type 2 diabetes mellitus without complications: Secondary | ICD-10-CM

## 2020-12-13 DIAGNOSIS — U071 COVID-19: Secondary | ICD-10-CM

## 2020-12-13 MED ORDER — SODIUM CHLORIDE 0.9 % IV SOLN: Freq: Once | INTRAVENOUS | Status: AC

## 2020-12-13 MED ORDER — ONDANSETRON HCL 4 MG/2ML IJ SOLN
4.0000 mg | Freq: Once | INTRAMUSCULAR | Status: AC
  Administered 2020-12-13: 4 mg via INTRAVENOUS
  Filled 2020-12-13: qty 2

## 2020-12-13 MED ORDER — SODIUM CHLORIDE 0.9 % IV SOLN: INTRAVENOUS | Status: DC | PRN

## 2020-12-13 MED ORDER — SODIUM CHLORIDE 0.9 % IV BOLUS
1000.0000 mL | Freq: Once | INTRAVENOUS | Status: AC
  Administered 2020-12-13: 1000 mL via INTRAVENOUS

## 2020-12-13 MED ORDER — EPINEPHRINE 0.3 MG/0.3ML IJ SOAJ: 0.3000 mg | Freq: Once | INTRAMUSCULAR | Status: DC | PRN

## 2020-12-13 MED ORDER — FAMOTIDINE IN NACL 20-0.9 MG/50ML-% IV SOLN: 20.0000 mg | Freq: Once | INTRAVENOUS | Status: DC | PRN

## 2020-12-13 MED ORDER — DIPHENHYDRAMINE HCL 50 MG/ML IJ SOLN: 50.0000 mg | Freq: Once | INTRAMUSCULAR | Status: DC | PRN

## 2020-12-13 MED ORDER — ALBUTEROL SULFATE HFA 108 (90 BASE) MCG/ACT IN AERS: 2.0000 | INHALATION_SPRAY | Freq: Once | RESPIRATORY_TRACT | Status: DC | PRN

## 2020-12-13 MED ORDER — METHYLPREDNISOLONE SODIUM SUCC 125 MG IJ SOLR: 125.0000 mg | Freq: Once | INTRAMUSCULAR | Status: DC | PRN

## 2020-12-13 NOTE — Telephone Encounter (Signed)
I connected by phone with James [name] on 12/13/2020 at 9:07 AM to discuss the potential use of a new treatment for mild to moderate COVID-19 viral infection in non-hospitalized patients.  This patient is a 54 y.o. male that meets the FDA criteria for Emergency Use Authorization of COVID monoclonal antibody casirivimab/imdevimab, bamlanivimab/etesevimab, or sotrovimab.  Has a (+) direct SARS-CoV-2 viral test result  Has mild or moderate COVID-19   Is NOT hospitalized due to COVID-19  Is within 10 days of symptom onset  Has at least one of the high risk factor(s) for progression to severe COVID-19 and/or hospitalization as defined in EUA.  Specific high risk criteria : BMI > 25, Diabetes and Cardiovascular disease or hypertension   I have spoken and communicated the following to the patient or parent/caregiver regarding COVID monoclonal antibody treatment:  1. FDA has authorized the emergency use for the treatment of mild to moderate COVID-19 in adults and pediatric patients with positive results of direct SARS-CoV-2 viral testing who are 42 years of age and older weighing at least 40 kg, and who are at high risk for progressing to severe COVID-19 and/or hospitalization.  2. The significant known and potential risks and benefits of COVID monoclonal antibody, and the extent to which such potential risks and benefits are unknown.  3. Information on available alternative treatments and the risks and benefits of those alternatives, including clinical trials.  4. Patients treated with COVID monoclonal antibody should continue to self-isolate and use infection control measures (e.g., wear mask, isolate, social distance, avoid sharing personal items, clean and disinfect "high touch" surfaces, and frequent handwashing) according to CDC guidelines.   5. The patient or parent/caregiver has the option to accept or refuse COVID monoclonal antibody treatment.  After reviewing this information with  the patient, the patient has agreed to receive one of the available covid 19 monoclonal antibodies and will be provided an appropriate fact sheet prior to infusion. Gabriel Cirri, NP 12/13/2020 9:07 AM  Sx onset 12/22

## 2020-12-13 NOTE — Discharge Instructions (Signed)
10 Things You Can Do to Manage Your COVID-19 Symptoms at Home If you have possible or confirmed COVID-19: 1. Stay home from work and school. And stay away from other public places. If you must go out, avoid using any kind of public transportation, ridesharing, or taxis. 2. Monitor your symptoms carefully. If your symptoms get worse, call your healthcare provider immediately. 3. Get rest and stay hydrated. 4. If you have a medical appointment, call the healthcare provider ahead of time and tell them that you have or may have COVID-19. 5. For medical emergencies, call 911 and notify the dispatch personnel that you have or may have COVID-19. 6. Cover your cough and sneezes with a tissue or use the inside of your elbow. 7. Wash your hands often with soap and water for at least 20 seconds or clean your hands with an alcohol-based hand sanitizer that contains at least 60% alcohol. 8. As much as possible, stay in a specific room and away from other people in your home. Also, you should use a separate bathroom, if available. If you need to be around other people in or outside of the home, wear a mask. 9. Avoid sharing personal items with other people in your household, like dishes, towels, and bedding. 10. Clean all surfaces that are touched often, like counters, tabletops, and doorknobs. Use household cleaning sprays or wipes according to the label instructions. cdc.gov/coronavirus 06/18/2019 This information is not intended to replace advice given to you by your health care provider. Make sure you discuss any questions you have with your health care provider. Document Revised: 11/20/2019 Document Reviewed: 11/20/2019 Elsevier Patient Education  2020 Elsevier Inc. What types of side effects do monoclonal antibody drugs cause?  Common side effects  In general, the more common side effects caused by monoclonal antibody drugs include: . Allergic reactions, such as hives or itching . Flu-like signs and  symptoms, including chills, fatigue, fever, and muscle aches and pains . Nausea, vomiting . Diarrhea . Skin rashes . Low blood pressure   The CDC is recommending patients who receive monoclonal antibody treatments wait at least 90 days before being vaccinated.  Currently, there are no data on the safety and efficacy of mRNA COVID-19 vaccines in persons who received monoclonal antibodies or convalescent plasma as part of COVID-19 treatment. Based on the estimated half-life of such therapies as well as evidence suggesting that reinfection is uncommon in the 90 days after initial infection, vaccination should be deferred for at least 90 days, as a precautionary measure until additional information becomes available, to avoid interference of the antibody treatment with vaccine-induced immune responses. If you have any questions or concerns after the infusion please call the Advanced Practice Provider on call at [phone]. This number is ONLY intended for your use regarding questions or concerns about the infusion post-treatment side-effects.  Please do not provide this number to others for use. For return to work notes please contact your primary care provider.   If someone you know is interested in receiving treatment please have them call the COVID hotline at [phone].   

## 2020-12-13 NOTE — Progress Notes (Signed)
Patient reviewed Fact Sheet for Patients, Parents, and Caregivers for Emergency Use Authorization (EUA) of casirivimab/imdevimab for the Treatment of Coronavirus.  Patient also reviewed and is agreeable to the estimated cost of treatment.  Patient is agreeable to proceed.  

## 2020-12-13 NOTE — Progress Notes (Addendum)
    Subjective:    Patient ID: Ivory Broad, male    DOB: 1966-01-06, 54 y.o.   MRN: [id number]   HPI: CARLIN ATTRIDGE is a 54 y.o. male presenting for CoVID infection. Had monoclonal antibody infusion this morning. Had a bag of fluids due to dehydration. Had myalgia, sweats, fever, diarrhea,  cough and congestion. Weak and no energy. Has multiple risk factors.    Depression screen [facility] 2/9 10/05/2020 06/23/2020 03/25/2020 01/28/2020 01/19/2020  Decreased Interest 0 0 0 0 0  Down, Depressed, Hopeless 0 0 0 0 0  PHQ - 2 Score 0 0 0 0 0  Altered sleeping - - - - -  Tired, decreased energy - - - - -  Change in appetite - - - - -  Feeling bad or failure about yourself  - - - - -  Trouble concentrating - - - - -  Moving slowly or fidgety/restless - - - - -  Suicidal thoughts - - - - -  PHQ-9 Score - - - - -     Relevant past medical, surgical, family and social history reviewed and updated as indicated.  Interim medical history since our last visit reviewed. Allergies and medications reviewed and updated.  ROS:  Review of Systems   Social History   Tobacco Use  Smoking Status Never Smoker  Smokeless Tobacco Never Used       Objective:     Wt Readings from Last 3 Encounters:  10/05/20 284 lb (128.8 kg)  09/17/20 280 lb (127 kg)  06/23/20 278 lb 2 oz (126.2 kg)     Exam deferred. Pt. Harboring due to COVID 19. Phone visit performed.   Assessment & Plan:   1. COVID     Since he had MA this AM, no other treatment indicated at this time. Monitor condition at home. Treat symptomatically. Report dyspnea, etc.   Diagnoses and all orders for this visit:  COVID    Virtual Visit via telephone Note  I discussed the limitations, risks, security and privacy concerns of performing an evaluation and management service by telephone and the availability of in person appointments. The patient was identified with two identifiers. Pt.expressed understanding and agreed to proceed. Pt.  Is at home. Dr. Livia Snellen is in his office.  Follow Up Instructions:   I discussed the assessment and treatment plan with the patient. The patient was provided an opportunity to ask questions and all were answered. The patient agreed with the plan and demonstrated an understanding of the instructions.   The patient was advised to call back or seek an in-person evaluation if the symptoms worsen or if the condition fails to improve as anticipated.   Total minutes including chart review and phone contact time: 14   Follow up plan: Return if symptoms worsen or fail to improve.  Claretta Fraise, MD [place]

## 2020-12-15 ENCOUNTER — Encounter: Payer: Self-pay | Admitting: Family Medicine

## 2020-12-30 ENCOUNTER — Other Ambulatory Visit: Payer: Self-pay

## 2020-12-30 ENCOUNTER — Other Ambulatory Visit: Payer: Self-pay | Admitting: Family Medicine

## 2020-12-30 ENCOUNTER — Ambulatory Visit (INDEPENDENT_AMBULATORY_CARE_PROVIDER_SITE_OTHER): Payer: 59 | Admitting: Family Medicine

## 2020-12-30 ENCOUNTER — Encounter: Payer: Self-pay | Admitting: Family Medicine

## 2020-12-30 DIAGNOSIS — E785 Hyperlipidemia, unspecified: Secondary | ICD-10-CM

## 2020-12-30 DIAGNOSIS — J0110 Acute frontal sinusitis, unspecified: Secondary | ICD-10-CM | POA: Diagnosis not present

## 2020-12-30 DIAGNOSIS — E291 Testicular hypofunction: Secondary | ICD-10-CM | POA: Diagnosis not present

## 2020-12-30 DIAGNOSIS — I152 Hypertension secondary to endocrine disorders: Secondary | ICD-10-CM

## 2020-12-30 DIAGNOSIS — E039 Hypothyroidism, unspecified: Secondary | ICD-10-CM

## 2020-12-30 DIAGNOSIS — E1159 Type 2 diabetes mellitus with other circulatory complications: Secondary | ICD-10-CM

## 2020-12-30 DIAGNOSIS — E119 Type 2 diabetes mellitus without complications: Secondary | ICD-10-CM

## 2020-12-30 DIAGNOSIS — E1169 Type 2 diabetes mellitus with other specified complication: Secondary | ICD-10-CM

## 2020-12-30 LAB — BAYER DCA HB A1C WAIVED: HB A1C (BAYER DCA - WAIVED): 7.2 % — ABNORMAL HIGH (ref <7.0)

## 2020-12-30 MED ORDER — AMOXICILLIN-POT CLAVULANATE 875-125 MG PO TABS: 1.0000 | ORAL_TABLET | Freq: Two times a day (BID) | ORAL | 0 refills | Status: DC

## 2020-12-30 NOTE — Progress Notes (Signed)
Virtual Visit via telephone Note  I connected with James [name] on 12/30/20 at 1741 by telephone and verified that I am speaking with the correct person using two identifiers. James [name] is currently located at home and patient are currently with her during visit. The provider, Fransisca Kaufmann Dettinger, MD is located in their office at time of visit.  Call ended at 1748  I discussed the limitations, risks, security and privacy concerns of performing an evaluation and management service by telephone and the availability of in person appointments. I also discussed with the patient that there may be a patient responsible charge related to this service. The patient expressed understanding and agreed to proceed.   History and Present Illness: Patient has occasional ear infections and tubes.  This started about 1 week ago and he was sick with covid prior to that.  He got the infusion for covid. He denies any fevers or chills or wheezing or SOB. He has a lot of sinus pressure and ear pressure.     Outpatient Encounter Medications as of 12/30/2020  Medication Sig  . amoxicillin-clavulanate (AUGMENTIN) 875-125 MG tablet Take 1 tablet by mouth 2 (two) times daily. Take all of this medication  . Ascorbic Acid (VITAMIN C) 1000 MG tablet Take 1,000 mg by mouth daily.  Marland Kitchen atorvastatin (LIPITOR) 40 MG tablet TAKE ONE (1) TABLET EACH DAY  . blood glucose meter kit and supplies Dispense based on patient and insurance preference. Use up to four times daily as directed. (FOR ICD-10 E10.9, E11.9).  . cholecalciferol (VITAMIN D3) 25 MCG (1000 UNIT) tablet Take 1,000 Units by mouth daily.  . fenofibrate 160 MG tablet TAKE ONE (1) TABLET EACH DAY  . fluticasone (FLONASE) 50 MCG/ACT nasal spray Place 1 spray into both nostrils 2 (two) times daily as needed for allergies or rhinitis.  Marland Kitchen levothyroxine (SYNTHROID) 50 MCG tablet TAKE ONE (1) TABLET EACH DAY  . metFORMIN (GLUCOPHAGE-XR) 500 MG 24 hr tablet TAKE 1  TABLET DAILY WITH BREAKFAST  . metoprolol succinate (TOPROL-XL) 100 MG 24 hr tablet Take 1 tablet (100 mg total) by mouth daily. Take with or immediately following a meal.  . Omega-3 Fatty Acids (FISH OIL) 1000 MG CAPS Take 1,000 mg by mouth.  . testosterone cypionate (DEPOTESTOSTERONE CYPIONATE) 200 MG/ML injection INJECT 0.5ML IM EVERY 7 DAYS  . therapeutic multivitamin-minerals (THERAGRAN-M) tablet Take by mouth.   . Turmeric 500 MG TABS Take by mouth.  . valsartan-hydrochlorothiazide (DIOVAN-HCT) 320-25 MG tablet TAKE ONE TABLET BY MOUTH EACH DAY FOR BLOOD PRESSURE  . vitamin B-12 (CYANOCOBALAMIN) 500 MCG tablet Take 500 mcg by mouth daily.   No facility-administered encounter medications on file as of 12/30/2020.    Review of Systems  Constitutional: Negative for chills and fever.  HENT: Positive for congestion, ear pain, postnasal drip, rhinorrhea, sinus pressure and sore throat. Negative for ear discharge, sneezing and voice change.   Eyes: Negative for pain, discharge, redness and visual disturbance.  Respiratory: Negative for cough, shortness of breath and wheezing.   Cardiovascular: Negative for chest pain and leg swelling.  Musculoskeletal: Negative for gait problem.  Skin: Negative for rash.  All other systems reviewed and are negative.   Observations/Objective: Patient sounds comfortable and in no acute distress  Assessment and Plan: Sinus infection Augmentin and flonase   Follow up plan: Return if symptoms worsen or fail to improve.     I discussed the assessment and treatment plan with the patient. The patient was provided  an opportunity to ask questions and all were answered. The patient agreed with the plan and demonstrated an understanding of the instructions.   The patient was advised to call back or seek an in-person evaluation if the symptoms worsen or if the condition fails to improve as anticipated.  The above assessment and management plan was discussed  with the patient. The patient verbalized understanding of and has agreed to the management plan. Patient is aware to call the clinic if symptoms persist or worsen. Patient is aware when to return to the clinic for a follow-up visit. Patient educated on when it is appropriate to go to the emergency department.    I provided 7 minutes of non-face-to-face time during this encounter.    Worthy Rancher, MD

## 2021-01-03 LAB — CBC WITH DIFFERENTIAL/PLATELET
Basophils Absolute: 0.1 10*3/uL (ref 0.0–0.2)
Basos: 1 %
EOS (ABSOLUTE): 0.3 10*3/uL (ref 0.0–0.4)
Eos: 3 %
Hematocrit: 53 % — ABNORMAL HIGH (ref 37.5–51.0)
Hemoglobin: 18 g/dL — ABNORMAL HIGH (ref 13.0–17.7)
Immature Grans (Abs): 0.1 10*3/uL (ref 0.0–0.1)
Immature Granulocytes: 1 %
Lymphocytes Absolute: 2.5 10*3/uL (ref 0.7–3.1)
Lymphs: 22 %
MCH: 30.7 pg (ref 26.6–33.0)
MCHC: 34 g/dL (ref 31.5–35.7)
MCV: 90 fL (ref 79–97)
Monocytes Absolute: 1 10*3/uL — ABNORMAL HIGH (ref 0.1–0.9)
Monocytes: 9 %
Neutrophils Absolute: 7.1 10*3/uL — ABNORMAL HIGH (ref 1.4–7.0)
Neutrophils: 64 %
Platelets: 271 10*3/uL (ref 150–450)
RBC: 5.86 x10E6/uL — ABNORMAL HIGH (ref 4.14–5.80)
RDW: 13.1 % (ref 11.6–15.4)
WBC: 11 10*3/uL — ABNORMAL HIGH (ref 3.4–10.8)

## 2021-01-03 LAB — CMP14+EGFR
ALT: 42 IU/L (ref 0–44)
AST: 28 IU/L (ref 0–40)
Albumin/Globulin Ratio: 1.4 (ref 1.2–2.2)
Albumin: 4.2 g/dL (ref 3.8–4.9)
Alkaline Phosphatase: 64 IU/L (ref 44–121)
BUN/Creatinine Ratio: 10 (ref 9–20)
BUN: 14 mg/dL (ref 6–24)
Bilirubin Total: 0.5 mg/dL (ref 0.0–1.2)
CO2: 22 mmol/L (ref 20–29)
Calcium: 10.4 mg/dL — ABNORMAL HIGH (ref 8.7–10.2)
Chloride: 97 mmol/L (ref 96–106)
Creatinine, Ser: 1.38 mg/dL — ABNORMAL HIGH (ref 0.76–1.27)
GFR calc Af Amer: 67 mL/min/{1.73_m2} (ref >59)
GFR calc non Af Amer: 58 mL/min/{1.73_m2} — ABNORMAL LOW (ref >59)
Globulin, Total: 3 g/dL (ref 1.5–4.5)
Glucose: 166 mg/dL — ABNORMAL HIGH (ref 65–99)
Potassium: 4.4 mmol/L (ref 3.5–5.2)
Sodium: 138 mmol/L (ref 134–144)
Total Protein: 7.2 g/dL (ref 6.0–8.5)

## 2021-01-03 LAB — THYROID PANEL WITH TSH
Free Thyroxine Index: 1.5 (ref 1.2–4.9)
T3 Uptake Ratio: 28 % (ref 24–39)
T4, Total: 5.5 ug/dL (ref 4.5–12.0)
TSH: 2.52 u[IU]/mL (ref 0.450–4.500)

## 2021-01-03 LAB — TESTOSTERONE,FREE AND TOTAL: Testosterone: 1433 ng/dL — ABNORMAL HIGH (ref 264–916)

## 2021-01-03 LAB — LIPID PANEL
Chol/HDL Ratio: 9.6 ratio — ABNORMAL HIGH (ref 0.0–5.0)
Cholesterol, Total: 115 mg/dL (ref 100–199)
HDL: 12 mg/dL — ABNORMAL LOW (ref >39)
Triglycerides: 1038 mg/dL (ref 0–149)

## 2021-01-04 ENCOUNTER — Other Ambulatory Visit: Payer: Self-pay | Admitting: Family Medicine

## 2021-01-04 DIAGNOSIS — E1169 Type 2 diabetes mellitus with other specified complication: Secondary | ICD-10-CM

## 2021-01-04 DIAGNOSIS — E785 Hyperlipidemia, unspecified: Secondary | ICD-10-CM

## 2021-01-05 ENCOUNTER — Ambulatory Visit: Payer: 59 | Admitting: Family Medicine

## 2021-01-05 ENCOUNTER — Other Ambulatory Visit: Payer: Self-pay

## 2021-01-05 ENCOUNTER — Encounter: Payer: Self-pay | Admitting: Family Medicine

## 2021-01-05 VITALS — BP 130/89 | HR 93 | Temp 97.9°F | Resp 20 | Ht 73.0 in | Wt 283.4 lb

## 2021-01-05 DIAGNOSIS — H6982 Other specified disorders of Eustachian tube, left ear: Secondary | ICD-10-CM | POA: Diagnosis not present

## 2021-01-05 DIAGNOSIS — E785 Hyperlipidemia, unspecified: Secondary | ICD-10-CM

## 2021-01-05 DIAGNOSIS — E1169 Type 2 diabetes mellitus with other specified complication: Secondary | ICD-10-CM | POA: Diagnosis not present

## 2021-01-05 DIAGNOSIS — E291 Testicular hypofunction: Secondary | ICD-10-CM

## 2021-01-05 DIAGNOSIS — E1159 Type 2 diabetes mellitus with other circulatory complications: Secondary | ICD-10-CM | POA: Diagnosis not present

## 2021-01-05 DIAGNOSIS — I152 Hypertension secondary to endocrine disorders: Secondary | ICD-10-CM

## 2021-01-05 MED ORDER — AMOXICILLIN-POT CLAVULANATE 875-125 MG PO TABS: 1.0000 | ORAL_TABLET | Freq: Two times a day (BID) | ORAL | 0 refills | Status: DC

## 2021-01-05 MED ORDER — PREDNISONE 10 MG PO TABS: ORAL_TABLET | ORAL | 0 refills | Status: DC

## 2021-01-05 NOTE — Progress Notes (Signed)
Subjective:  Patient ID: James [name], male    DOB: Apr 14, 1966  Age: 55 y.o. MRN: [id number]  CC: Medical Management of Chronic Issues   HPI James [name] presents for Follow up for testosterone deficiency: Pt. Using medication as directed. Denies any sx referrable to DVT such as edema or erythema of legs. No dyspnea or chest pain. Energy level reported as being good. Libido is normal and denies E.D. Feels strength is adequate and improved from baseline.  He came in for blood test last week and his level was noted to be excessively high.  He did not cut the dose back as we had discussed previously.  He still taking the milliliters every week.  He agrees to cut that back based on last week's test results.  Additionally his triglycerides were quite high.  He has been taking both the Lipitor and the fenofibrate.  Hopefully once his testosterone starts coming down that will get better.  Additionally he may not have even been fasting and he does not recall as of when he had blood work done.   Follow-up of hypertension. Patient has no history of headache chest pain or shortness of breath or recent cough. Patient also denies symptoms of TIA such as numbness weakness lateralizing. Patient checks  blood pressure at home and has not had any elevated readings recently. Patient denies side effects from his medication. States taking it regularly.  Follow-up of diabetes.Patient denies symptoms such as polyuria, polydipsia, excessive hunger, nausea No significant hypoglycemic spells noted. Medications as noted below. Taking them regularly without complication/adverse reaction being reported today.    Depression screen [facility] 2/9 01/05/2021 10/05/2020 06/23/2020  Decreased Interest 0 0 0  Down, Depressed, Hopeless 0 0 0  PHQ - 2 Score 0 0 0  Altered sleeping - - -  Tired, decreased energy - - -  Change in appetite - - -  Feeling bad or failure about yourself  - - -  Trouble concentrating - - -  Moving  slowly or fidgety/restless - - -  Suicidal thoughts - - -  PHQ-9 Score - - -    History James [name] has a past medical history of Hyperlipidemia and Hypertension.   He has a past surgical history that includes Back surgery and Tonsillectomy.   His family history includes Alzheimer's disease in his father; COPD in his mother.He reports that he has never smoked. He has never used smokeless tobacco. He reports that he does not drink alcohol and does not use drugs.    ROS Review of Systems  Constitutional: Negative for fever.  HENT: Positive for congestion, ear pain (Left side only.  He is taking Augmentin for it.  This is been going on for a week without relief in spite of the medication), postnasal drip, sinus pressure and sinus pain.   Respiratory: Negative for shortness of breath.   Cardiovascular: Negative for chest pain.  Musculoskeletal: Negative for arthralgias (Right knee pain when he is resting if he turns the knee a certain way.  He is able to walk on it without difficulty however.).  Skin: Negative for rash.    Objective:  BP 130/89   Pulse 93   Temp 97.9 F (36.6 C) (Temporal)   Resp 20   Ht '6\' 1"'  (1.854 m)   Wt 283 lb 6 oz (128.5 kg)   SpO2 97%   BMI 37.39 kg/m   BP Readings from Last 3 Encounters:  01/05/21 130/89  12/13/20 139/79  10/05/20 Marland Kitchen)  159/94    Wt Readings from Last 3 Encounters:  01/05/21 283 lb 6 oz (128.5 kg)  10/05/20 284 lb (128.8 kg)  09/17/20 280 lb (127 kg)     Physical Exam Vitals reviewed.  Constitutional:      Appearance: He is well-developed and well-nourished.  HENT:     Head: Normocephalic and atraumatic.     Right Ear: Tympanic membrane, ear canal and external ear normal.     Left Ear: Ear canal and external ear normal.     Ears:     Comments: Left TM is hyperemic    Mouth/Throat:     Pharynx: No oropharyngeal exudate or posterior oropharyngeal erythema.  Eyes:     Pupils: Pupils are equal, round, and reactive to light.   Cardiovascular:     Rate and Rhythm: Normal rate and regular rhythm.     Heart sounds: No murmur heard.   Pulmonary:     Effort: No respiratory distress.     Breath sounds: Normal breath sounds.  Musculoskeletal:     Cervical back: Normal range of motion and neck supple.  Neurological:     Mental Status: He is alert and oriented to person, place, and time.    Results for orders placed or performed in visit on 12/30/20  Thyroid Panel With TSH  Result Value Ref Range   TSH 2.520 0.450 - 4.500 uIU/mL   T4, Total 5.5 4.5 - 12.0 ug/dL   T3 Uptake Ratio 28 24 - 39 %   Free Thyroxine Index 1.5 1.2 - 4.9  Testosterone,Free and Total  Result Value Ref Range   Testosterone 1,433 (H) 264 - 916 ng/dL   Testosterone, Free CANCELED pg/mL  Bayer DCA Hb A1c Waived  Result Value Ref Range   HB A1C (BAYER DCA - WAIVED) 7.2 (H) <7.0 %  Lipid panel  Result Value Ref Range   Cholesterol, Total 115 100 - 199 mg/dL   Triglycerides 1,038 (HH) 0 - 149 mg/dL   HDL 12 (L) >39 mg/dL   VLDL Cholesterol Cal Comment (A) 5 - 40 mg/dL   LDL Chol Calc (NIH) Comment (A) 0 - 99 mg/dL   Chol/HDL Ratio 9.6 (H) 0.0 - 5.0 ratio  CMP14+EGFR  Result Value Ref Range   Glucose 166 (H) 65 - 99 mg/dL   BUN 14 6 - 24 mg/dL   Creatinine, Ser 1.38 (H) 0.76 - 1.27 mg/dL   GFR calc non Af Amer 58 (L) >59 mL/min/1.73   GFR calc Af Amer 67 >59 mL/min/1.73   BUN/Creatinine Ratio 10 9 - 20   Sodium 138 134 - 144 mmol/L   Potassium 4.4 3.5 - 5.2 mmol/L   Chloride 97 96 - 106 mmol/L   CO2 22 20 - 29 mmol/L   Calcium 10.4 (H) 8.7 - 10.2 mg/dL   Total Protein 7.2 6.0 - 8.5 g/dL   Albumin 4.2 3.8 - 4.9 g/dL   Globulin, Total 3.0 1.5 - 4.5 g/dL   Albumin/Globulin Ratio 1.4 1.2 - 2.2   Bilirubin Total 0.5 0.0 - 1.2 mg/dL   Alkaline Phosphatase 64 44 - 121 IU/L   AST 28 0 - 40 IU/L   ALT 42 0 - 44 IU/L  CBC with Differential/Platelet  Result Value Ref Range   WBC 11.0 (H) 3.4 - 10.8 x10E3/uL   RBC 5.86 (H) 4.14 -  5.80 x10E6/uL   Hemoglobin 18.0 (H) 13.0 - 17.7 g/dL   Hematocrit 53.0 (H) 37.5 - 51.0 %  MCV 90 79 - 97 fL   MCH 30.7 26.6 - 33.0 pg   MCHC 34.0 31.5 - 35.7 g/dL   RDW 13.1 11.6 - 15.4 %   Platelets 271 150 - 450 x10E3/uL   Neutrophils 64 Not Estab. %   Lymphs 22 Not Estab. %   Monocytes 9 Not Estab. %   Eos 3 Not Estab. %   Basos 1 Not Estab. %   Neutrophils Absolute 7.1 (H) 1.4 - 7.0 x10E3/uL   Lymphocytes Absolute 2.5 0.7 - 3.1 x10E3/uL   Monocytes Absolute 1.0 (H) 0.1 - 0.9 x10E3/uL   EOS (ABSOLUTE) 0.3 0.0 - 0.4 x10E3/uL   Basophils Absolute 0.1 0.0 - 0.2 x10E3/uL   Immature Granulocytes 1 Not Estab. %   Immature Grans (Abs) 0.1 0.0 - 0.1 x10E3/uL      Assessment & Plan:   Casyn was seen today for medical management of chronic issues.  Diagnoses and all orders for this visit:  Hypertension associated with diabetes ([place])  Hyperlipidemia associated with type 2 diabetes mellitus ([place]) -     Lipid panel; Standing  ETD (Eustachian tube dysfunction), left  Hypogonadism in male -     Testosterone,Free and Total; Standing  Other orders -     amoxicillin-clavulanate (AUGMENTIN) 875-125 MG tablet; Take 1 tablet by mouth 2 (two) times daily. Take all of this medication -     predniSONE (DELTASONE) 10 MG tablet; Take 5 daily for 2 days followed by 4,3,2 and 1 for 2 days each.    He is continue medications as is with the exception of cutting back on the testosterone to 1/2 mL weekly.  Presented to peak and trough levels in 2 weeks after making the change to 1/2 mL every week IM.  For the ear I want to continue the Augmentin for a second course of 10 days for total of 20 days.  He is also been ordered a prednisone taper. I am having James [name] start on amoxicillin-clavulanate and predniSONE. I am also having him maintain his Turmeric, therapeutic multivitamin-minerals, blood glucose meter kit and supplies, Fish Oil, fenofibrate, vitamin C, cholecalciferol, vitamin B-12,  fluticasone, levothyroxine, metoprolol succinate, testosterone cypionate, valsartan-hydrochlorothiazide, metFORMIN, atorvastatin, amoxicillin-clavulanate, and zinc gluconate.  Allergies as of 01/05/2021   No Known Allergies     Medication List       Accurate as of January 05, 2021  5:05 PM. If you have any questions, ask your nurse or doctor.        amoxicillin-clavulanate 875-125 MG tablet Commonly known as: AUGMENTIN Take 1 tablet by mouth 2 (two) times daily. Take all of this medication What changed: Another medication with the same name was added. Make sure you understand how and when to take each. Changed by: Claretta Fraise, MD   amoxicillin-clavulanate [phone] MG tablet Commonly known as: AUGMENTIN Take 1 tablet by mouth 2 (two) times daily. Take all of this medication What changed: You were already taking a medication with the same name, and this prescription was added. Make sure you understand how and when to take each. Changed by: Claretta Fraise, MD   atorvastatin 40 MG tablet Commonly known as: LIPITOR TAKE ONE (1) TABLET EACH DAY   blood glucose meter kit and supplies Dispense based on patient and insurance preference. Use up to four times daily as directed. (FOR ICD-10 E10.9, E11.9).   cholecalciferol 25 MCG (1000 UNIT) tablet Commonly known as: VITAMIN D3 Take 1,000 Units by mouth daily.   fenofibrate 160 MG  tablet TAKE ONE (1) TABLET EACH DAY   Fish Oil 1000 MG Caps Take 1,000 mg by mouth.   fluticasone 50 MCG/ACT nasal spray Commonly known as: FLONASE Place 1 spray into both nostrils 2 (two) times daily as needed for allergies or rhinitis.   levothyroxine 50 MCG tablet Commonly known as: SYNTHROID TAKE ONE (1) TABLET EACH DAY   metFORMIN 500 MG 24 hr tablet Commonly known as: GLUCOPHAGE-XR TAKE 1 TABLET DAILY WITH BREAKFAST   metoprolol succinate 100 MG 24 hr tablet Commonly known as: TOPROL-XL Take 1 tablet (100 mg total) by mouth daily. Take with  or immediately following a meal.   predniSONE 10 MG tablet Commonly known as: DELTASONE Take 5 daily for 2 days followed by 4,3,2 and 1 for 2 days each. Started by: Claretta Fraise, MD   testosterone cypionate 200 MG/ML injection Commonly known as: DEPOTESTOSTERONE CYPIONATE INJECT 0.5ML IM EVERY 7 DAYS   therapeutic multivitamin-minerals tablet Take by mouth.   Turmeric 500 MG Tabs Take by mouth.   valsartan-hydrochlorothiazide 320-25 MG tablet Commonly known as: DIOVAN-HCT TAKE ONE TABLET BY MOUTH EACH DAY FOR BLOOD PRESSURE   vitamin B-12 500 MCG tablet Commonly known as: CYANOCOBALAMIN Take 500 mcg by mouth daily.   vitamin C 1000 MG tablet Take 1,000 mg by mouth daily.   zinc gluconate 50 MG tablet Take 50 mg by mouth daily.        Follow-up: Return in about 3 months (around 04/05/2021).  Claretta Fraise, M.D.

## 2021-01-18 ENCOUNTER — Other Ambulatory Visit: Payer: Self-pay | Admitting: Family Medicine

## 2021-01-18 DIAGNOSIS — E1159 Type 2 diabetes mellitus with other circulatory complications: Secondary | ICD-10-CM

## 2021-01-18 DIAGNOSIS — I152 Hypertension secondary to endocrine disorders: Secondary | ICD-10-CM

## 2021-01-27 DIAGNOSIS — Z029 Encounter for administrative examinations, unspecified: Secondary | ICD-10-CM

## 2021-02-03 ENCOUNTER — Other Ambulatory Visit: Payer: Self-pay | Admitting: Family Medicine

## 2021-02-04 ENCOUNTER — Other Ambulatory Visit: Payer: Self-pay

## 2021-02-04 ENCOUNTER — Other Ambulatory Visit: Payer: 59

## 2021-02-04 DIAGNOSIS — E1169 Type 2 diabetes mellitus with other specified complication: Secondary | ICD-10-CM

## 2021-02-04 DIAGNOSIS — E291 Testicular hypofunction: Secondary | ICD-10-CM

## 2021-02-04 DIAGNOSIS — E785 Hyperlipidemia, unspecified: Secondary | ICD-10-CM

## 2021-02-08 LAB — LIPID PANEL
Chol/HDL Ratio: 4.9 ratio (ref 0.0–5.0)
Cholesterol, Total: 78 mg/dL — ABNORMAL LOW (ref 100–199)
HDL: 16 mg/dL — ABNORMAL LOW (ref >39)
LDL Chol Calc (NIH): 14 mg/dL (ref 0–99)
Triglycerides: 344 mg/dL — ABNORMAL HIGH (ref 0–149)
VLDL Cholesterol Cal: 48 mg/dL — ABNORMAL HIGH (ref 5–40)

## 2021-02-08 LAB — TESTOSTERONE,FREE AND TOTAL
Testosterone, Free: 24.4 pg/mL — ABNORMAL HIGH (ref 7.2–24.0)
Testosterone: 869 ng/dL (ref 264–916)

## 2021-02-25 ENCOUNTER — Ambulatory Visit: Payer: 59 | Admitting: Family

## 2021-03-28 ENCOUNTER — Other Ambulatory Visit: Payer: Self-pay | Admitting: Family Medicine

## 2021-03-28 DIAGNOSIS — E785 Hyperlipidemia, unspecified: Secondary | ICD-10-CM

## 2021-03-28 DIAGNOSIS — E1169 Type 2 diabetes mellitus with other specified complication: Secondary | ICD-10-CM

## 2021-04-04 ENCOUNTER — Other Ambulatory Visit: Payer: Self-pay | Admitting: Family Medicine

## 2021-04-27 ENCOUNTER — Other Ambulatory Visit: Payer: Self-pay | Admitting: Family Medicine

## 2021-04-27 DIAGNOSIS — E291 Testicular hypofunction: Secondary | ICD-10-CM

## 2021-05-04 ENCOUNTER — Encounter: Payer: Self-pay | Admitting: Family Medicine

## 2021-05-04 ENCOUNTER — Ambulatory Visit (INDEPENDENT_AMBULATORY_CARE_PROVIDER_SITE_OTHER): Payer: 59 | Admitting: Family Medicine

## 2021-05-04 ENCOUNTER — Other Ambulatory Visit: Payer: Self-pay | Admitting: Family Medicine

## 2021-05-04 ENCOUNTER — Other Ambulatory Visit: Payer: Self-pay

## 2021-05-04 VITALS — BP 139/82 | HR 87 | Temp 98.2°F | Ht 73.0 in | Wt 280.2 lb

## 2021-05-04 DIAGNOSIS — E1169 Type 2 diabetes mellitus with other specified complication: Secondary | ICD-10-CM

## 2021-05-04 DIAGNOSIS — B351 Tinea unguium: Secondary | ICD-10-CM | POA: Diagnosis not present

## 2021-05-04 DIAGNOSIS — E119 Type 2 diabetes mellitus without complications: Secondary | ICD-10-CM

## 2021-05-04 DIAGNOSIS — E785 Hyperlipidemia, unspecified: Secondary | ICD-10-CM

## 2021-05-04 DIAGNOSIS — L84 Corns and callosities: Secondary | ICD-10-CM

## 2021-05-04 LAB — BAYER DCA HB A1C WAIVED: HB A1C (BAYER DCA - WAIVED): 8.4 % — ABNORMAL HIGH (ref <7.0)

## 2021-05-04 MED ORDER — DAPAGLIFLOZIN PROPANEDIOL 10 MG PO TABS: 10.0000 mg | ORAL_TABLET | Freq: Every day | ORAL | 3 refills | Status: DC

## 2021-05-04 NOTE — Patient Instructions (Signed)

## 2021-05-04 NOTE — Progress Notes (Signed)
Subjective:  Patient ID: James [name], male    DOB: 08-24-1966  Age: 55 y.o. MRN: [id number]  CC: Medical Management of Chronic Issues   HPI James [name] presents for A1c elevated to 8+ on work exam. Working on portion reduction. Eating at [employer] a lot. Increasing water. Decreasing soda intake. Drinking diet sodas. No time to prep meals. There is a nutritionist at work that will help him with diet.  James [name] even says that the nutritionist will go with him to the grocery store to help him find appropriate foods.  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  He has multiple other questions about food and exercise and an information report will be given to him on carb counting for diabetics at the end of the visit.   Stools loose, oily with belly pain occurring after eating he states this has been ongoing since he started taking metformin.  This is in spite of the fact that he is taking the extended release at the lowest possible dose.   Calluses at 1st toe bilaterally.  The shaves them off himself with a knife every few months.  He also has irregular toenails and some onychomycosis. Depression screen [facility] 2/9 05/04/2021 05/04/2021 01/05/2021  Decreased Interest 0 0 0  Down, Depressed, Hopeless 0 0 0  PHQ - 2 Score 0 0 0  Altered sleeping 2 - -  Tired, decreased energy 1 - -  Change in appetite 0 - -  Feeling bad or failure about yourself  0 - -  Trouble concentrating 0 - -  Moving slowly or fidgety/restless 0 - -  Suicidal thoughts 0 - -  PHQ-9 Score 3 - -  Difficult doing work/chores Not difficult at all - -    History James [name] has a past medical history of Hyperlipidemia and Hypertension.   He has a past surgical history that includes Back surgery and Tonsillectomy.   His family history includes Alzheimer's disease in his father; COPD in his mother.He reports that he has never smoked. He has never used smokeless tobacco. He reports  that he does not drink alcohol and does not use drugs.    ROS Review of Systems  Constitutional: Negative for fever.  Respiratory: Negative for shortness of breath.   Cardiovascular: Negative for chest pain.  Musculoskeletal: Negative for arthralgias.  Skin: Negative for rash.    Objective:  BP 139/82   Pulse 87   Temp 98.2 F (36.8 C)   Ht '6\' 1"'  (1.854 m)   Wt 280 lb 3.2 oz (127.1 kg)   SpO2 95%   BMI 36.97 kg/m   BP Readings from Last 3 Encounters:  05/04/21 139/82  01/05/21 130/89  12/13/20 139/79    Wt Readings from Last 3 Encounters:  05/04/21 280 lb 3.2 oz (127.1 kg)  01/05/21 283 lb 6 oz (128.5 kg)  10/05/20 284 lb (128.8 kg)     Physical Exam Vitals reviewed.  Constitutional:      Appearance: He is well-developed.  HENT:     Head: Normocephalic and atraumatic.     Right Ear: External ear normal.     Left Ear: External ear normal.     Mouth/Throat:     Pharynx: No oropharyngeal exudate or posterior oropharyngeal erythema.  Eyes:     Pupils: Pupils are equal, round, and reactive to light.  Cardiovascular:     Rate and Rhythm: Normal rate and regular  rhythm.     Heart sounds: No murmur heard.   Pulmonary:     Effort: No respiratory distress.     Breath sounds: Normal breath sounds.  Musculoskeletal:     Cervical back: Normal range of motion and neck supple.  Skin:    Findings: Lesion (there are calluses at the medial aspect of the great toe of each foot.  These measure 1 x 2-1/2 cm.  They are white and rubbery.  The one on the right foot has a central crusting of dried blood that he tells me is from him cutting the center of it out with) present.  Neurological:     Mental Status: He is alert and oriented to person, place, and time.     A1c = 8.4  Assessment & Plan:   James [name] was seen today for medical management of chronic issues.  Diagnoses and all orders for this visit:  Type 2 diabetes mellitus without complication, without long-term  current use of insulin (HCC) -     Bayer DCA Hb A1c Waived -     CMP14+EGFR -     CBC with Differential/Platelet  Hyperlipidemia associated with type 2 diabetes mellitus ([place]) -     Lipid panel  Foot callus -     Ambulatory referral to Podiatry  Onychomycosis  Other orders -     dapagliflozin propanediol (FARXIGA) 10 MG TABS tablet; Take 1 tablet (10 mg total) by mouth daily before breakfast.  Reaction to metformin, loose stool   I have discontinued James [name]'s amoxicillin-clavulanate, amoxicillin-clavulanate, predniSONE, and metFORMIN. I am also having him start on dapagliflozin propanediol. Additionally, I am having him maintain his Turmeric, therapeutic multivitamin-minerals, blood glucose meter kit and supplies, Fish Oil, fenofibrate, vitamin C, cholecalciferol, vitamin B-12, fluticasone, levothyroxine, zinc gluconate, metoprolol succinate, valsartan-hydrochlorothiazide, atorvastatin, and testosterone cypionate.  Allergies as of 05/04/2021      Reactions   Metformin And Related    Loose bloody stools      Medication List       Accurate as of May 04, 2021  1:55 PM. If you have any questions, ask your nurse or doctor.        STOP taking these medications   amoxicillin-clavulanate 875-125 MG tablet Commonly known as: AUGMENTIN Stopped by: Claretta Fraise, MD   metFORMIN 500 MG 24 hr tablet Commonly known as: GLUCOPHAGE-XR Stopped by: Claretta Fraise, MD   predniSONE 10 MG tablet Commonly known as: DELTASONE Stopped by: Claretta Fraise, MD     TAKE these medications   atorvastatin 40 MG tablet Commonly known as: LIPITOR Take 1 tablet (40 mg total) by mouth daily. (NEEDS TO BE SEEN BEFORE NEXT REFILL)   blood glucose meter kit and supplies Dispense based on patient and insurance preference. Use up to four times daily as directed. (FOR ICD-10 E10.9, E11.9).   cholecalciferol 25 MCG (1000 UNIT) tablet Commonly known as: VITAMIN D3 Take 1,000 Units by mouth  daily.   dapagliflozin propanediol 10 MG Tabs tablet Commonly known as: Farxiga Take 1 tablet (10 mg total) by mouth daily before breakfast. Started by: Claretta Fraise, MD   fenofibrate 160 MG tablet TAKE ONE (1) TABLET EACH DAY   Fish Oil 1000 MG Caps Take 1,000 mg by mouth.   fluticasone 50 MCG/ACT nasal spray Commonly known as: FLONASE Place 1 spray into both nostrils 2 (two) times daily as needed for allergies or rhinitis.   levothyroxine 50 MCG tablet Commonly known as: SYNTHROID TAKE ONE (1) TABLET  EACH DAY   metoprolol succinate 100 MG 24 hr tablet Commonly known as: TOPROL-XL TAKE 1 TABLET ONCE DAILY. TAKE WITH OR IMMEDIATELY FOLLOWING A MEAL   testosterone cypionate 200 MG/ML injection Commonly known as: DEPOTESTOSTERONE CYPIONATE INJECT 0.5ML IM EVERY 7 DAYS   therapeutic multivitamin-minerals tablet Take by mouth.   Turmeric 500 MG Tabs Take by mouth.   valsartan-hydrochlorothiazide 320-25 MG tablet Commonly known as: DIOVAN-HCT TAKE ONE TABLET BY MOUTH EACH DAY FOR BLOOD PRESSURE   vitamin B-12 500 MCG tablet Commonly known as: CYANOCOBALAMIN Take 500 mcg by mouth daily.   vitamin C 1000 MG tablet Take 1,000 mg by mouth daily.   zinc gluconate 50 MG tablet Take 50 mg by mouth daily.        Follow-up: Return in about 1 month (around 06/04/2021).  Claretta Fraise, M.D.

## 2021-05-05 LAB — CMP14+EGFR
ALT: 46 IU/L — ABNORMAL HIGH (ref 0–44)
AST: 30 IU/L (ref 0–40)
Albumin/Globulin Ratio: 1.3 (ref 1.2–2.2)
Albumin: 3.9 g/dL (ref 3.8–4.9)
Alkaline Phosphatase: 53 IU/L (ref 44–121)
BUN/Creatinine Ratio: 12 (ref 9–20)
BUN: 16 mg/dL (ref 6–24)
Bilirubin Total: 0.7 mg/dL (ref 0.0–1.2)
CO2: 22 mmol/L (ref 20–29)
Calcium: 9.9 mg/dL (ref 8.7–10.2)
Chloride: 96 mmol/L (ref 96–106)
Creatinine, Ser: 1.36 mg/dL — ABNORMAL HIGH (ref 0.76–1.27)
Globulin, Total: 3 g/dL (ref 1.5–4.5)
Glucose: 143 mg/dL — ABNORMAL HIGH (ref 65–99)
Potassium: 3.9 mmol/L (ref 3.5–5.2)
Sodium: 136 mmol/L (ref 134–144)
Total Protein: 6.9 g/dL (ref 6.0–8.5)
eGFR: 62 mL/min/{1.73_m2} (ref >59)

## 2021-05-05 LAB — LIPID PANEL
Chol/HDL Ratio: 6.8 ratio — ABNORMAL HIGH (ref 0.0–5.0)
Cholesterol, Total: 88 mg/dL — ABNORMAL LOW (ref 100–199)
HDL: 13 mg/dL — ABNORMAL LOW (ref >39)
LDL Chol Calc (NIH): 14 mg/dL (ref 0–99)
Triglycerides: 455 mg/dL — ABNORMAL HIGH (ref 0–149)
VLDL Cholesterol Cal: 61 mg/dL — ABNORMAL HIGH (ref 5–40)

## 2021-05-05 LAB — CBC WITH DIFFERENTIAL/PLATELET
Basophils Absolute: 0.1 10*3/uL (ref 0.0–0.2)
Basos: 1 %
EOS (ABSOLUTE): 0.3 10*3/uL (ref 0.0–0.4)
Eos: 3 %
Hematocrit: 53.6 % — ABNORMAL HIGH (ref 37.5–51.0)
Hemoglobin: 17.9 g/dL — ABNORMAL HIGH (ref 13.0–17.7)
Immature Grans (Abs): 0 10*3/uL (ref 0.0–0.1)
Immature Granulocytes: 0 %
Lymphocytes Absolute: 2.6 10*3/uL (ref 0.7–3.1)
Lymphs: 25 %
MCH: 29.9 pg (ref 26.6–33.0)
MCHC: 33.4 g/dL (ref 31.5–35.7)
MCV: 90 fL (ref 79–97)
Monocytes Absolute: 1 10*3/uL — ABNORMAL HIGH (ref 0.1–0.9)
Monocytes: 9 %
Neutrophils Absolute: 6.3 10*3/uL (ref 1.4–7.0)
Neutrophils: 62 %
Platelets: 220 10*3/uL (ref 150–450)
RBC: 5.98 x10E6/uL — ABNORMAL HIGH (ref 4.14–5.80)
RDW: 13.1 % (ref 11.6–15.4)
WBC: 10.3 10*3/uL (ref 3.4–10.8)

## 2021-05-09 ENCOUNTER — Telehealth: Payer: Self-pay | Admitting: Family Medicine

## 2021-05-17 ENCOUNTER — Other Ambulatory Visit: Payer: Self-pay | Admitting: Family Medicine

## 2021-05-17 DIAGNOSIS — E039 Hypothyroidism, unspecified: Secondary | ICD-10-CM

## 2021-05-30 ENCOUNTER — Telehealth: Payer: Self-pay | Admitting: Family Medicine

## 2021-05-30 NOTE — Telephone Encounter (Signed)
Pt called and ER follow up made

## 2021-05-31 ENCOUNTER — Encounter: Payer: Self-pay | Admitting: Family Medicine

## 2021-05-31 ENCOUNTER — Other Ambulatory Visit: Payer: Self-pay

## 2021-05-31 ENCOUNTER — Ambulatory Visit (INDEPENDENT_AMBULATORY_CARE_PROVIDER_SITE_OTHER): Payer: 59 | Admitting: Family Medicine

## 2021-05-31 VITALS — HR 80 | Temp 97.6°F | Ht 73.0 in | Wt 271.6 lb

## 2021-05-31 DIAGNOSIS — K5792 Diverticulitis of intestine, part unspecified, without perforation or abscess without bleeding: Secondary | ICD-10-CM | POA: Diagnosis not present

## 2021-05-31 DIAGNOSIS — R1084 Generalized abdominal pain: Secondary | ICD-10-CM | POA: Diagnosis not present

## 2021-05-31 LAB — URINALYSIS
Bilirubin, UA: NEGATIVE
Glucose, UA: NEGATIVE
Ketones, UA: NEGATIVE
Nitrite, UA: NEGATIVE
Protein,UA: NEGATIVE
RBC, UA: NEGATIVE
Specific Gravity, UA: >1.03 — ABNORMAL HIGH (ref 1.005–1.030)
Urobilinogen, Ur: 0.2 mg/dL (ref 0.2–1.0)
pH, UA: 5.5 (ref 5.0–7.5)

## 2021-05-31 NOTE — Progress Notes (Signed)
Subjective:  Patient ID: James [name], male    DOB: [birth year]  Age: 55 y.o. MRN: [id number]  CC: No chief complaint on file.   HPI James [name] presents for follow-up from his trip to the emergency room.  He was there last night and he had been there 3 days before.  He is having a lot of abdominal discomfort.  He was diagnosed with diverticulitis.  This was done via CT scan.  The CT scan was performed on 610 and repeated on 613.  Results are available through care everywhere and are reviewed they do confirm diverticulitis.  Apparently the placement was great placed on amoxicillin at his first encounter and changed to metronidazole and Cipro last night.  He says he is getting better as of this morning.  He also has a prescription for Lomotil because he has had some diarrhea along the way.  He is also using ibuprofen for pain relief currently he is not nauseous or vomiting.  He has questions about what he can and cannot eat.  Depression screen [facility] 2/9 05/31/2021 05/31/2021 05/04/2021  Decreased Interest 0 0 0  Down, Depressed, Hopeless 0 0 0  PHQ - 2 Score 0 0 0  Altered sleeping 1 - 2  Tired, decreased energy 1 - 1  Change in appetite 2 - 0  Feeling bad or failure about yourself  0 - 0  Trouble concentrating 0 - 0  Moving slowly or fidgety/restless 0 - 0  Suicidal thoughts - - 0  PHQ-9 Score 4 - 3  Difficult doing work/chores Not difficult at all - Not difficult at all    History James [name] has a past medical history of Hyperlipidemia and Hypertension.   He has a past surgical history that includes Back surgery and Tonsillectomy.   His family history includes Alzheimer's disease in his father; COPD in his mother.He reports that he has never smoked. He has never used smokeless tobacco. He reports that he does not drink alcohol and does not use drugs.    ROS Review of Systems  Constitutional:  Negative for fever.  Respiratory:  Negative for shortness of breath.   Cardiovascular:   Negative for chest pain.  Musculoskeletal:  Negative for arthralgias.  Skin:  Negative for rash.   Objective:  Pulse 80   Temp 97.6 F (36.4 C)   Ht '6\' 1"'  (1.854 m)   Wt 271 lb 9.6 oz (123.2 kg)   SpO2 96%   BMI 35.83 kg/m   BP Readings from Last 3 Encounters:  05/04/21 139/82  01/05/21 130/89  12/13/20 139/79    Wt Readings from Last 3 Encounters:  05/31/21 271 lb 9.6 oz (123.2 kg)  05/04/21 280 lb 3.2 oz (127.1 kg)  01/05/21 283 lb 6 oz (128.5 kg)     Physical Exam Vitals reviewed.  Constitutional:      Appearance: He is well-developed.  HENT:     Head: Normocephalic and atraumatic.     Right Ear: External ear normal.     Left Ear: External ear normal.     Mouth/Throat:     Pharynx: No oropharyngeal exudate or posterior oropharyngeal erythema.  Eyes:     Pupils: Pupils are equal, round, and reactive to light.  Cardiovascular:     Rate and Rhythm: Normal rate and regular rhythm.     Heart sounds: No murmur heard. Pulmonary:     Effort: No respiratory distress.     Breath sounds: Normal breath sounds.  Abdominal:  Palpations: Abdomen is soft. There is no mass.     Tenderness: There is abdominal tenderness (diffusely). There is no guarding or rebound.  Musculoskeletal:     Cervical back: Normal range of motion and neck supple.  Neurological:     Mental Status: He is alert and oriented to person, place, and time.      Assessment & Plan:   Diagnoses and all orders for this visit:  Diverticulitis -     Urinalysis  Generalized abdominal pain -     Urinalysis      I am having James [name] maintain his Turmeric, therapeutic multivitamin-minerals, blood glucose meter kit and supplies, Fish Oil, fenofibrate, vitamin C, cholecalciferol, vitamin B-12, fluticasone, zinc gluconate, metoprolol succinate, valsartan-hydrochlorothiazide, atorvastatin, testosterone cypionate, dapagliflozin propanediol, levothyroxine, metroNIDAZOLE, ciprofloxacin,  diphenoxylate-atropine, and promethazine.  Allergies as of 05/31/2021       Reactions   Metformin And Related    Loose bloody stools        Medication List        Accurate as of May 31, 2021  9:15 PM. If you have any questions, ask your nurse or doctor.          atorvastatin 40 MG tablet Commonly known as: LIPITOR Take 1 tablet (40 mg total) by mouth daily. (NEEDS TO BE SEEN BEFORE NEXT REFILL)   blood glucose meter kit and supplies Dispense based on patient and insurance preference. Use up to four times daily as directed. (FOR ICD-10 E10.9, E11.9).   cholecalciferol 25 MCG (1000 UNIT) tablet Commonly known as: VITAMIN D3 Take 1,000 Units by mouth daily.   ciprofloxacin 500 MG tablet Commonly known as: CIPRO Take 500 mg by mouth 2 (two) times daily.   dapagliflozin propanediol 10 MG Tabs tablet Commonly known as: Farxiga Take 1 tablet (10 mg total) by mouth daily before breakfast.   diphenoxylate-atropine 2.5-0.025 MG tablet Commonly known as: LOMOTIL Take by mouth 4 (four) times daily as needed for diarrhea or loose stools.   fenofibrate 160 MG tablet TAKE ONE (1) TABLET EACH DAY   Fish Oil 1000 MG Caps Take 1,000 mg by mouth.   fluticasone 50 MCG/ACT nasal spray Commonly known as: FLONASE Place 1 spray into both nostrils 2 (two) times daily as needed for allergies or rhinitis.   levothyroxine 50 MCG tablet Commonly known as: SYNTHROID TAKE ONE (1) TABLET EACH DAY   metoprolol succinate 100 MG 24 hr tablet Commonly known as: TOPROL-XL TAKE 1 TABLET ONCE DAILY. TAKE WITH OR IMMEDIATELY FOLLOWING A MEAL   metroNIDAZOLE 500 MG tablet Commonly known as: FLAGYL Take 500 mg by mouth 3 (three) times daily.   promethazine 25 MG tablet Commonly known as: PHENERGAN Take 25 mg by mouth every 6 (six) hours as needed for nausea or vomiting.   testosterone cypionate 200 MG/ML injection Commonly known as: DEPOTESTOSTERONE CYPIONATE INJECT 0.5ML IM EVERY 7  DAYS   therapeutic multivitamin-minerals tablet Take by mouth.   Turmeric 500 MG Tabs Take by mouth.   valsartan-hydrochlorothiazide 320-25 MG tablet Commonly known as: DIOVAN-HCT TAKE ONE TABLET BY MOUTH EACH DAY FOR BLOOD PRESSURE   vitamin B-12 500 MCG tablet Commonly known as: CYANOCOBALAMIN Take 500 mcg by mouth daily.   vitamin C 1000 MG tablet Take 1,000 mg by mouth daily.   zinc gluconate 50 MG tablet Take 50 mg by mouth daily.         Follow-up: Return if symptoms worsen or fail to improve, and as previously scheduled for  routine care.  Claretta Fraise, M.D.

## 2021-06-01 ENCOUNTER — Telehealth: Payer: Self-pay | Admitting: Family Medicine

## 2021-06-01 ENCOUNTER — Encounter: Payer: Self-pay | Admitting: Family Medicine

## 2021-06-01 NOTE — Telephone Encounter (Signed)
I put a letter on his MyChart for him.

## 2021-06-01 NOTE — Telephone Encounter (Signed)
Pt needs work note from being out of work Monday and Tuesday.. had appt with Dr Livia Snellen yesterday and forgot to ask for one. Please advise and call patient.

## 2021-06-01 NOTE — Telephone Encounter (Signed)
Called patient, no answer, left detailed message.

## 2021-06-08 ENCOUNTER — Encounter: Payer: Self-pay | Admitting: Family Medicine

## 2021-06-08 ENCOUNTER — Other Ambulatory Visit: Payer: Self-pay

## 2021-06-08 ENCOUNTER — Ambulatory Visit: Payer: 59 | Admitting: Family Medicine

## 2021-06-08 VITALS — BP 124/57 | HR 94 | Temp 97.4°F | Ht 73.0 in | Wt 267.4 lb

## 2021-06-08 DIAGNOSIS — G4733 Obstructive sleep apnea (adult) (pediatric): Secondary | ICD-10-CM | POA: Diagnosis not present

## 2021-06-08 DIAGNOSIS — R197 Diarrhea, unspecified: Secondary | ICD-10-CM | POA: Diagnosis not present

## 2021-06-08 DIAGNOSIS — K5792 Diverticulitis of intestine, part unspecified, without perforation or abscess without bleeding: Secondary | ICD-10-CM

## 2021-06-08 LAB — URINALYSIS
Bilirubin, UA: NEGATIVE
Ketones, UA: NEGATIVE
Leukocytes,UA: NEGATIVE
Nitrite, UA: NEGATIVE
Protein,UA: NEGATIVE
RBC, UA: NEGATIVE
Specific Gravity, UA: 1.02 (ref 1.005–1.030)
Urobilinogen, Ur: 0.2 mg/dL (ref 0.2–1.0)
pH, UA: 7 (ref 5.0–7.5)

## 2021-06-08 MED ORDER — TRAMADOL HCL 50 MG PO TABS: 50.0000 mg | ORAL_TABLET | Freq: Four times a day (QID) | ORAL | 1 refills | Status: AC | PRN

## 2021-06-08 MED ORDER — CIPROFLOXACIN HCL 500 MG PO TABS: 500.0000 mg | ORAL_TABLET | Freq: Two times a day (BID) | ORAL | 0 refills | Status: DC

## 2021-06-08 MED ORDER — METRONIDAZOLE 500 MG PO TABS: 500.0000 mg | ORAL_TABLET | Freq: Two times a day (BID) | ORAL | 0 refills | Status: DC

## 2021-06-08 NOTE — Progress Notes (Signed)
Subjective:  Patient ID: James [name], male    DOB: 07/31/1966  Age: 55 y.o. MRN: [id number]  CC: Follow-up (Follow up diverticulitis)   HPI Davontae Carmell [place] presents for Pain has diminished. Got weak due to not eating. Ate Lasagna and french fries for lunch. Today. 6/10 pain afterward. RLQ, not LLQ. Had 2 CTs confirming diverticulitis before. This pain is the same. No hematochezia. Finished antibiotics. Also having multiple LBM daily. Now concern for continued infection and requests a refill. Requests more time off from work.  Also needs CPAP supplies   Depression screen [facility] 2/9 06/08/2021 05/31/2021 05/31/2021  Decreased Interest 0 0 0  Down, Depressed, Hopeless 0 0 0  PHQ - 2 Score 0 0 0  Altered sleeping - 1 -  Tired, decreased energy - 1 -  Change in appetite - 2 -  Feeling bad or failure about yourself  - 0 -  Trouble concentrating - 0 -  Moving slowly or fidgety/restless - 0 -  Suicidal thoughts - - -  PHQ-9 Score - 4 -  Difficult doing work/chores - Not difficult at all -    History Herley has a past medical history of Hyperlipidemia and Hypertension.   He has a past surgical history that includes Back surgery and Tonsillectomy.   His family history includes Alzheimer's disease in his father; COPD in his mother.He reports that he has never smoked. He has never used smokeless tobacco. He reports that he does not drink alcohol and does not use drugs.    ROS Review of Systems  Constitutional:  Negative for chills, diaphoresis, fever and unexpected weight change.  HENT:  Negative for rhinorrhea and trouble swallowing.   Respiratory:  Negative for cough, chest tightness and shortness of breath.   Cardiovascular:  Negative for chest pain.  Gastrointestinal:  Positive for abdominal pain. Negative for abdominal distention, blood in stool, constipation, diarrhea, nausea, rectal pain and vomiting.  Genitourinary:  Negative for dysuria, flank pain and hematuria.   Musculoskeletal:  Negative for arthralgias and joint swelling.  Skin:  Negative for rash.  Neurological:  Negative for syncope and headaches.   Objective:  BP (!) 124/57   Pulse 94   Temp (!) 97.4 F (36.3 C)   Ht '6\' 1"'  (1.854 m)   Wt 267 lb 6.4 oz (121.3 kg)   SpO2 96%   BMI 35.28 kg/m   BP Readings from Last 3 Encounters:  06/08/21 (!) 124/57  05/04/21 139/82  01/05/21 130/89    Wt Readings from Last 3 Encounters:  06/08/21 267 lb 6.4 oz (121.3 kg)  05/31/21 271 lb 9.6 oz (123.2 kg)  05/04/21 280 lb 3.2 oz (127.1 kg)     Physical Exam Constitutional:      General: He is not in acute distress.    Appearance: He is well-developed.  HENT:     Head: Normocephalic and atraumatic.     Right Ear: External ear normal.     Left Ear: External ear normal.     Nose: Nose normal.  Eyes:     Conjunctiva/sclera: Conjunctivae normal.     Pupils: Pupils are equal, round, and reactive to light.  Cardiovascular:     Rate and Rhythm: Normal rate and regular rhythm.     Heart sounds: Normal heart sounds. No murmur heard. Pulmonary:     Effort: Pulmonary effort is normal. No respiratory distress.     Breath sounds: Normal breath sounds. No wheezing or rales.  Abdominal:  Palpations: Abdomen is soft.     Tenderness: There is abdominal tenderness (mild, RLQ).  Musculoskeletal:        General: Normal range of motion.     Cervical back: Normal range of motion and neck supple.  Skin:    General: Skin is warm and dry.  Neurological:     Mental Status: He is alert and oriented to person, place, and time.     Deep Tendon Reflexes: Reflexes are normal and symmetric.  Psychiatric:        Behavior: Behavior normal.        Thought Content: Thought content normal.        Judgment: Judgment normal.      Assessment & Plan:   Kayleb was seen today for follow-up.  Diagnoses and all orders for this visit:  Diverticulitis -     CBC with Differential/Platelet -     CMP14+EGFR -      Urinalysis -     Stool culture -     Ova and parasite examination -     Clostridium difficile EIA -     Urine Culture  Diarrhea of presumed infectious origin -     CBC with Differential/Platelet -     CMP14+EGFR -     Urinalysis -     Stool culture -     Ova and parasite examination -     Clostridium difficile EIA  Obstructive sleep apnea  Other orders -     metroNIDAZOLE (FLAGYL) 500 MG tablet; Take 1 tablet (500 mg total) by mouth 2 (two) times daily. -     ciprofloxacin (CIPRO) 500 MG tablet; Take 1 tablet (500 mg total) by mouth 2 (two) times daily. -     traMADol (ULTRAM) 50 MG tablet; Take 1 tablet (50 mg total) by mouth 4 (four) times daily as needed for up to 5 days for moderate pain.      I have changed [employer]'s metroNIDAZOLE and ciprofloxacin. I am also having him start on traMADol. Additionally, I am having him maintain his Turmeric, therapeutic multivitamin-minerals, blood glucose meter kit and supplies, Fish Oil, fenofibrate, vitamin C, cholecalciferol, vitamin B-12, fluticasone, zinc gluconate, metoprolol succinate, valsartan-hydrochlorothiazide, atorvastatin, testosterone cypionate, dapagliflozin propanediol, levothyroxine, diphenoxylate-atropine, and promethazine.  Allergies as of 06/08/2021       Reactions   Metformin And Related    Loose bloody stools        Medication List        Accurate as of June 08, 2021 11:59 PM. If you have any questions, ask your nurse or doctor.          atorvastatin 40 MG tablet Commonly known as: LIPITOR Take 1 tablet (40 mg total) by mouth daily. (NEEDS TO BE SEEN BEFORE NEXT REFILL)   blood glucose meter kit and supplies Dispense based on patient and insurance preference. Use up to four times daily as directed. (FOR ICD-10 E10.9, E11.9).   cholecalciferol 25 MCG (1000 UNIT) tablet Commonly known as: VITAMIN D3 Take 1,000 Units by mouth daily.   ciprofloxacin 500 MG tablet Commonly known as:  CIPRO Take 1 tablet (500 mg total) by mouth 2 (two) times daily.   dapagliflozin propanediol 10 MG Tabs tablet Commonly known as: Farxiga Take 1 tablet (10 mg total) by mouth daily before breakfast.   diphenoxylate-atropine 2.5-0.025 MG tablet Commonly known as: LOMOTIL Take by mouth 4 (four) times daily as needed for diarrhea or loose stools.   fenofibrate 160 MG  tablet TAKE ONE (1) TABLET EACH DAY   Fish Oil 1000 MG Caps Take 1,000 mg by mouth.   fluticasone 50 MCG/ACT nasal spray Commonly known as: FLONASE Place 1 spray into both nostrils 2 (two) times daily as needed for allergies or rhinitis.   levothyroxine 50 MCG tablet Commonly known as: SYNTHROID TAKE ONE (1) TABLET EACH DAY   metoprolol succinate 100 MG 24 hr tablet Commonly known as: TOPROL-XL TAKE 1 TABLET ONCE DAILY. TAKE WITH OR IMMEDIATELY FOLLOWING A MEAL   metroNIDAZOLE 500 MG tablet Commonly known as: FLAGYL Take 1 tablet (500 mg total) by mouth 2 (two) times daily. What changed: when to take this Changed by: Claretta Fraise, MD   promethazine 25 MG tablet Commonly known as: PHENERGAN Take 25 mg by mouth every 6 (six) hours as needed for nausea or vomiting.   testosterone cypionate 200 MG/ML injection Commonly known as: DEPOTESTOSTERONE CYPIONATE INJECT 0.5ML IM EVERY 7 DAYS   therapeutic multivitamin-minerals tablet Take by mouth.   traMADol 50 MG tablet Commonly known as: ULTRAM Take 1 tablet (50 mg total) by mouth 4 (four) times daily as needed for up to 5 days for moderate pain. Started by: Claretta Fraise, MD   Turmeric 500 MG Tabs Take by mouth.   valsartan-hydrochlorothiazide 320-25 MG tablet Commonly known as: DIOVAN-HCT TAKE ONE TABLET BY MOUTH EACH DAY FOR BLOOD PRESSURE   vitamin B-12 500 MCG tablet Commonly known as: CYANOCOBALAMIN Take 500 mcg by mouth daily.   vitamin C 1000 MG tablet Take 1,000 mg by mouth daily.   zinc gluconate 50 MG tablet Take 50 mg by mouth daily.          Follow-up: Return in about 2 weeks (around 06/22/2021), or if symptoms worsen or fail to improve.  Claretta Fraise, M.D.

## 2021-06-09 ENCOUNTER — Other Ambulatory Visit: Payer: 59

## 2021-06-09 LAB — CMP14+EGFR
ALT: 19 IU/L (ref 0–44)
AST: 26 IU/L (ref 0–40)
Albumin/Globulin Ratio: 1.1 — ABNORMAL LOW (ref 1.2–2.2)
Albumin: 3.6 g/dL — ABNORMAL LOW (ref 3.8–4.9)
Alkaline Phosphatase: 70 IU/L (ref 44–121)
BUN/Creatinine Ratio: 14 (ref 9–20)
BUN: 25 mg/dL — ABNORMAL HIGH (ref 6–24)
Bilirubin Total: 0.5 mg/dL (ref 0.0–1.2)
CO2: 26 mmol/L (ref 20–29)
Calcium: 9.7 mg/dL (ref 8.7–10.2)
Chloride: 98 mmol/L (ref 96–106)
Creatinine, Ser: 1.8 mg/dL — ABNORMAL HIGH (ref 0.76–1.27)
Globulin, Total: 3.2 g/dL (ref 1.5–4.5)
Glucose: 224 mg/dL — ABNORMAL HIGH (ref 65–99)
Potassium: 4.6 mmol/L (ref 3.5–5.2)
Sodium: 138 mmol/L (ref 134–144)
Total Protein: 6.8 g/dL (ref 6.0–8.5)
eGFR: 44 mL/min/{1.73_m2} — ABNORMAL LOW (ref >59)

## 2021-06-09 LAB — CBC WITH DIFFERENTIAL/PLATELET
Basophils Absolute: 0.1 10*3/uL (ref 0.0–0.2)
Basos: 1 %
EOS (ABSOLUTE): 0.2 10*3/uL (ref 0.0–0.4)
Eos: 2 %
Hematocrit: 52.5 % — ABNORMAL HIGH (ref 37.5–51.0)
Hemoglobin: 17.1 g/dL (ref 13.0–17.7)
Immature Grans (Abs): 0 10*3/uL (ref 0.0–0.1)
Immature Granulocytes: 0 %
Lymphocytes Absolute: 1.9 10*3/uL (ref 0.7–3.1)
Lymphs: 17 %
MCH: 29.6 pg (ref 26.6–33.0)
MCHC: 32.6 g/dL (ref 31.5–35.7)
MCV: 91 fL (ref 79–97)
Monocytes Absolute: 1.2 10*3/uL — ABNORMAL HIGH (ref 0.1–0.9)
Monocytes: 11 %
Neutrophils Absolute: 7.5 10*3/uL — ABNORMAL HIGH (ref 1.4–7.0)
Neutrophils: 69 %
Platelets: 290 10*3/uL (ref 150–450)
RBC: 5.77 x10E6/uL (ref 4.14–5.80)
RDW: 13.2 % (ref 11.6–15.4)
WBC: 10.8 10*3/uL (ref 3.4–10.8)

## 2021-06-11 LAB — URINE CULTURE: Organism ID, Bacteria: NO GROWTH

## 2021-06-12 ENCOUNTER — Encounter: Payer: Self-pay | Admitting: Family Medicine

## 2021-06-14 LAB — STOOL CULTURE: E coli, Shiga toxin Assay: NEGATIVE

## 2021-06-14 LAB — OVA AND PARASITE EXAMINATION

## 2021-06-15 NOTE — Progress Notes (Signed)
Hello Chanc,  Your lab result is normal and/or stable.Some minor variations that are not significant are commonly marked abnormal, but do not represent any medical problem for you.  Best regards, Claretta Fraise, M.D.

## 2021-06-16 ENCOUNTER — Ambulatory Visit: Payer: 59 | Admitting: Family Medicine

## 2021-06-24 ENCOUNTER — Telehealth: Payer: Self-pay | Admitting: Family Medicine

## 2021-06-24 NOTE — Telephone Encounter (Signed)
Patient needs appt for next Friday (07/01/21) to get stitches removed from stomach.  Please call to schedule. [phone]

## 2021-06-24 NOTE — Telephone Encounter (Signed)
Called and appt made - had abd surgery in [facility] released Thursday 7/7. Needs one week removal 7/15 appt made. Notes in CE and aware to bring note stating that PCP office can remove. Does not f/u with surgeon for 1 month.

## 2021-07-01 ENCOUNTER — Encounter: Payer: Self-pay | Admitting: Family Medicine

## 2021-07-01 ENCOUNTER — Other Ambulatory Visit: Payer: Self-pay

## 2021-07-01 ENCOUNTER — Ambulatory Visit: Payer: 59 | Admitting: Family Medicine

## 2021-07-01 VITALS — BP 147/85 | HR 121 | Temp 98.5°F | Ht 73.0 in | Wt 264.4 lb

## 2021-07-01 DIAGNOSIS — Z4802 Encounter for removal of sutures: Secondary | ICD-10-CM | POA: Diagnosis not present

## 2021-07-01 DIAGNOSIS — A0472 Enterocolitis due to Clostridium difficile, not specified as recurrent: Secondary | ICD-10-CM | POA: Diagnosis not present

## 2021-07-01 NOTE — Progress Notes (Signed)
Subjective: CC: Suture removal PCP: Claretta Fraise, MD [id number] James [name] is a 55 y.o. male presenting to clinic today for:  1.  Suture removal Patient here for staple removal from the abdomen.  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  He subsequently developed a C. difficile infection is currently being treated for this.  Overall bowel movements have improved though are still slightly loose.  No hematochezia or melena.  His appetite is still low but improving.  No reports of nausea, vomiting or fevers.  His wife notes that there has been some clear/bloody drainage from the base of the abdominal incision but otherwise he seems to be doing pretty well.  They have not yet heard from the surgeon for an appointment but expect to see them at some point.   ROS: Per HPI  Allergies  Allergen Reactions   Metformin And Related     Loose bloody stools   Past Medical History:  Diagnosis Date   Hyperlipidemia    Hypertension     Current Outpatient Medications:    acetaminophen (TYLENOL) 325 MG tablet, Take by mouth., Disp: , Rfl:    Ascorbic Acid (VITAMIN C) 1000 MG tablet, Take 1,000 mg by mouth daily., Disp: , Rfl:    atorvastatin (LIPITOR) 40 MG tablet, Take 1 tablet (40 mg total) by mouth daily. (NEEDS TO BE SEEN BEFORE NEXT REFILL), Disp: 30 tablet, Rfl: 0   blood glucose meter kit and supplies, Dispense based on patient and insurance preference. Use up to four times daily as directed. (FOR ICD-10 E10.9, E11.9)., Disp: 1 each, Rfl: 0   cholecalciferol (VITAMIN D3) 25 MCG (1000 UNIT) tablet, Take 1,000 Units by mouth daily., Disp: , Rfl:    ciprofloxacin (CIPRO) 500 MG tablet, Take 1 tablet (500 mg total) by mouth 2 (two) times daily., Disp: 14 tablet, Rfl: 0   dapagliflozin propanediol (FARXIGA) 10 MG TABS tablet, Take 1 tablet (10 mg total) by mouth daily before breakfast., Disp: 90 tablet, Rfl: 3   diphenoxylate-atropine  (LOMOTIL) 2.5-0.025 MG tablet, Take by mouth 4 (four) times daily as needed for diarrhea or loose stools., Disp: , Rfl:    enoxaparin (LOVENOX) 120 MG/0.8ML injection, Inject into the skin., Disp: , Rfl:    fenofibrate 160 MG tablet, TAKE ONE (1) TABLET EACH DAY, Disp: 90 tablet, Rfl: 2   fluticasone (FLONASE) 50 MCG/ACT nasal spray, Place 1 spray into both nostrils 2 (two) times daily as needed for allergies or rhinitis., Disp: 16 g, Rfl: 6   levothyroxine (SYNTHROID) 50 MCG tablet, TAKE ONE (1) TABLET EACH DAY, Disp: 90 tablet, Rfl: 1   metoprolol succinate (TOPROL-XL) 100 MG 24 hr tablet, TAKE 1 TABLET ONCE DAILY. TAKE WITH OR IMMEDIATELY FOLLOWING A MEAL, Disp: 90 tablet, Rfl: 1   metroNIDAZOLE (FLAGYL) 500 MG tablet, Take 1 tablet (500 mg total) by mouth 2 (two) times daily., Disp: 14 tablet, Rfl: 0   Omega-3 Fatty Acids (FISH OIL) 1000 MG CAPS, Take 1,000 mg by mouth., Disp: , Rfl:    oxyCODONE (OXY IR/ROXICODONE) 5 MG immediate release tablet, Take by mouth., Disp: , Rfl:    promethazine (PHENERGAN) 25 MG tablet, Take 25 mg by mouth every 6 (six) hours as needed for nausea or vomiting., Disp: , Rfl:    testosterone cypionate (DEPOTESTOSTERONE CYPIONATE) 200 MG/ML injection, INJECT 0.5ML IM EVERY 7 DAYS, Disp: 10 mL, Rfl: 0   therapeutic multivitamin-minerals (THERAGRAN-M) tablet, Take by mouth., Disp: ,  Rfl:    Turmeric 500 MG TABS, Take by mouth., Disp: , Rfl:    valsartan-hydrochlorothiazide (DIOVAN-HCT) 320-25 MG tablet, TAKE ONE TABLET BY MOUTH EACH DAY FOR BLOOD PRESSURE, Disp: 30 tablet, Rfl: 4   Vancomycin HCl 250 MG/5ML SOLR, Take by mouth., Disp: , Rfl:    vitamin B-12 (CYANOCOBALAMIN) 500 MCG tablet, Take 500 mcg by mouth daily., Disp: , Rfl:    zinc gluconate 50 MG tablet, Take 50 mg by mouth daily., Disp: , Rfl:  Social History   Socioeconomic History   Marital status: Married    Spouse name: Not on file   Number of children: Not on file   Years of education: Not on file    Highest education level: Not on file  Occupational History   Not on file  Tobacco Use   Smoking status: Never   Smokeless tobacco: Never  Vaping Use   Vaping Use: Never used  Substance and Sexual Activity   Alcohol use: No   Drug use: No   Sexual activity: Yes    Birth control/protection: None  Other Topics Concern   Not on file  Social History Narrative   Not on file   Social Determinants of Health   Financial Resource Strain: Not on file  Food Insecurity: Not on file  Transportation Needs: Not on file  Physical Activity: Not on file  Stress: Not on file  Social Connections: Not on file  Intimate Partner Violence: Not on file   Family History  Problem Relation Age of Onset   COPD Mother    Alzheimer's disease Father     Objective: Office vital signs reviewed. BP (!) 147/85   Pulse (!) 121   Temp 98.5 F (36.9 C)   Ht _0  (1.854 m)   Wt 264 lb 6.4 oz (119.9 kg)   SpO2 94%   BMI 34.88 kg/m   Physical Examination:  General: Awake, alert, well nourished, No acute distress GI: Distended, some healing ecchymosis noted in the periumbilical aspect of the abdomen.  He has a very large, vertical postsurgical site with several staples in place.  There is no evidence of secondary bacterial infection.  He has a dressing in place with what appears to be serosanguineous fluid.  No odors.  No purulence.  Assessment/ Plan: 55 y.o. male   Encounter for staple removal  C. difficile diarrhea  13 staples were removed.  I left the last several staples in at the inferior aspect of the abdomen as there appear to have slight dehiscence in this area and I was worried that he would dehisce totally.  His abdomen was cleaned, triple antibiotic cream applied and redressed with a nonstick dressing.  Dressing has been sent home with his wife as well.  At this time no evidence of infection but his abdomen remains quite distended.  Follow-up on Monday to have abdomen reassessed along with  remaining staples removed.  Will CC to his general surgeon as he has not had a phone call with follow-up appointment instructions yet.  He still has a few days on his Flagyl for treatment of C. difficile.  Clinically getting better  No orders of the defined types were placed in this encounter.  No orders of the defined types were placed in this encounter.    Janora Norlander, DO [place] [phone]

## 2021-07-04 ENCOUNTER — Encounter: Payer: Self-pay | Admitting: Family Medicine

## 2021-07-04 ENCOUNTER — Ambulatory Visit: Payer: 59 | Admitting: Family Medicine

## 2021-07-04 ENCOUNTER — Other Ambulatory Visit: Payer: Self-pay

## 2021-07-04 VITALS — BP 133/82 | HR 119 | Temp 98.2°F | Ht 73.0 in | Wt 252.6 lb

## 2021-07-04 DIAGNOSIS — I81 Portal vein thrombosis: Secondary | ICD-10-CM | POA: Diagnosis not present

## 2021-07-06 ENCOUNTER — Encounter: Payer: Self-pay | Admitting: Family Medicine

## 2021-07-06 NOTE — Progress Notes (Signed)
Subjective:  Patient ID: James [name], male    DOB: 1966-11-30  Age: 55 y.o. MRN: [id number]  CC: Suture / Staple Removal (Seen Friday )   HPI James [name] presents for removal of sutures.  He had a midline laparotomy for portal system thrombosis.  He had been treated with for diverticulitis based on a CT scan showing that diverticulitis was present.  He did not improve with repeated treatment and eventually had to go back to the emergency room where he was found to have DVT of the portal circulation.  He is no longer taking testosterone.  This is due to the thrombosis and the concern that recurrence may have been.  It is a known side effect of testosterone treatment to have DVT.  James [name] states he has an appointment with hematology coming up in a couple of days.  At that time they will determine if he needs to be on long-term lifelong anticoagulation.  He is also supposed to see his surgeon next week for follow-up on his procedure.  Today he needs to have the remaining sutures removed.  The staples above the umbilicus were removed 3 days ago.  He has several below the umbilicus that were not ready for removal that are now..  Depression screen [facility] 2/9 07/04/2021 07/01/2021 06/08/2021  Decreased Interest 0 0 0  Down, Depressed, Hopeless 0 0 0  PHQ - 2 Score 0 0 0  Altered sleeping - 0 -  Tired, decreased energy - 2 -  Change in appetite - 1 -  Feeling bad or failure about yourself  - 0 -  Trouble concentrating - 0 -  Moving slowly or fidgety/restless - 0 -  Suicidal thoughts - 0 -  PHQ-9 Score - 3 -  Difficult doing work/chores - Not difficult at all -    History James [name] has a past medical history of Hyperlipidemia and Hypertension.   He has a past surgical history that includes Back surgery and Tonsillectomy.   His family history includes Alzheimer's disease in his father; COPD in his mother.He reports that he has never smoked. He has never used smokeless tobacco. He reports that he  does not drink alcohol and does not use drugs.    ROS Review of Systems  Constitutional:  Positive for fatigue. Negative for fever.  Respiratory:  Negative for shortness of breath.   Cardiovascular:  Negative for chest pain.  Gastrointestinal:  Positive for abdominal distention and abdominal pain.  Musculoskeletal:  Negative for arthralgias.  Skin:  Negative for rash.   Objective:  BP 133/82   Pulse (!) 119   Temp 98.2 F (36.8 C) (Temporal)   Ht '6\' 1"'  (1.854 m)   Wt 252 lb 9.6 oz (114.6 kg)   SpO2 98%   BMI 33.33 kg/m   BP Readings from Last 3 Encounters:  07/04/21 133/82  07/01/21 (!) 147/85  06/08/21 (!) 124/57    Wt Readings from Last 3 Encounters:  07/04/21 252 lb 9.6 oz (114.6 kg)  07/01/21 264 lb 6.4 oz (119.9 kg)  06/08/21 267 lb 6.4 oz (121.3 kg)     Physical Exam Vitals reviewed.  Constitutional:      Appearance: He is well-developed.  HENT:     Head: Normocephalic and atraumatic.     Right Ear: External ear normal.     Left Ear: External ear normal.     Mouth/Throat:     Pharynx: No oropharyngeal exudate or posterior oropharyngeal erythema.  Eyes:  Pupils: Pupils are equal, round, and reactive to light.  Cardiovascular:     Rate and Rhythm: Normal rate and regular rhythm.     Heart sounds: No murmur heard. Pulmonary:     Effort: No respiratory distress.     Breath sounds: Normal breath sounds.  Musculoskeletal:     Cervical back: Normal range of motion and neck supple.  Neurological:     Mental Status: He is alert and oriented to person, place, and time.    A line of intact staples from the umbilicus distally is noted.  There is mild hyperemia surrounding them.  There is significant abdominal distention.  There is no excessive tension on the staple line.  The staples were removed without difficulty 11 in number.  Assessment & Plan:   James [name] was seen today for suture / staple removal.  Diagnoses and all orders for this visit:  Portal  vein thrombosis      I have discontinued James [name]'s metroNIDAZOLE and ciprofloxacin. I am also having James [name] maintain his Turmeric, therapeutic multivitamin-minerals, blood glucose meter kit and supplies, Fish Oil, fenofibrate, vitamin C, cholecalciferol, vitamin B-12, fluticasone, zinc gluconate, metoprolol succinate, valsartan-hydrochlorothiazide, atorvastatin, testosterone cypionate, dapagliflozin propanediol, levothyroxine, diphenoxylate-atropine, promethazine, acetaminophen, enoxaparin, oxyCODONE, and Vancomycin HCl.  Allergies as of 07/04/2021       Reactions   Metformin And Related    Loose bloody stools        Medication List        Accurate as of July 04, 2021 11:59 PM. If you have any questions, ask your nurse or doctor.          STOP taking these medications    ciprofloxacin 500 MG tablet Commonly known as: CIPRO Stopped by: Claretta Fraise, MD   metroNIDAZOLE 500 MG tablet Commonly known as: FLAGYL Stopped by: Claretta Fraise, MD       TAKE these medications    acetaminophen 325 MG tablet Commonly known as: TYLENOL Take by mouth.   atorvastatin 40 MG tablet Commonly known as: LIPITOR Take 1 tablet (40 mg total) by mouth daily. (NEEDS TO BE SEEN BEFORE NEXT REFILL)   blood glucose meter kit and supplies Dispense based on patient and insurance preference. Use up to four times daily as directed. (FOR ICD-10 E10.9, E11.9).   cholecalciferol 25 MCG (1000 UNIT) tablet Commonly known as: VITAMIN D3 Take 1,000 Units by mouth daily.   dapagliflozin propanediol 10 MG Tabs tablet Commonly known as: Farxiga Take 1 tablet (10 mg total) by mouth daily before breakfast.   diphenoxylate-atropine 2.5-0.025 MG tablet Commonly known as: LOMOTIL Take by mouth 4 (four) times daily as needed for diarrhea or loose stools.   enoxaparin 120 MG/0.8ML injection Commonly known as: LOVENOX Inject into the skin.   fenofibrate 160 MG tablet TAKE ONE (1) TABLET EACH  DAY   Fish Oil 1000 MG Caps Take 1,000 mg by mouth.   fluticasone 50 MCG/ACT nasal spray Commonly known as: FLONASE Place 1 spray into both nostrils 2 (two) times daily as needed for allergies or rhinitis.   levothyroxine 50 MCG tablet Commonly known as: SYNTHROID TAKE ONE (1) TABLET EACH DAY   metoprolol succinate 100 MG 24 hr tablet Commonly known as: TOPROL-XL TAKE 1 TABLET ONCE DAILY. TAKE WITH OR IMMEDIATELY FOLLOWING A MEAL   oxyCODONE 5 MG immediate release tablet Commonly known as: Oxy IR/ROXICODONE Take by mouth.   promethazine 25 MG tablet Commonly known as: PHENERGAN Take 25 mg by mouth every 6 (six)  hours as needed for nausea or vomiting.   testosterone cypionate 200 MG/ML injection Commonly known as: DEPOTESTOSTERONE CYPIONATE INJECT 0.5ML IM EVERY 7 DAYS   therapeutic multivitamin-minerals tablet Take by mouth.   Turmeric 500 MG Tabs Take by mouth.   valsartan-hydrochlorothiazide 320-25 MG tablet Commonly known as: DIOVAN-HCT TAKE ONE TABLET BY MOUTH EACH DAY FOR BLOOD PRESSURE   Vancomycin HCl 250 MG/5ML Solr Take by mouth.   vitamin B-12 500 MCG tablet Commonly known as: CYANOCOBALAMIN Take 500 mcg by mouth daily.   vitamin C 1000 MG tablet Take 1,000 mg by mouth daily.   zinc gluconate 50 MG tablet Take 50 mg by mouth daily.         Follow-up: No follow-ups on file.  Claretta Fraise, M.D.

## 2021-07-27 ENCOUNTER — Encounter: Payer: Self-pay | Admitting: Family Medicine

## 2021-07-27 ENCOUNTER — Other Ambulatory Visit: Payer: Self-pay

## 2021-07-27 ENCOUNTER — Ambulatory Visit: Payer: 59 | Admitting: Family Medicine

## 2021-07-27 VITALS — BP 109/66 | HR 99 | Temp 97.2°F | Ht 73.0 in | Wt 215.0 lb

## 2021-07-27 DIAGNOSIS — I81 Portal vein thrombosis: Secondary | ICD-10-CM

## 2021-07-27 DIAGNOSIS — A0472 Enterocolitis due to Clostridium difficile, not specified as recurrent: Secondary | ICD-10-CM | POA: Diagnosis not present

## 2021-07-27 LAB — CBC WITH DIFFERENTIAL/PLATELET
Basophils Absolute: 0.1 10*3/uL (ref 0.0–0.2)
Basos: 1 %
EOS (ABSOLUTE): 0.1 10*3/uL (ref 0.0–0.4)
Eos: 1 %
Hematocrit: 42.8 % (ref 37.5–51.0)
Hemoglobin: 13.2 g/dL (ref 13.0–17.7)
Immature Grans (Abs): 0 10*3/uL (ref 0.0–0.1)
Immature Granulocytes: 0 %
Lymphocytes Absolute: 1.6 10*3/uL (ref 0.7–3.1)
Lymphs: 20 %
MCH: 27.3 pg (ref 26.6–33.0)
MCHC: 30.8 g/dL — ABNORMAL LOW (ref 31.5–35.7)
MCV: 89 fL (ref 79–97)
Monocytes Absolute: 0.8 10*3/uL (ref 0.1–0.9)
Monocytes: 10 %
Neutrophils Absolute: 5.2 10*3/uL (ref 1.4–7.0)
Neutrophils: 68 %
Platelets: 187 10*3/uL (ref 150–450)
RBC: 4.83 x10E6/uL (ref 4.14–5.80)
RDW: 14.2 % (ref 11.6–15.4)
WBC: 7.7 10*3/uL (ref 3.4–10.8)

## 2021-07-27 LAB — CMP14+EGFR
ALT: 20 IU/L (ref 0–44)
AST: 17 IU/L (ref 0–40)
Albumin/Globulin Ratio: 1.3 (ref 1.2–2.2)
Albumin: 4.4 g/dL (ref 3.8–4.9)
Alkaline Phosphatase: 162 IU/L — ABNORMAL HIGH (ref 44–121)
BUN/Creatinine Ratio: 14 (ref 9–20)
BUN: 15 mg/dL (ref 6–24)
Bilirubin Total: 0.5 mg/dL (ref 0.0–1.2)
CO2: 24 mmol/L (ref 20–29)
Calcium: 9.9 mg/dL (ref 8.7–10.2)
Chloride: 100 mmol/L (ref 96–106)
Creatinine, Ser: 1.05 mg/dL (ref 0.76–1.27)
Globulin, Total: 3.3 g/dL (ref 1.5–4.5)
Glucose: 121 mg/dL — ABNORMAL HIGH (ref 65–99)
Potassium: 4.1 mmol/L (ref 3.5–5.2)
Sodium: 141 mmol/L (ref 134–144)
Total Protein: 7.7 g/dL (ref 6.0–8.5)
eGFR: 85 mL/min/{1.73_m2} (ref >59)

## 2021-07-27 MED ORDER — VANCOMYCIN HCL 125 MG PO CAPS: 125.0000 mg | ORAL_CAPSULE | Freq: Four times a day (QID) | ORAL | 0 refills | Status: AC

## 2021-07-27 NOTE — Progress Notes (Signed)
Subjective:  Patient ID: James [name], male    DOB: [birth year]  Age: 55 y.o. MRN: [id number]  CC: Follow-up (C DIFF)   HPI Shelby ASHTAN GIRTMAN presents for diarrhea 8-15 a day for three weeks. Now down to 5-6 per day. Dx with C. Diff. Out of meds for quite a while. Had surgery for portal vein thrombosis prior to this.   Depression screen [facility] 2/9 07/27/2021 07/27/2021 07/27/2021  Decreased Interest 0 0 0  Down, Depressed, Hopeless 0 0 0  PHQ - 2 Score 0 0 0  Altered sleeping 0 - -  Tired, decreased energy 1 - -  Change in appetite 1 - -  Feeling bad or failure about yourself  0 - -  Trouble concentrating 0 - -  Moving slowly or fidgety/restless 0 - -  Suicidal thoughts 0 - -  PHQ-9 Score 2 - -  Difficult doing work/chores Not difficult at all - -  Some recent data might be hidden    History Gradie has a past medical history of Hyperlipidemia and Hypertension.   He has a past surgical history that includes Back surgery and Tonsillectomy.   His family history includes Alzheimer's disease in his father; COPD in his mother.He reports that he has never smoked. He has never used smokeless tobacco. He reports that he does not drink alcohol and does not use drugs.    ROS Review of Systems  Constitutional:  Negative for chills, diaphoresis, fever and unexpected weight change.  HENT:  Negative for rhinorrhea and trouble swallowing.   Respiratory:  Negative for cough, chest tightness and shortness of breath.   Cardiovascular:  Negative for chest pain.  Gastrointestinal:  Positive for abdominal pain (diffuse). Negative for abdominal distention, blood in stool, constipation, diarrhea, nausea, rectal pain and vomiting.  Genitourinary:  Negative for dysuria, flank pain and hematuria.  Musculoskeletal:  Negative for arthralgias and joint swelling.  Skin:  Negative for rash.  Neurological:  Negative for syncope and headaches.   Objective:  BP 109/66   Pulse 99   Temp (!) 97.2 F (36.2 C)    Ht '6\' 1"'  (1.854 m)   Wt 215 lb (97.5 kg) Comment: recent gastro illness with weight loss  SpO2 95%   BMI 28.37 kg/m   BP Readings from Last 3 Encounters:  07/27/21 109/66  07/04/21 133/82  07/01/21 (!) 147/85    Wt Readings from Last 3 Encounters:  07/27/21 215 lb (97.5 kg)  07/04/21 252 lb 9.6 oz (114.6 kg)  07/01/21 264 lb 6.4 oz (119.9 kg)     Physical Exam Vitals reviewed.  Constitutional:      Appearance: He is well-developed.  HENT:     Head: Normocephalic and atraumatic.     Right Ear: External ear normal.     Left Ear: External ear normal.     Mouth/Throat:     Pharynx: No oropharyngeal exudate or posterior oropharyngeal erythema.  Eyes:     Pupils: Pupils are equal, round, and reactive to light.  Cardiovascular:     Rate and Rhythm: Normal rate and regular rhythm.     Heart sounds: No murmur heard. Pulmonary:     Effort: No respiratory distress.     Breath sounds: Normal breath sounds.  Abdominal:     General: There is distension.     Tenderness: There is abdominal tenderness (mild & diffuse).  Musculoskeletal:     Cervical back: Normal range of motion and neck supple.  Neurological:  Mental Status: He is alert and oriented to person, place, and time.      Assessment & Plan:   Micaiah was seen today for follow-up.  Diagnoses and all orders for this visit:  Colitis due to Clostridium difficile -     CBC with Differential/Platelet -     CMP14+EGFR -     Cancel: Clostridium difficile EIA -     Ambulatory referral to Gastroenterology -     Clostridium Difficile by PCR([facility])  Portal vein thrombosis  Other orders -     vancomycin (VANCOCIN) 125 MG capsule; Take 1 capsule (125 mg total) by mouth 4 (four) times daily for 10 days.      I have discontinued Maricela W. Pangallo's Vancomycin HCl. I am also having him start on vancomycin. Additionally, I am having him maintain his Turmeric, therapeutic multivitamin-minerals, blood glucose  meter kit and supplies, Fish Oil, fenofibrate, vitamin C, cholecalciferol, vitamin B-12, fluticasone, zinc gluconate, metoprolol succinate, valsartan-hydrochlorothiazide, atorvastatin, testosterone cypionate, dapagliflozin propanediol, levothyroxine, diphenoxylate-atropine, promethazine, acetaminophen, enoxaparin, and oxyCODONE.  Allergies as of 07/27/2021       Reactions   Metformin And Related    Loose bloody stools        Medication List        Accurate as of July 27, 2021 11:59 PM. If you have any questions, ask your nurse or doctor.          STOP taking these medications    Vancomycin HCl 250 MG/5ML Solr Replaced by: vancomycin 125 MG capsule Stopped by: Claretta Fraise, MD       TAKE these medications    acetaminophen 325 MG tablet Commonly known as: TYLENOL Take by mouth.   atorvastatin 40 MG tablet Commonly known as: LIPITOR Take 1 tablet (40 mg total) by mouth daily. (NEEDS TO BE SEEN BEFORE NEXT REFILL)   blood glucose meter kit and supplies Dispense based on patient and insurance preference. Use up to four times daily as directed. (FOR ICD-10 E10.9, E11.9).   cholecalciferol 25 MCG (1000 UNIT) tablet Commonly known as: VITAMIN D3 Take 1,000 Units by mouth daily.   dapagliflozin propanediol 10 MG Tabs tablet Commonly known as: Farxiga Take 1 tablet (10 mg total) by mouth daily before breakfast.   diphenoxylate-atropine 2.5-0.025 MG tablet Commonly known as: LOMOTIL Take by mouth 4 (four) times daily as needed for diarrhea or loose stools.   enoxaparin 120 MG/0.8ML injection Commonly known as: LOVENOX Inject into the skin.   fenofibrate 160 MG tablet TAKE ONE (1) TABLET EACH DAY   Fish Oil 1000 MG Caps Take 1,000 mg by mouth.   fluticasone 50 MCG/ACT nasal spray Commonly known as: FLONASE Place 1 spray into both nostrils 2 (two) times daily as needed for allergies or rhinitis.   levothyroxine 50 MCG tablet Commonly known as:  SYNTHROID TAKE ONE (1) TABLET EACH DAY   metoprolol succinate 100 MG 24 hr tablet Commonly known as: TOPROL-XL TAKE 1 TABLET ONCE DAILY. TAKE WITH OR IMMEDIATELY FOLLOWING A MEAL   oxyCODONE 5 MG immediate release tablet Commonly known as: Oxy IR/ROXICODONE Take by mouth.   promethazine 25 MG tablet Commonly known as: PHENERGAN Take 25 mg by mouth every 6 (six) hours as needed for nausea or vomiting.   testosterone cypionate 200 MG/ML injection Commonly known as: DEPOTESTOSTERONE CYPIONATE INJECT 0.5ML IM EVERY 7 DAYS   therapeutic multivitamin-minerals tablet Take by mouth.   Turmeric 500 MG Tabs Take by mouth.   valsartan-hydrochlorothiazide 320-25 MG tablet Commonly  known as: DIOVAN-HCT TAKE ONE TABLET BY MOUTH EACH DAY FOR BLOOD PRESSURE   vancomycin 125 MG capsule Commonly known as: VANCOCIN Take 1 capsule (125 mg total) by mouth 4 (four) times daily for 10 days. Replaces: Vancomycin HCl 250 MG/5ML Solr Started by: Claretta Fraise, MD   vitamin B-12 500 MCG tablet Commonly known as: CYANOCOBALAMIN Take 500 mcg by mouth daily.   vitamin C 1000 MG tablet Take 1,000 mg by mouth daily.   zinc gluconate 50 MG tablet Take 50 mg by mouth daily.       Brings in [employer] paperwork. Due to nature of his surgery and subsequent C. Diff, he will need to be out of work until at least 10-16-21  Follow-up: No follow-ups on file.  Claretta Fraise, M.D.

## 2021-07-28 LAB — CLOSTRIDIUM DIFFICILE BY PCR: Toxigenic C. Difficile by PCR: POSITIVE — AB

## 2021-08-01 ENCOUNTER — Telehealth: Payer: Self-pay | Admitting: Family Medicine

## 2021-08-01 ENCOUNTER — Encounter: Payer: Self-pay | Admitting: Family Medicine

## 2021-08-02 ENCOUNTER — Other Ambulatory Visit: Payer: Self-pay | Admitting: Family Medicine

## 2021-08-02 DIAGNOSIS — E1169 Type 2 diabetes mellitus with other specified complication: Secondary | ICD-10-CM

## 2021-08-02 NOTE — Telephone Encounter (Signed)
LMOVM need to get some dates from pt, now that [place] notes are completed

## 2021-08-08 NOTE — Telephone Encounter (Signed)
Dates were received last week, FMLA placed on providers desk on 08/02/21

## 2021-08-10 ENCOUNTER — Encounter: Payer: Self-pay | Admitting: Family Medicine

## 2021-08-10 ENCOUNTER — Other Ambulatory Visit: Payer: Self-pay | Admitting: Family Medicine

## 2021-08-10 ENCOUNTER — Other Ambulatory Visit: Payer: Self-pay

## 2021-08-10 ENCOUNTER — Ambulatory Visit: Payer: 59 | Admitting: Family Medicine

## 2021-08-10 VITALS — BP 120/71 | HR 95 | Temp 98.2°F | Ht 73.0 in | Wt 212.4 lb

## 2021-08-10 DIAGNOSIS — A0472 Enterocolitis due to Clostridium difficile, not specified as recurrent: Secondary | ICD-10-CM

## 2021-08-10 MED ORDER — VANCOMYCIN HCL 125 MG PO CAPS: 125.0000 mg | ORAL_CAPSULE | Freq: Four times a day (QID) | ORAL | 0 refills | Status: AC

## 2021-08-10 NOTE — Progress Notes (Signed)
Subjective:  Patient ID: James [name], male    DOB: 01-19-1966, 55 y.o.   MRN: [id number]  Patient Care Team: Claretta Fraise, MD as PCP - General (Family Medicine)   Chief Complaint:  CLOSTRIDIUM DIFFICLE (2 Week Rck /)   HPI: James [name] is a 55 y.o. male presenting on 08/10/2021 for CLOSTRIDIUM DIFFICLE (2 Week Rck /)   Pt presents today for reevaluation of C-Diff. He was placed on oral vancomycin and completed a 10 day course. He reports he is still having 5-8 diarrhea stools per day. He denies melena or hematochezia. No fever, chills, weakness, confusion, or decreased urination.     Relevant past medical, surgical, family, and social history reviewed and updated as indicated.  Allergies and medications reviewed and updated. Data reviewed: Chart in Epic.   Past Medical History:  Diagnosis Date   Hyperlipidemia    Hypertension     Past Surgical History:  Procedure Laterality Date   BACK SURGERY     TONSILLECTOMY      Social History   Socioeconomic History   Marital status: Married    Spouse name: Not on file   Number of children: Not on file   Years of education: Not on file   Highest education level: Not on file  Occupational History   Not on file  Tobacco Use   Smoking status: Never   Smokeless tobacco: Never  Vaping Use   Vaping Use: Never used  Substance and Sexual Activity   Alcohol use: No   Drug use: No   Sexual activity: Yes    Birth control/protection: None  Other Topics Concern   Not on file  Social History Narrative   Not on file   Social Determinants of Health   Financial Resource Strain: Not on file  Food Insecurity: Not on file  Transportation Needs: Not on file  Physical Activity: Not on file  Stress: Not on file  Social Connections: Not on file  Intimate Partner Violence: Not on file    Outpatient Encounter Medications as of 08/10/2021  Medication Sig   acetaminophen (TYLENOL) 325 MG tablet Take by mouth.   Ascorbic  Acid (VITAMIN C) 1000 MG tablet Take 1,000 mg by mouth daily.   atorvastatin (LIPITOR) 40 MG tablet TAKE ONE (1) TABLET EACH DAY   blood glucose meter kit and supplies Dispense based on patient and insurance preference. Use up to four times daily as directed. (FOR ICD-10 E10.9, E11.9).   cholecalciferol (VITAMIN D3) 25 MCG (1000 UNIT) tablet Take 1,000 Units by mouth daily.   dapagliflozin propanediol (FARXIGA) 10 MG TABS tablet Take 1 tablet (10 mg total) by mouth daily before breakfast.   diphenoxylate-atropine (LOMOTIL) 2.5-0.025 MG tablet Take by mouth 4 (four) times daily as needed for diarrhea or loose stools.   fenofibrate 160 MG tablet TAKE ONE (1) TABLET EACH DAY   fluticasone (FLONASE) 50 MCG/ACT nasal spray Place 1 spray into both nostrils 2 (two) times daily as needed for allergies or rhinitis.   fondaparinux (ARIXTRA) 7.5 MG/0.6ML SOLN injection SMARTSIG:0.6 Milliliter(s) SUB-Q Daily   levothyroxine (SYNTHROID) 50 MCG tablet TAKE ONE (1) TABLET EACH DAY   metoprolol succinate (TOPROL-XL) 100 MG 24 hr tablet TAKE 1 TABLET ONCE DAILY. TAKE WITH OR IMMEDIATELY FOLLOWING A MEAL   Omega-3 Fatty Acids (FISH OIL) 1000 MG CAPS Take 1,000 mg by mouth.   oxyCODONE (OXY IR/ROXICODONE) 5 MG immediate release tablet Take by mouth.   promethazine (PHENERGAN) 25 MG  tablet Take 25 mg by mouth every 6 (six) hours as needed for nausea or vomiting.   testosterone cypionate (DEPOTESTOSTERONE CYPIONATE) 200 MG/ML injection INJECT 0.5ML IM EVERY 7 DAYS   therapeutic multivitamin-minerals (THERAGRAN-M) tablet Take by mouth.   Turmeric 500 MG TABS Take by mouth.   valsartan-hydrochlorothiazide (DIOVAN-HCT) 320-25 MG tablet TAKE ONE TABLET BY MOUTH EACH DAY FOR BLOOD PRESSURE   vancomycin (VANCOCIN) 125 MG capsule Take 1 capsule (125 mg total) by mouth 4 (four) times daily for 7 days.   vitamin B-12 (CYANOCOBALAMIN) 500 MCG tablet Take 500 mcg by mouth daily.   zinc gluconate 50 MG tablet Take 50 mg by mouth  daily.   [DISCONTINUED] enoxaparin (LOVENOX) 120 MG/0.8ML injection Inject into the skin.   No facility-administered encounter medications on file as of 08/10/2021.    Allergies  Allergen Reactions   Metformin And Related     Loose bloody stools    Review of Systems  Constitutional:  Negative for activity change, appetite change, chills, diaphoresis, fatigue, fever and unexpected weight change.  HENT: Negative.    Eyes: Negative.   Respiratory:  Negative for cough, chest tightness and shortness of breath.   Cardiovascular:  Negative for chest pain, palpitations and leg swelling.  Gastrointestinal:  Positive for abdominal pain and diarrhea. Negative for abdominal distention, anal bleeding, blood in stool, constipation, nausea, rectal pain and vomiting.  Endocrine: Negative.   Genitourinary:  Negative for decreased urine volume, difficulty urinating, dysuria, frequency and urgency.  Musculoskeletal:  Negative for arthralgias and myalgias.  Skin: Negative.   Allergic/Immunologic: Negative.   Neurological:  Negative for dizziness, weakness and headaches.  Hematological: Negative.   Psychiatric/Behavioral:  Negative for confusion, hallucinations, sleep disturbance and suicidal ideas.   All other systems reviewed and are negative.      Objective:  BP 120/71   Pulse 95   Temp 98.2 F (36.8 C) (Temporal)   Ht '6\' 1"'  (1.854 m)   Wt 212 lb 6.4 oz (96.3 kg)   SpO2 100%   BMI 28.02 kg/m    Wt Readings from Last 3 Encounters:  08/10/21 212 lb 6.4 oz (96.3 kg)  07/27/21 215 lb (97.5 kg)  07/04/21 252 lb 9.6 oz (114.6 kg)    Physical Exam Vitals and nursing note reviewed.  Constitutional:      General: He is not in acute distress.    Appearance: Normal appearance. He is well-developed and well-groomed. He is not ill-appearing, toxic-appearing or diaphoretic.  HENT:     Head: Normocephalic and atraumatic.     Jaw: There is normal jaw occlusion.     Right Ear: Hearing normal.      Left Ear: Hearing normal.     Nose: Nose normal.     Mouth/Throat:     Lips: Pink.     Mouth: Mucous membranes are moist.     Pharynx: Oropharynx is clear. Uvula midline.  Eyes:     General: Lids are normal.     Extraocular Movements: Extraocular movements intact.     Conjunctiva/sclera: Conjunctivae normal.     Pupils: Pupils are equal, round, and reactive to light.  Neck:     Thyroid: No thyroid mass, thyromegaly or thyroid tenderness.     Vascular: No carotid bruit or JVD.     Trachea: Trachea and phonation normal.  Cardiovascular:     Rate and Rhythm: Normal rate and regular rhythm.     Chest Wall: PMI is not displaced.     Pulses:  Normal pulses.     Heart sounds: Normal heart sounds. No murmur heard.   No friction rub. No gallop.  Pulmonary:     Effort: Pulmonary effort is normal. No respiratory distress.     Breath sounds: Normal breath sounds. No wheezing.  Abdominal:     General: Abdomen is flat. Bowel sounds are normal. There is no distension or abdominal bruit. There are no signs of injury.     Palpations: Abdomen is soft. There is no shifting dullness, fluid wave, hepatomegaly, splenomegaly, mass or pulsatile mass.     Tenderness: There is generalized abdominal tenderness (mild). There is no right CVA tenderness, left CVA tenderness, guarding or rebound.     Hernia: No hernia is present.  Musculoskeletal:        General: Normal range of motion.     Cervical back: Normal range of motion and neck supple.     Right lower leg: No edema.     Left lower leg: No edema.  Lymphadenopathy:     Cervical: No cervical adenopathy.  Skin:    General: Skin is warm and dry.     Capillary Refill: Capillary refill takes less than 2 seconds.     Coloration: Skin is not cyanotic, jaundiced or pale.     Findings: No rash.  Neurological:     General: No focal deficit present.     Mental Status: He is alert and oriented to person, place, and time.     Cranial Nerves: Cranial nerves  are intact.     Sensory: Sensation is intact.     Motor: Motor function is intact.     Coordination: Coordination is intact.     Gait: Gait is intact.     Deep Tendon Reflexes: Reflexes are normal and symmetric.  Psychiatric:        Attention and Perception: Attention and perception normal.        Mood and Affect: Mood and affect normal.        Speech: Speech normal.        Behavior: Behavior normal. Behavior is cooperative.        Thought Content: Thought content normal.        Cognition and Memory: Cognition and memory normal.        Judgment: Judgment normal.    Results for orders placed or performed in visit on 07/27/21  Clostridium Difficile by PCR([facility])   Specimen: STOOL   ST  Result Value Ref Range   Toxigenic C. Difficile by PCR Positive (A) Negative  CBC with Differential/Platelet  Result Value Ref Range   WBC 7.7 3.4 - 10.8 x10E3/uL   RBC 4.83 4.14 - 5.80 x10E6/uL   Hemoglobin 13.2 13.0 - 17.7 g/dL   Hematocrit 42.8 37.5 - 51.0 %   MCV 89 79 - 97 fL   MCH 27.3 26.6 - 33.0 pg   MCHC 30.8 (L) 31.5 - 35.7 g/dL   RDW 14.2 11.6 - 15.4 %   Platelets 187 150 - 450 x10E3/uL   Neutrophils 68 Not Estab. %   Lymphs 20 Not Estab. %   Monocytes 10 Not Estab. %   Eos 1 Not Estab. %   Basos 1 Not Estab. %   Neutrophils Absolute 5.2 1.4 - 7.0 x10E3/uL   Lymphocytes Absolute 1.6 0.7 - 3.1 x10E3/uL   Monocytes Absolute 0.8 0.1 - 0.9 x10E3/uL   EOS (ABSOLUTE) 0.1 0.0 - 0.4 x10E3/uL   Basophils Absolute 0.1 0.0 - 0.2 x10E3/uL  Immature Granulocytes 0 Not Estab. %   Immature Grans (Abs) 0.0 0.0 - 0.1 x10E3/uL  CMP14+EGFR  Result Value Ref Range   Glucose 121 (H) 65 - 99 mg/dL   BUN 15 6 - 24 mg/dL   Creatinine, Ser 1.05 0.76 - 1.27 mg/dL   eGFR 85 >59 mL/min/1.73   BUN/Creatinine Ratio 14 9 - 20   Sodium 141 134 - 144 mmol/L   Potassium 4.1 3.5 - 5.2 mmol/L   Chloride 100 96 - 106 mmol/L   CO2 24 20 - 29 mmol/L   Calcium 9.9 8.7 - 10.2 mg/dL   Total Protein  7.7 6.0 - 8.5 g/dL   Albumin 4.4 3.8 - 4.9 g/dL   Globulin, Total 3.3 1.5 - 4.5 g/dL   Albumin/Globulin Ratio 1.3 1.2 - 2.2   Bilirubin Total 0.5 0.0 - 1.2 mg/dL   Alkaline Phosphatase 162 (H) 44 - 121 IU/L   AST 17 0 - 40 IU/L   ALT 20 0 - 44 IU/L       Pertinent labs & imaging results that were available during my care of the patient were reviewed by me and considered in my medical decision making.  Assessment & Plan:  Josiel was seen today for clostridium difficle.  Diagnoses and all orders for this visit:  Colitis due to Clostridium difficile Still having 5-8 stools per day. Has completed 10 days of vancomycin, will extend for 7 more days. Recheck in 2-4 weeks or sooner if needed. Pt aware of signs and symptoms which require emergent evaluation.  -     vancomycin (VANCOCIN) 125 MG capsule; Take 1 capsule (125 mg total) by mouth 4 (four) times daily for 7 days.    Continue all other maintenance medications.  Follow up plan: Return in about 4 weeks (around 09/07/2021), or if symptoms worsen or fail to improve.   Continue healthy lifestyle choices, including diet (rich in fruits, vegetables, and lean proteins, and low in salt and simple carbohydrates) and exercise (at least 30 minutes of moderate physical activity daily).  Educational handout given for C-Diff  The above assessment and management plan was discussed with the patient. The patient verbalized understanding of and has agreed to the management plan. Patient is aware to call the clinic if they develop any new symptoms or if symptoms persist or worsen. Patient is aware when to return to the clinic for a follow-up visit. Patient educated on when it is appropriate to go to the emergency department.   Monia Pouch, FNP-C [facility] [phone]

## 2021-08-11 ENCOUNTER — Ambulatory Visit: Payer: 59 | Admitting: Family Medicine

## 2021-08-19 ENCOUNTER — Other Ambulatory Visit: Payer: Self-pay | Admitting: Family Medicine

## 2021-08-19 DIAGNOSIS — I152 Hypertension secondary to endocrine disorders: Secondary | ICD-10-CM

## 2021-08-19 DIAGNOSIS — E1159 Type 2 diabetes mellitus with other circulatory complications: Secondary | ICD-10-CM

## 2021-09-07 ENCOUNTER — Ambulatory Visit (INDEPENDENT_AMBULATORY_CARE_PROVIDER_SITE_OTHER): Payer: 59

## 2021-09-07 ENCOUNTER — Ambulatory Visit: Payer: 59 | Admitting: Family Medicine

## 2021-09-07 ENCOUNTER — Encounter: Payer: Self-pay | Admitting: Family Medicine

## 2021-09-07 ENCOUNTER — Other Ambulatory Visit: Payer: Self-pay

## 2021-09-07 VITALS — BP 118/73 | HR 85 | Temp 98.5°F | Ht 73.0 in | Wt 215.0 lb

## 2021-09-07 DIAGNOSIS — R1084 Generalized abdominal pain: Secondary | ICD-10-CM | POA: Diagnosis not present

## 2021-09-07 DIAGNOSIS — K439 Ventral hernia without obstruction or gangrene: Secondary | ICD-10-CM

## 2021-09-07 NOTE — Progress Notes (Signed)
Subjective:  Patient ID: James [name], male    DOB: 04-Jul-1966, 55 y.o.   MRN: [id number]  Patient Care Team: Claretta Fraise, MD as PCP - General (Family Medicine)   Chief Complaint:  Medical Management of Chronic Issues (Work clearance)   HPI: James [name] is a 55 y.o. male presenting on 09/07/2021 for Medical Management of Chronic Issues (Work clearance)   Pt presents today to be cleared to return to work after recent surgery due to colitis and portal vein thrombosis. He is healing well but slower than he would like. He states he has noticed swelling in his abdomen with tenderness. States he had several normal bowel movements yesterday but states the area would get really hard prior to going to the restroom and was causing pain. No rectal bleeding, constipation, or diarrhea.    Relevant past medical, surgical, family, and social history reviewed and updated as indicated.  Allergies and medications reviewed and updated. Data reviewed: Chart in Epic.   Past Medical History:  Diagnosis Date   Hyperlipidemia    Hypertension     Past Surgical History:  Procedure Laterality Date   BACK SURGERY     TONSILLECTOMY      Social History   Socioeconomic History   Marital status: Married    Spouse name: Not on file   Number of children: Not on file   Years of education: Not on file   Highest education level: Not on file  Occupational History   Not on file  Tobacco Use   Smoking status: Never   Smokeless tobacco: Never  Vaping Use   Vaping Use: Never used  Substance and Sexual Activity   Alcohol use: No   Drug use: No   Sexual activity: Yes    Birth control/protection: None  Other Topics Concern   Not on file  Social History Narrative   Not on file   Social Determinants of Health   Financial Resource Strain: Not on file  Food Insecurity: Not on file  Transportation Needs: Not on file  Physical Activity: Not on file  Stress: Not on file  Social Connections:  Not on file  Intimate Partner Violence: Not on file    Outpatient Encounter Medications as of 09/07/2021  Medication Sig   acetaminophen (TYLENOL) 325 MG tablet Take by mouth.   atorvastatin (LIPITOR) 40 MG tablet TAKE ONE (1) TABLET EACH DAY   blood glucose meter kit and supplies Dispense based on patient and insurance preference. Use up to four times daily as directed. (FOR ICD-10 E10.9, E11.9).   diphenoxylate-atropine (LOMOTIL) 2.5-0.025 MG tablet Take by mouth 4 (four) times daily as needed for diarrhea or loose stools.   fenofibrate 160 MG tablet TAKE ONE (1) TABLET EACH DAY   fondaparinux (ARIXTRA) 7.5 MG/0.6ML SOLN injection SMARTSIG:0.6 Milliliter(s) SUB-Q Daily   levothyroxine (SYNTHROID) 50 MCG tablet TAKE ONE (1) TABLET EACH DAY   metoprolol succinate (TOPROL-XL) 100 MG 24 hr tablet TAKE 1 TABLET ONCE DAILY. TAKE WITH OR IMMEDIATELY FOLLOWING A MEAL   Omega-3 Fatty Acids (FISH OIL) 1000 MG CAPS Take 1,000 mg by mouth.   testosterone cypionate (DEPOTESTOSTERONE CYPIONATE) 200 MG/ML injection INJECT 0.5ML IM EVERY 7 DAYS   Turmeric 500 MG TABS Take by mouth.   valsartan-hydrochlorothiazide (DIOVAN-HCT) 320-25 MG tablet TAKE ONE TABLET BY MOUTH EACH DAY FOR BLOOD PRESSURE   [DISCONTINUED] promethazine (PHENERGAN) 25 MG tablet Take 25 mg by mouth every 6 (six) hours as needed for nausea or  vomiting.   Ascorbic Acid (VITAMIN C) 1000 MG tablet Take 1,000 mg by mouth daily. (Patient not taking: Reported on 09/07/2021)   cholecalciferol (VITAMIN D3) 25 MCG (1000 UNIT) tablet Take 1,000 Units by mouth daily. (Patient not taking: Reported on 09/07/2021)   dapagliflozin propanediol (FARXIGA) 10 MG TABS tablet Take 1 tablet (10 mg total) by mouth daily before breakfast. (Patient not taking: Reported on 09/07/2021)   fluticasone (FLONASE) 50 MCG/ACT nasal spray Place 1 spray into both nostrils 2 (two) times daily as needed for allergies or rhinitis. (Patient not taking: Reported on 09/07/2021)    therapeutic multivitamin-minerals (THERAGRAN-M) tablet Take by mouth. (Patient not taking: Reported on 09/07/2021)   vitamin B-12 (CYANOCOBALAMIN) 500 MCG tablet Take 500 mcg by mouth daily. (Patient not taking: Reported on 09/07/2021)   zinc gluconate 50 MG tablet Take 50 mg by mouth daily. (Patient not taking: Reported on 09/07/2021)   [DISCONTINUED] oxyCODONE (OXY IR/ROXICODONE) 5 MG immediate release tablet Take by mouth. (Patient not taking: Reported on 09/07/2021)   No facility-administered encounter medications on file as of 09/07/2021.    Allergies  Allergen Reactions   Metformin And Related     Loose bloody stools    Review of Systems  Constitutional:  Negative for activity change, appetite change, chills, diaphoresis, fatigue, fever and unexpected weight change.  HENT: Negative.    Eyes: Negative.   Respiratory:  Negative for cough, chest tightness and shortness of breath.   Cardiovascular:  Negative for chest pain, palpitations and leg swelling.  Gastrointestinal:  Positive for abdominal distention and abdominal pain. Negative for anal bleeding, blood in stool, constipation, diarrhea, nausea, rectal pain and vomiting.  Endocrine: Negative.   Genitourinary:  Negative for decreased urine volume, difficulty urinating, dysuria, frequency and urgency.  Musculoskeletal:  Negative for arthralgias and myalgias.  Skin: Negative.   Allergic/Immunologic: Negative.   Neurological:  Negative for dizziness, tremors, seizures, syncope, facial asymmetry, speech difficulty, weakness, light-headedness, numbness and headaches.  Hematological: Negative.   Psychiatric/Behavioral:  Negative for confusion, hallucinations, sleep disturbance and suicidal ideas.   All other systems reviewed and are negative.      Objective:  BP 118/73   Pulse 85   Temp 98.5 F (36.9 C)   Ht _0  (1.854 m)   Wt 215 lb (97.5 kg)   SpO2 100%   BMI 28.37 kg/m    Wt Readings from Last 3 Encounters:  09/07/21 215  lb (97.5 kg)  08/10/21 212 lb 6.4 oz (96.3 kg)  07/27/21 215 lb (97.5 kg)    Physical Exam Vitals and nursing note reviewed.  Constitutional:      General: He is not in acute distress.    Appearance: Normal appearance. He is well-developed, well-groomed and overweight. He is not ill-appearing, toxic-appearing or diaphoretic.  HENT:     Head: Normocephalic and atraumatic.     Jaw: There is normal jaw occlusion.     Right Ear: Hearing normal.     Left Ear: Hearing normal.     Nose: Nose normal.     Mouth/Throat:     Lips: Pink.     Mouth: Mucous membranes are moist.     Pharynx: Oropharynx is clear. Uvula midline.  Eyes:     General: Lids are normal.     Extraocular Movements: Extraocular movements intact.     Conjunctiva/sclera: Conjunctivae normal.     Pupils: Pupils are equal, round, and reactive to light.  Neck:     Thyroid: No thyroid mass,  thyromegaly or thyroid tenderness.     Vascular: No carotid bruit or JVD.     Trachea: Trachea and phonation normal.  Cardiovascular:     Rate and Rhythm: Normal rate and regular rhythm.     Chest Wall: PMI is not displaced.     Pulses: Normal pulses.     Heart sounds: Normal heart sounds. No murmur heard.   No friction rub. No gallop.  Pulmonary:     Effort: Pulmonary effort is normal. No respiratory distress.     Breath sounds: Normal breath sounds. No wheezing.  Abdominal:     General: Abdomen is protuberant. Bowel sounds are normal. There is distension. There is no abdominal bruit. There are no signs of injury.     Palpations: Abdomen is soft. There is no shifting dullness, fluid wave, hepatomegaly, splenomegaly, mass or pulsatile mass.     Tenderness: There is no abdominal tenderness. There is no right CVA tenderness, left CVA tenderness, guarding or rebound.     Hernia: A hernia is present.    Musculoskeletal:        General: Normal range of motion.     Cervical back: Normal range of motion and neck supple.     Right lower  leg: No edema.     Left lower leg: No edema.  Lymphadenopathy:     Cervical: No cervical adenopathy.  Skin:    General: Skin is warm and dry.     Capillary Refill: Capillary refill takes less than 2 seconds.     Coloration: Skin is not cyanotic, jaundiced or pale.     Findings: No rash.  Neurological:     General: No focal deficit present.     Mental Status: He is alert and oriented to person, place, and time.     Cranial Nerves: Cranial nerves are intact.     Sensory: Sensation is intact.     Motor: Motor function is intact.     Coordination: Coordination is intact.     Gait: Gait is intact.     Deep Tendon Reflexes: Reflexes are normal and symmetric.  Psychiatric:        Attention and Perception: Attention and perception normal.        Mood and Affect: Mood and affect normal.        Speech: Speech normal.        Behavior: Behavior normal. Behavior is cooperative.        Thought Content: Thought content normal.        Cognition and Memory: Cognition and memory normal.        Judgment: Judgment normal.    Results for orders placed or performed in visit on 07/27/21  Clostridium Difficile by PCR([facility])   Specimen: STOOL   ST  Result Value Ref Range   Toxigenic C. Difficile by PCR Positive (A) Negative  CBC with Differential/Platelet  Result Value Ref Range   WBC 7.7 3.4 - 10.8 x10E3/uL   RBC 4.83 4.14 - 5.80 x10E6/uL   Hemoglobin 13.2 13.0 - 17.7 g/dL   Hematocrit 42.8 37.5 - 51.0 %   MCV 89 79 - 97 fL   MCH 27.3 26.6 - 33.0 pg   MCHC 30.8 (L) 31.5 - 35.7 g/dL   RDW 14.2 11.6 - 15.4 %   Platelets 187 150 - 450 x10E3/uL   Neutrophils 68 Not Estab. %   Lymphs 20 Not Estab. %   Monocytes 10 Not Estab. %   Eos 1 Not Estab. %  Basos 1 Not Estab. %   Neutrophils Absolute 5.2 1.4 - 7.0 x10E3/uL   Lymphocytes Absolute 1.6 0.7 - 3.1 x10E3/uL   Monocytes Absolute 0.8 0.1 - 0.9 x10E3/uL   EOS (ABSOLUTE) 0.1 0.0 - 0.4 x10E3/uL   Basophils Absolute 0.1 0.0 - 0.2  x10E3/uL   Immature Granulocytes 0 Not Estab. %   Immature Grans (Abs) 0.0 0.0 - 0.1 x10E3/uL  CMP14+EGFR  Result Value Ref Range   Glucose 121 (H) 65 - 99 mg/dL   BUN 15 6 - 24 mg/dL   Creatinine, Ser 1.05 0.76 - 1.27 mg/dL   eGFR 85 >59 mL/min/1.73   BUN/Creatinine Ratio 14 9 - 20   Sodium 141 134 - 144 mmol/L   Potassium 4.1 3.5 - 5.2 mmol/L   Chloride 100 96 - 106 mmol/L   CO2 24 20 - 29 mmol/L   Calcium 9.9 8.7 - 10.2 mg/dL   Total Protein 7.7 6.0 - 8.5 g/dL   Albumin 4.4 3.8 - 4.9 g/dL   Globulin, Total 3.3 1.5 - 4.5 g/dL   Albumin/Globulin Ratio 1.3 1.2 - 2.2   Bilirubin Total 0.5 0.0 - 1.2 mg/dL   Alkaline Phosphatase 162 (H) 44 - 121 IU/L   AST 17 0 - 40 IU/L   ALT 20 0 - 44 IU/L     X-Ray: KUB: Stool and gas throughout, no noted obstruction. No acute findings. Preliminary x-ray reading by Monia Pouch, FNP-C, [facility].   Pertinent labs & imaging results that were available during my care of the patient were reviewed by me and considered in my medical decision making.  Assessment & Plan:  Kawon was seen today for medical management of chronic issues.  Diagnoses and all orders for this visit:  Generalized abdominal pain Abdominal wall hernia KUB without acute findings. Large abdominal wall hernia, easily reducible. Will place referral to general surgery for evaluation.  -     DG Abd 1 View -     Ambulatory referral to General Surgery   Continue all other maintenance medications.  Follow up plan: Return if symptoms worsen or fail to improve.   Continue healthy lifestyle choices, including diet (rich in fruits, vegetables, and lean proteins, and low in salt and simple carbohydrates) and exercise (at least 30 minutes of moderate physical activity daily).  Educational handout given for hernia  The above assessment and management plan was discussed with the patient. The patient verbalized understanding of and has agreed to the management plan. Patient is aware to  call the clinic if they develop any new symptoms or if symptoms persist or worsen. Patient is aware when to return to the clinic for a follow-up visit. Patient educated on when it is appropriate to go to the emergency department.   Monia Pouch, FNP-C [facility] [phone]  a

## 2021-09-09 ENCOUNTER — Other Ambulatory Visit: Payer: Self-pay | Admitting: Family Medicine

## 2021-09-09 DIAGNOSIS — E291 Testicular hypofunction: Secondary | ICD-10-CM

## 2021-09-09 NOTE — Telephone Encounter (Signed)
Last office visit 05/04/2021 Last A1C 05/04/2021 Last Testosterone lab 02/04/2021

## 2021-09-13 ENCOUNTER — Other Ambulatory Visit: Payer: Self-pay | Admitting: *Deleted

## 2021-09-13 DIAGNOSIS — R1084 Generalized abdominal pain: Secondary | ICD-10-CM

## 2021-09-13 DIAGNOSIS — A09 Infectious gastroenteritis and colitis, unspecified: Secondary | ICD-10-CM

## 2021-09-14 ENCOUNTER — Telehealth: Payer: Self-pay | Admitting: Family Medicine

## 2021-09-14 DIAGNOSIS — K439 Ventral hernia without obstruction or gangrene: Secondary | ICD-10-CM

## 2021-09-14 DIAGNOSIS — A09 Infectious gastroenteritis and colitis, unspecified: Secondary | ICD-10-CM

## 2021-09-14 DIAGNOSIS — R1084 Generalized abdominal pain: Secondary | ICD-10-CM

## 2021-09-14 NOTE — Telephone Encounter (Signed)
Pt called - he is worried about waiting until end of OCT for CT, and NOV for [facility] gen surgery.   Since he can not be seen by General at [facility], could he possibly be referred to General in [place] as urgent?

## 2021-09-14 NOTE — Telephone Encounter (Signed)
Referral placed - aware to listen out for call

## 2021-09-15 DIAGNOSIS — Z029 Encounter for administrative examinations, unspecified: Secondary | ICD-10-CM

## 2021-09-19 ENCOUNTER — Other Ambulatory Visit: Payer: Self-pay | Admitting: Family Medicine

## 2021-09-19 DIAGNOSIS — E039 Hypothyroidism, unspecified: Secondary | ICD-10-CM

## 2021-09-20 ENCOUNTER — Telehealth: Payer: Self-pay | Admitting: Family Medicine

## 2021-09-20 ENCOUNTER — Other Ambulatory Visit: Payer: Self-pay | Admitting: *Deleted

## 2021-09-20 MED ORDER — FONDAPARINUX SODIUM 7.5 MG/0.6ML ~~LOC~~ SOLN: SUBCUTANEOUS | 1 refills | Status: DC

## 2021-09-20 NOTE — Telephone Encounter (Signed)
  Prescription Request  09/20/2021  Is this a "Controlled Substance" medicine? no Have you seen your PCP in the last 2 weeks? yes If YES, route message to pool  -  If NO, patient needs to be scheduled for appointment.  What is the name of the medication or equipment? fondaparinux (ARIXTRA) 7.5 MG/0.6ML SOLN injection  Have you contacted your pharmacy to request a refill? yes  Which pharmacy would you like this sent to? [facility] --- was sent to the drug store in [place] first but needs to be [facility] to [facility]    Patient notified that their request is being sent to the clinical staff for review and that they should receive a response within 2 business days.

## 2021-09-21 MED ORDER — FONDAPARINUX SODIUM 7.5 MG/0.6ML ~~LOC~~ SOLN: SUBCUTANEOUS | 1 refills | Status: DC

## 2021-09-21 NOTE — Telephone Encounter (Signed)
Pt aware refill sent to correct pharmacy

## 2021-09-30 ENCOUNTER — Other Ambulatory Visit: Payer: Self-pay | Admitting: Nurse Practitioner

## 2021-10-03 ENCOUNTER — Telehealth: Payer: Self-pay | Admitting: Family Medicine

## 2021-10-03 MED ORDER — FONDAPARINUX SODIUM 7.5 MG/0.6ML ~~LOC~~ SOLN
7.5000 mg | Freq: Every day | SUBCUTANEOUS | 0 refills | Status: DC
Start: 2021-10-03 — End: 2021-10-23

## 2021-10-03 NOTE — Telephone Encounter (Signed)
If they call back see what kind of clarification needed.

## 2021-10-03 NOTE — Telephone Encounter (Signed)
Stayed on hold

## 2021-10-03 NOTE — Telephone Encounter (Signed)
Call from [facility] clarification 3 ml is only a 5 day supply Recalculated dose and resent for 18 ml for a 30 day supply

## 2021-10-03 NOTE — Addendum Note (Signed)
Addended by: Antonietta Barcelona D on: 10/03/2021 10:53 AM   Modules accepted: Orders

## 2021-10-04 ENCOUNTER — Encounter: Payer: Self-pay | Admitting: Family Medicine

## 2021-10-07 ENCOUNTER — Encounter (HOSPITAL_COMMUNITY): Payer: Self-pay

## 2021-10-07 ENCOUNTER — Ambulatory Visit (HOSPITAL_COMMUNITY)
Admission: RE | Admit: 2021-10-07 | Discharge: 2021-10-07 | Disposition: A | Discharge status: Home / Self Care | Payer: 59 | Source: Ambulatory Visit | Attending: Family Medicine | Admitting: Family Medicine

## 2021-10-07 ENCOUNTER — Other Ambulatory Visit: Payer: Self-pay

## 2021-10-07 DIAGNOSIS — A09 Infectious gastroenteritis and colitis, unspecified: Secondary | ICD-10-CM | POA: Diagnosis present

## 2021-10-07 DIAGNOSIS — R1084 Generalized abdominal pain: Secondary | ICD-10-CM | POA: Insufficient documentation

## 2021-10-07 LAB — POCT I-STAT CREATININE: Creatinine, Ser: 1.3 mg/dL — ABNORMAL HIGH (ref 0.61–1.24)

## 2021-10-07 MED ORDER — IOHEXOL 300 MG/ML  SOLN
100.0000 mL | Freq: Once | INTRAMUSCULAR | Status: AC | PRN
  Administered 2021-10-07: 100 mL via INTRAVENOUS

## 2021-10-10 ENCOUNTER — Ambulatory Visit: Payer: Self-pay | Admitting: Surgery

## 2021-10-10 ENCOUNTER — Ambulatory Visit: Payer: 59 | Admitting: Family Medicine

## 2021-10-10 ENCOUNTER — Other Ambulatory Visit: Payer: Self-pay | Admitting: Family Medicine

## 2021-10-10 ENCOUNTER — Other Ambulatory Visit: Payer: Self-pay

## 2021-10-10 ENCOUNTER — Encounter: Payer: Self-pay | Admitting: Family Medicine

## 2021-10-10 VITALS — BP 115/69 | HR 80 | Temp 98.0°F | Ht 73.0 in | Wt 223.0 lb

## 2021-10-10 DIAGNOSIS — I81 Portal vein thrombosis: Secondary | ICD-10-CM | POA: Diagnosis not present

## 2021-10-10 DIAGNOSIS — K432 Incisional hernia without obstruction or gangrene: Secondary | ICD-10-CM

## 2021-10-10 NOTE — Progress Notes (Signed)
Subjective:  Patient ID: James [name], male    DOB: 18-Dec-1966, 55 y.o.   MRN: [id number]  Patient Care Team: James Fraise, MD as PCP - General (Family Medicine) James Morgan, MD (Inactive) as Referring Physician (Hematology) James Keels, MD (Surgery) James Boston, MD as Consulting Physician (General Surgery)   Chief Complaint:  Follow-up (Hernia, imaging fllow up)   HPI: James [name] is a 55 y.o. male presenting on 10/10/2021 for Follow-up (Hernia, imaging fllow up)   Patient presents today for follow-up of periumbilical incisional hernia.  He has been evaluated by James [name] and is scheduled for hernia repair in the near future.  Surgical clearance has been provided to James [name].  He had a CT which revealed development of cavernous transformation of the main portal vein secondary to thrombosis noted on prior CT scan, embolization coils noted within right hepatic lobe which was not identified on prior exam, large amount of stool seen in the sigmoid colon and rectum, sigmoid diverticulosis is noted without definite evidence of acute inflammation and mild [name] necked fat containing periumbilical hernia is noted. He is currently on Arixtra injections and is followed by James [name], pt has upcoming visit. He reports having three large bowel movements since the CT scan. He has been wearing an abdominal binder for support and comfort measures, states this is slightly helpful. He is just ready to get hernia repaired and back to work.    Relevant past medical, surgical, family, and social history reviewed and updated as indicated.  Allergies and medications reviewed and updated. Data reviewed: Chart in Epic.   Past Medical History:  Diagnosis Date  . Hyperlipidemia   . Hypertension     Past Surgical History:  Procedure Laterality Date  . BACK SURGERY    . TONSILLECTOMY      Social History   Socioeconomic History  . Marital status: Married    Spouse name: Not on file  .  Number of children: Not on file  . Years of education: Not on file  . Highest education level: Not on file  Occupational History  . Not on file  Tobacco Use  . Smoking status: Never  . Smokeless tobacco: Never  Vaping Use  . Vaping Use: Never used  Substance and Sexual Activity  . Alcohol use: No  . Drug use: No  . Sexual activity: Yes    Birth control/protection: None  Other Topics Concern  . Not on file  Social History Narrative  . Not on file   Social Determinants of Health   Financial Resource Strain: Not on file  Food Insecurity: Not on file  Transportation Needs: Not on file  Physical Activity: Not on file  Stress: Not on file  Social Connections: Not on file  Intimate Partner Violence: Not on file    Outpatient Encounter Medications as of 10/10/2021  Medication Sig  . acetaminophen (TYLENOL) 325 MG tablet Take by mouth.  Marland Kitchen atorvastatin (LIPITOR) 40 MG tablet TAKE ONE (1) TABLET EACH DAY  . blood glucose meter kit and supplies Dispense based on patient and insurance preference. Use up to four times daily as directed. (FOR ICD-10 E10.9, E11.9).  Marland Kitchen diphenoxylate-atropine (LOMOTIL) 2.5-0.025 MG tablet Take by mouth 4 (four) times daily as needed for diarrhea or loose stools.  . fenofibrate 160 MG tablet TAKE ONE (1) TABLET EACH DAY  . fondaparinux (ARIXTRA) 7.5 MG/0.6ML SOLN injection Inject 0.6 mLs (7.5 mg total) into the skin daily.  Marland Kitchen levothyroxine (  SYNTHROID) 50 MCG tablet TAKE ONE (1) TABLET EACH DAY  . metoprolol succinate (TOPROL-XL) 100 MG 24 hr tablet TAKE 1 TABLET ONCE DAILY. TAKE WITH OR IMMEDIATELY FOLLOWING A MEAL  . Omega-3 Fatty Acids (FISH OIL) 1000 MG CAPS Take 1,000 mg by mouth.  . testosterone cypionate (DEPOTESTOSTERONE CYPIONATE) 200 MG/ML injection INJECT 0.5ML IM EVERY 7 DAYS  . therapeutic multivitamin-minerals (THERAGRAN-M) tablet Take by mouth.  . Turmeric 500 MG TABS Take by mouth.  . valsartan-hydrochlorothiazide (DIOVAN-HCT) 320-25 MG  tablet TAKE ONE TABLET BY MOUTH EACH DAY FOR BLOOD PRESSURE  . vitamin B-12 (CYANOCOBALAMIN) 500 MCG tablet Take 500 mcg by mouth daily.  Marland Kitchen zinc gluconate 50 MG tablet Take 50 mg by mouth daily.   No facility-administered encounter medications on file as of 10/10/2021.    Allergies  Allergen Reactions  . Metformin And Related     Loose bloody stools    Review of Systems  Constitutional:  Negative for activity change, appetite change, chills, diaphoresis, fatigue, fever and unexpected weight change.  HENT: Negative.    Eyes: Negative.   Respiratory:  Negative for cough, chest tightness and shortness of breath.   Cardiovascular:  Negative for chest pain, palpitations and leg swelling.  Gastrointestinal:  Positive for abdominal distention and abdominal pain. Negative for anal bleeding, blood in stool, constipation, diarrhea, nausea, rectal pain and vomiting.  Endocrine: Negative.   Genitourinary:  Negative for decreased urine volume, difficulty urinating, dysuria, frequency and urgency.  Musculoskeletal:  Negative for arthralgias and myalgias.  Skin: Negative.   Allergic/Immunologic: Negative.   Neurological:  Negative for dizziness, tremors, seizures, syncope, facial asymmetry, speech difficulty, weakness, light-headedness, numbness and headaches.  Hematological: Negative.  Does not bruise/bleed easily.  Psychiatric/Behavioral:  Negative for confusion, hallucinations, sleep disturbance and suicidal ideas.   All other systems reviewed and are negative.      Objective:  BP 115/69   Pulse 80   Temp 98 F (36.7 C)   Ht _0  (1.854 m)   Wt 223 lb (101.2 kg)   SpO2 100%   BMI 29.42 kg/m    Wt Readings from Last 3 Encounters:  10/10/21 223 lb (101.2 kg)  09/07/21 215 lb (97.5 kg)  08/10/21 212 lb 6.4 oz (96.3 kg)    Physical Exam Vitals and nursing note reviewed.  Constitutional:      General: He is not in acute distress.    Appearance: Normal appearance. He is  well-developed, well-groomed and overweight. He is not ill-appearing, toxic-appearing or diaphoretic.  HENT:     Head: Normocephalic and atraumatic.     Jaw: There is normal jaw occlusion.     Right Ear: Hearing normal.     Left Ear: Hearing normal.     Nose: Nose normal.     Mouth/Throat:     Lips: Pink.     Mouth: Mucous membranes are moist.     Pharynx: Oropharynx is clear. Uvula midline.  Eyes:     General: Lids are normal.     Extraocular Movements: Extraocular movements intact.     Conjunctiva/sclera: Conjunctivae normal.     Pupils: Pupils are equal, round, and reactive to light.  Neck:     Thyroid: No thyroid mass, thyromegaly or thyroid tenderness.     Vascular: No carotid bruit or JVD.     Trachea: Trachea and phonation normal.  Cardiovascular:     Rate and Rhythm: Normal rate and regular rhythm.     Chest Wall: PMI is not  displaced.     Pulses: Normal pulses.     Heart sounds: Normal heart sounds. No murmur heard.   No friction rub. No gallop.  Pulmonary:     Effort: Pulmonary effort is normal. No respiratory distress.     Breath sounds: Normal breath sounds. No wheezing.  Abdominal:     General: Bowel sounds are normal. There is no distension or abdominal bruit.     Palpations: Abdomen is soft. There is no hepatomegaly or splenomegaly.     Tenderness: There is no abdominal tenderness. There is no right CVA tenderness or left CVA tenderness.     Hernia: A hernia is present. Hernia is present in the umbilical area (periumbilical, incisional, large 20 cm x 7 cm. Soft and easily reducible.).  Musculoskeletal:        General: Normal range of motion.     Cervical back: Normal range of motion and neck supple.     Right lower leg: No edema.     Left lower leg: No edema.  Lymphadenopathy:     Cervical: No cervical adenopathy.  Skin:    General: Skin is warm and dry.     Capillary Refill: Capillary refill takes less than 2 seconds.     Coloration: Skin is not cyanotic,  jaundiced or pale.     Findings: No rash.  Neurological:     General: No focal deficit present.     Mental Status: He is alert and oriented to person, place, and time.     Sensory: Sensation is intact.     Motor: Motor function is intact. No weakness.     Coordination: Coordination is intact.     Gait: Gait is intact. Gait normal.     Deep Tendon Reflexes: Reflexes are normal and symmetric.  Psychiatric:        Attention and Perception: Attention and perception normal.        Mood and Affect: Mood and affect normal.        Speech: Speech normal.        Behavior: Behavior normal. Behavior is cooperative.        Thought Content: Thought content normal.        Cognition and Memory: Cognition and memory normal.        Judgment: Judgment normal.    Results for orders placed or performed during the hospital encounter of 10/07/21  I-STAT creatinine  Result Value Ref Range   Creatinine, Ser 1.30 (H) 0.61 - 1.24 mg/dL       Pertinent labs & imaging results that were available during my care of the patient were reviewed by me and considered in my medical decision making.  Assessment & Plan:  James [name] was seen today for follow-up.  Diagnoses and all orders for this visit:  Incisional hernia, without obstruction or gangrene Scheduled for repair with James [name].   Portal vein thrombosis Followed by James [name], aware to follow up as scheduled.     Continue all other maintenance medications.  Follow up plan: Return if symptoms worsen or fail to improve.   Continue healthy lifestyle choices, including diet (rich in fruits, vegetables, and lean proteins, and low in salt and simple carbohydrates) and exercise (at least 30 minutes of moderate physical activity daily).  Educational handout given for hernia  The above assessment and management plan was discussed with the patient. The patient verbalized understanding of and has agreed to the management plan. Patient is aware to call the  clinic if  they develop any new symptoms or if symptoms persist or worsen. Patient is aware when to return to the clinic for a follow-up visit. Patient educated on when it is appropriate to go to the emergency department.   Monia Pouch, FNP-C [facility] [phone]

## 2021-10-10 NOTE — Telephone Encounter (Signed)
I spoke to patient.  He states this has ben resolved

## 2021-10-21 ENCOUNTER — Other Ambulatory Visit: Payer: Self-pay | Admitting: Family Medicine

## 2021-10-28 NOTE — Patient Instructions (Signed)
DUE TO COVID-19 ONLY ONE VISITOR IS ALLOWED TO COME WITH YOU AND STAY IN THE WAITING ROOM ONLY DURING PRE OP AND PROCEDURE.   **NO VISITORS ARE ALLOWED IN THE SHORT STAY AREA OR RECOVERY ROOM!!**  IF YOU WILL BE ADMITTED INTO THE HOSPITAL YOU ARE ALLOWED ONLY TWO SUPPORT PEOPLE DURING VISITATION HOURS ONLY (7 AM -8PM)   The support person(s) must pass our screening, gel in and out, and wear a mask at all times, including in the patient's room. Patients must also wear a mask when staff or their support person are in the room. Visitors GUEST BADGE MUST BE WORN VISIBLY  One adult visitor may remain with you overnight and MUST be in the room by 8 P.M.  No visitors under the age of 32. Any visitor under the age of 54 must be accompanied by an adult.    COVID SWAB TESTING MUST BE COMPLETED ON: 11/21/21  **MUST PRESENT COMPLETED FORM AT TESTING SITE**    [facility] [place] (backside of the building) You are not required to quarantine, however you are required to wear a well-fitted mask when you are out and around people not in your household.  Hand Hygiene often Do NOT share personal items Notify your provider if you are in close contact with someone who has COVID or you develop fever 100.4 or greater, new onset of sneezing, cough, sore throat, shortness of breath or body aches.  [address], [address], must go inside of the hospital, NOT A DRIVE THRU!  (Must self quarantine after testing. Follow instructions on handout.)       Your procedure is scheduled on: 11/23/21   Report to [facility] Main Entrance    Report to admitting at: 6:15 AM   Call this number if you have problems the morning of surgery [phone]   Do not eat food :After Midnight.   DRINK 2 PRESURGERY ENSURE DRINKS THE NIGHT BEFORE SURGERY AT  1000 PM AND 1 PRESURGERY DRINK THE DAY OF THE PROCEDURE 3 HOURS PRIOR TO SCHEDULED SURGERY. NO  SOLIDS AFTER MIDNIGHT THE DAY PRIOR TO THE SURGERY. NOTHING BY MOUTH EXCEPT CLEAR LIQUIDS UNTIL THREE HOURS PRIOR TO SCHEDULED SURGERY. PLEASE FINISH PRESURGERY ENSURE DRINK PER SURGEON ORDER 3 HOURS PRIOR TO SCHEDULED SURGERY TIME WHICH NEEDS TO BE COMPLETED AT : 5:30 AM.   CLEAR LIQUID DIET  Foods Allowed                                                                     Foods Excluded  Water, Black Coffee and tea, regular and decaf                             liquids that you cannot  Plain Jell-O in any flavor  (No red)                                           see through such as: Fruit ices (not with fruit pulp)  milk, soups, orange juice              Iced Popsicles (No red)                                    All solid food                                   Apple juices Sports drinks like Gatorade (No red) Lightly seasoned clear broth or consume(fat free) Sugar  Sample Menu Breakfast                                Lunch                                     Supper Cranberry juice                    Beef broth                            Chicken broth Jell-O                                     Grape juice                           Apple juice Coffee or tea                        Jell-O                                      Popsicle                                                Coffee or tea                        Coffee or tea     Drink 2 Ensure drinks the night before surgery.  Complete one Ensure drink the morning of surgery 3 hours prior to scheduled surgery.    The day of surgery:  Drink ONE (1) Pre-Surgery Clear Ensure or G2 by am the morning of surgery. Drink in one sitting. Do not sip.  This drink was given to you during your hospital  pre-op appointment visit. Nothing else to drink after completing the  Pre-Surgery Clear Ensure or G2.          If you have questions, please contact your surgeon's office.     Oral Hygiene is also important  to reduce your risk of infection.                                    Remember - BRUSH YOUR TEETH THE  MORNING OF SURGERY WITH YOUR REGULAR TOOTHPASTE   Do NOT smoke after Midnight   Take these medicines the morning of surgery with A SIP OF WATER: metoprolol,synthroid                              You may not have any metal on your body including hair pins, jewelry, and body piercing             Do not wear lotions, powders, perfumes/cologne, or deodorant              Men may shave face and neck.   Do not bring valuables to the hospital. [place].   Contacts, dentures or bridgework may not be worn into surgery.   Bring small overnight bag day of surgery.    Patients discharged on the day of surgery will not be allowed to drive home.   Special Instructions: Bring a copy of your healthcare power of attorney and living will documents         the day of surgery if you haven't scanned them before.              Please read over the following fact sheets you were given: IF YOU HAVE QUESTIONS ABOUT YOUR PRE-OP INSTRUCTIONS PLEASE CALL [phone]     [facility] - Preparing for Surgery Before surgery, you can play an important role.  Because skin is not sterile, your skin needs to be as free of germs as possible.  You can reduce the number of germs on your skin by washing with CHG (chlorahexidine gluconate) soap before surgery.  CHG is an antiseptic cleaner which kills germs and bonds with the skin to continue killing germs even after washing. Please DO NOT use if you have an allergy to CHG or antibacterial soaps.  If your skin becomes reddened/irritated stop using the CHG and inform your nurse when you arrive at Short Stay. Do not shave (including legs and underarms) for at least 48 hours prior to the first CHG shower.  You may shave your face/neck. Please follow these instructions carefully:  1.  Shower with CHG Soap the night before surgery  and the  morning of Surgery.  2.  If you choose to wash your hair, wash your hair first as usual with your  normal  shampoo.  3.  After you shampoo, rinse your hair and body thoroughly to remove the  shampoo.                           4.  Use CHG as you would any other liquid soap.  You can apply chg directly  to the skin and wash                       Gently with a scrungie or clean washcloth.  5.  Apply the CHG Soap to your body ONLY FROM THE NECK DOWN.   Do not use on face/ open                           Wound or open sores. Avoid contact with eyes, ears mouth and genitals (private parts).  Wash face,  Genitals (private parts) with your normal soap.             6.  Wash thoroughly, paying special attention to the area where your surgery  will be performed.  7.  Thoroughly rinse your body with warm water from the neck down.  8.  DO NOT shower/wash with your normal soap after using and rinsing off  the CHG Soap.                9.  Pat yourself dry with a clean towel.            10.  Wear clean pajamas.            11.  Place clean sheets on your bed the night of your first shower and do not  sleep with pets. Day of Surgery : Do not apply any lotions/deodorants the morning of surgery.  Please wear clean clothes to the hospital/surgery center.  FAILURE TO FOLLOW THESE INSTRUCTIONS MAY RESULT IN THE CANCELLATION OF YOUR SURGERY PATIENT SIGNATURE_________________________________  NURSE SIGNATURE__________________________________  ________________________________________________________________________

## 2021-10-31 ENCOUNTER — Other Ambulatory Visit: Payer: Self-pay | Admitting: Family Medicine

## 2021-10-31 DIAGNOSIS — E785 Hyperlipidemia, unspecified: Secondary | ICD-10-CM

## 2021-10-31 DIAGNOSIS — E1169 Type 2 diabetes mellitus with other specified complication: Secondary | ICD-10-CM

## 2021-11-01 ENCOUNTER — Encounter (HOSPITAL_COMMUNITY): Payer: Self-pay

## 2021-11-01 ENCOUNTER — Encounter (HOSPITAL_COMMUNITY)
Admission: RE | Admit: 2021-11-01 | Discharge: 2021-11-01 | Disposition: A | Discharge status: Home / Self Care | Payer: 59 | Source: Ambulatory Visit | Attending: Surgery | Admitting: Surgery

## 2021-11-01 ENCOUNTER — Other Ambulatory Visit: Payer: Self-pay

## 2021-11-01 VITALS — BP 146/82 | HR 78 | Temp 98.0°F | Resp 18 | Ht 73.0 in | Wt 229.0 lb

## 2021-11-01 DIAGNOSIS — Z01818 Encounter for other preprocedural examination: Secondary | ICD-10-CM | POA: Diagnosis present

## 2021-11-01 DIAGNOSIS — E119 Type 2 diabetes mellitus without complications: Secondary | ICD-10-CM

## 2021-11-01 DIAGNOSIS — E1159 Type 2 diabetes mellitus with other circulatory complications: Secondary | ICD-10-CM | POA: Insufficient documentation

## 2021-11-01 DIAGNOSIS — I152 Hypertension secondary to endocrine disorders: Secondary | ICD-10-CM

## 2021-11-01 DIAGNOSIS — I10 Essential (primary) hypertension: Secondary | ICD-10-CM | POA: Diagnosis not present

## 2021-11-01 HISTORY — DX: Hypothyroidism, unspecified: E03.9

## 2021-11-01 HISTORY — DX: Unspecified osteoarthritis, unspecified site: M19.90

## 2021-11-01 HISTORY — DX: Other specified postprocedural states: Z98.890

## 2021-11-01 HISTORY — DX: Sleep apnea, unspecified: G47.30

## 2021-11-01 HISTORY — DX: Prediabetes: R73.03

## 2021-11-01 LAB — CBC
HCT: 43.7 % (ref 39.0–52.0)
Hemoglobin: 13.1 g/dL (ref 13.0–17.0)
MCH: 24.3 pg — ABNORMAL LOW (ref 26.0–34.0)
MCHC: 30 g/dL (ref 30.0–36.0)
MCV: 80.9 fL (ref 80.0–100.0)
Platelets: 143 10*3/uL — ABNORMAL LOW (ref 150–400)
RBC: 5.4 MIL/uL (ref 4.22–5.81)
RDW: 16.7 % — ABNORMAL HIGH (ref 11.5–15.5)
WBC: 7.6 10*3/uL (ref 4.0–10.5)
nRBC: 0 % (ref 0.0–0.2)

## 2021-11-01 LAB — HEMOGLOBIN A1C
Hgb A1c MFr Bld: 5.7 % — ABNORMAL HIGH (ref 4.8–5.6)
Mean Plasma Glucose: 116.89 mg/dL

## 2021-11-01 LAB — BASIC METABOLIC PANEL
Anion gap: 9 (ref 5–15)
BUN: 13 mg/dL (ref 6–20)
CO2: 27 mmol/L (ref 22–32)
Calcium: 9.7 mg/dL (ref 8.9–10.3)
Chloride: 99 mmol/L (ref 98–111)
Creatinine, Ser: 1.25 mg/dL — ABNORMAL HIGH (ref 0.61–1.24)
GFR, Estimated: >60 mL/min (ref >60)
Glucose, Bld: 91 mg/dL (ref 70–99)
Potassium: 3.6 mmol/L (ref 3.5–5.1)
Sodium: 135 mmol/L (ref 135–145)

## 2021-11-01 LAB — GLUCOSE, CAPILLARY: Glucose-Capillary: 88 mg/dL (ref 70–99)

## 2021-11-01 NOTE — Progress Notes (Signed)
COVID Vaccine Completed: NO Date COVID Vaccine completed: COVID vaccine manufacturer: [employer] Test: 11/21/21  PCP - Dr. Claretta Fraise Cardiologist - N/A  Chest x-ray - 06/15/21 [facility] EKG -05/30/21 [facility]  Stress Test -  ECHO -  Cardiac Cath -  Pacemaker/ICD device last checked:  Sleep Study - Yes CPAP - Yes  Fasting Blood Sugar - 90's Checks Blood Sugar ___2__ times a day  Blood Thinner Instructions:Arixta will be held 3 days before surgery as per Dr. Johney Maine instructions.  Aspirin Instructions: Last Dose:  Anesthesia review: Hx: HTN,OSA(CPAP),Pre-DIA.  Patient denies shortness of breath, fever, cough and chest pain at PAT appointment   Patient verbalized understanding of instructions that were given to them at the PAT appointment. Patient was also instructed that they will need to review over the PAT instructions again at home before surgery.

## 2021-11-02 ENCOUNTER — Ambulatory Visit (INDEPENDENT_AMBULATORY_CARE_PROVIDER_SITE_OTHER): Payer: 59 | Admitting: Gastroenterology

## 2021-11-02 ENCOUNTER — Other Ambulatory Visit (INDEPENDENT_AMBULATORY_CARE_PROVIDER_SITE_OTHER): Payer: Self-pay

## 2021-11-02 ENCOUNTER — Encounter (INDEPENDENT_AMBULATORY_CARE_PROVIDER_SITE_OTHER): Payer: Self-pay

## 2021-11-02 ENCOUNTER — Telehealth (INDEPENDENT_AMBULATORY_CARE_PROVIDER_SITE_OTHER): Payer: Self-pay

## 2021-11-02 ENCOUNTER — Encounter (INDEPENDENT_AMBULATORY_CARE_PROVIDER_SITE_OTHER): Payer: Self-pay | Admitting: Gastroenterology

## 2021-11-02 DIAGNOSIS — I81 Portal vein thrombosis: Secondary | ICD-10-CM | POA: Diagnosis not present

## 2021-11-02 DIAGNOSIS — Z8719 Personal history of other diseases of the digestive system: Secondary | ICD-10-CM | POA: Diagnosis not present

## 2021-11-02 DIAGNOSIS — I152 Hypertension secondary to endocrine disorders: Secondary | ICD-10-CM

## 2021-11-02 DIAGNOSIS — K55069 Acute infarction of intestine, part and extent unspecified: Secondary | ICD-10-CM

## 2021-11-02 DIAGNOSIS — K432 Incisional hernia without obstruction or gangrene: Secondary | ICD-10-CM | POA: Insufficient documentation

## 2021-11-02 DIAGNOSIS — E1159 Type 2 diabetes mellitus with other circulatory complications: Secondary | ICD-10-CM

## 2021-11-02 NOTE — H&P (View-Only) (Signed)
Maylon Peppers, M.D. Gastroenterology & Hepatology [facility] [address] [place], [place] [address] Primary Care Physician: Claretta Fraise, MD [place] [address]  Referring MD: PCP and Dr. [name], MD  Chief Complaint:  history of diverticulitis, portal vein with thrombosis,SMV, jugular veins, splenic vein, and intrahepatic veins thrombosis  History of Present Illness: James [name] is a 55 y.o. 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 diff, who presents for evaluation of history of diverticulitis, portal vein with thrombosis,SMV, jugular veins, splenic vein, and intrahepatic veins thrombosis.  The patient presented episodes of abdominal pain since June 2022 which were considered initially to be secondary to episodes of diverticulitis given findings on CT of the abdomen and pelvis without IV contrast.  These were initially treated with antibiotics x2 but he underwent a subsequent CT of the abdomen and pelvis with IV contrast on 06/13/2021 due to persistent pain.  This imaging study showed changes concerning for bowel ischemia and mesenteric thrombosis involving the superior mesenteric vein, peripheral jejunal veins, central splenic vein, main portal vein, right and left intrahepatic portal veins.  Due to this he was transferred to [facility] and underwent emergent removal of 55 cm of ischemic small bowel.  His clinical course during his hospitalization was complicated by multiple thrombectomies performed by interventional radiology which were performed as he developed multiple thrombosis in the SMV, jugular veins, splenic vein, portal vein and intrahepatic veins (thrombectomy only performed from portal,  splenic and superior mesenteric veins ).  He was subsequently reanastomosed and the patient was discharged from the hospital successfully.  The patient was seen by Dr. Johney Maine in August 2022 as he developed an incisional hernia.  He was referred to our clinic to undergo a colonoscopy prior to undergoing hernia repair, as he had a history of diverticulitis in the past.  Last evaluation by hematology (Dr. Odis Hollingshead) was held on 10/25/2021.  Patient discussed with the hematology the possibility of switching to Eliquis or Xarelto but decision was not made yet as the patient was looking at the specific cost of the medication.Patient reports that he is supposed to receive his new DOAC on 11/11/2021 and will stop taking the Arixtra at that time.  He was also recommended to undergo an EGD to evaluate for esophageal varices given the presence of the cavernomatous degeneration of the portal vein.  Finally, Dr. Joan Flores cleared him to undergo his ventral hernia surgery and gave recommendations for his anticoagulation as follow:  "Will Skip 2 Days of Eliquis or Xarelto prior to the Surgery. He will restart Eliquis or Xarelto 1 Day after the Surgery at prophylactic dose, i.e.. 2.5 mg Twice Daily for Eliquis or Xarelto 10 mg Once Daily for Xarelto, until the Surgeon Is Comfortable from a Bleeding Point of View to have him increase the dose to the full maintenance dose of Eliquis 5 mg twice daily or Xarelto 20 mg once daily. It is fine to go ahead with thr hernia surgery on December 7-froma hematology point of view he is cleared."  Patient reports that he has some mild pain at the hernia site. Has not had any recent melena or hematochezia. States he has a long standing history of intermittent diarrhea. Stools can be loose but it can get softer occasionally. He did not  notice any difference in his bowel movement frequency after his surgery.  The patient denies having any nausea, vomiting, fever, chills, hematochezia, melena,  hematemesis, abdominal distention, jaundice, pruritus or weight loss.  Last EGD:multiple years ago , possibly 8 years, normal per patient Last Colonoscopy: 4 years ago, normal per patient performed at [facility], no report available.  FHx: neg for any gastrointestinal/liver disease, kidney cancer cousin Social: neg smoking, alcohol or illicit drug use  Past Medical History: Past Medical History:  Diagnosis Date   Arthritis    Hx of exploratory laparotomy    enterectomy small intest. resection,celiotomy   Hyperlipidemia    Hypertension    Hypothyroidism    Pre-diabetes    Sleep apnea     Past Surgical History: Past Surgical History:  Procedure Laterality Date   BACK SURGERY     TONSILLECTOMY      Family History: Family History  Problem Relation Age of Onset   COPD Mother    Alzheimer's disease Father     Social History: Social History   Tobacco Use  Smoking Status Never  Smokeless Tobacco Never   Social History   Substance and Sexual Activity  Alcohol Use No   Social History   Substance and Sexual Activity  Drug Use No    Allergies: Allergies  Allergen Reactions   Metformin And Related     Loose bloody stools    Medications: Current Outpatient Medications  Medication Sig Dispense Refill   atorvastatin (LIPITOR) 40 MG tablet TAKE ONE (1) TABLET EACH DAY 90 tablet 0   blood glucose meter kit and supplies Dispense based on patient and insurance preference. Use up to four times daily as directed. (FOR ICD-10 E10.9, E11.9). 1 each 0   fenofibrate 160 MG tablet TAKE ONE (1) TABLET EACH DAY 90 tablet 0   fondaparinux (ARIXTRA) 7.5 MG/0.6ML SOLN injection INJECT THE CONTENTS OF 1 SYRINGE (7.5 MG) UNDER THE SKIN DAILY 18 mL 0   levothyroxine (SYNTHROID) 50 MCG tablet TAKE ONE (1) TABLET EACH DAY 90 tablet 0   metoprolol succinate (TOPROL-XL) 100 MG 24 hr tablet TAKE 1 TABLET ONCE DAILY. TAKE WITH OR IMMEDIATELY FOLLOWING A MEAL 90 tablet 1   testosterone cypionate  (DEPOTESTOSTERONE CYPIONATE) 200 MG/ML injection INJECT 0.5ML IM EVERY 7 DAYS 2 mL 0   valsartan-hydrochlorothiazide (DIOVAN-HCT) 320-25 MG tablet TAKE ONE TABLET BY MOUTH EACH DAY FOR BLOOD PRESSURE 30 tablet 0   acetaminophen (TYLENOL) 325 MG tablet Take 650 mg by mouth every 6 (six) hours as needed for moderate pain.     No current facility-administered medications for this visit.    Review of Systems: GENERAL: negative for malaise, night sweats HEENT: No changes in hearing or vision, no nose bleeds or other nasal problems. NECK: Negative for lumps, goiter, pain and significant neck swelling RESPIRATORY: Negative for cough, wheezing CARDIOVASCULAR: Negative for chest pain, leg swelling, palpitations, orthopnea GI: SEE HPI MUSCULOSKELETAL: Negative for joint pain or swelling, back pain, and muscle pain. SKIN: Negative for lesions, rash PSYCH: Negative for sleep disturbance, mood disorder and recent psychosocial stressors. HEMATOLOGY Negative for prolonged bleeding, bruising easily, and swollen nodes. ENDOCRINE: Negative for cold or heat intolerance, polyuria, polydipsia and goiter. NEURO: negative for tremor, gait imbalance, syncope and seizures. The remainder of the review of systems is noncontributory.   Physical Exam: BP 114/68 (BP Location: Left Arm, Patient Position: Sitting, Cuff Size: Large)   Pulse 94   Temp 98.1 F (36.7 C) (Oral)   Ht 6' 1" (  1.854 m)   Wt 233 lb 4.8 oz (105.8 kg)   BMI 30.78 kg/m  GENERAL: The patient is AO x3, in no acute distress. HEENT: Head is normocephalic and atraumatic. EOMI are intact. Mouth is well hydrated and without lesions. NECK: Supple. No masses LUNGS: Clear to auscultation. No presence of rhonchi/wheezing/rales. Adequate chest expansion HEART: RRR, normal s1 and s2. ABDOMEN: Soft, nontender, no guarding, no peritoneal signs, and nondistended. BS +. Has presence of a large ventral hernia. Has a midline scar. EXTREMITIES: Without any  cyanosis, clubbing, rash, lesions or edema. NEUROLOGIC: AOx3, no focal motor deficit. SKIN: no jaundice, no rashes   Imaging/Labs: as above  I personally reviewed and interpreted the available labs, imaging and endoscopic files.  Impression and Plan: Traeger W Dahlem is a 55 y.o. 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 diff, who presents for evaluation of history of diverticulitis, portal vein with thrombosis,SMV, jugular veins, splenic vein, and intrahepatic veins thrombosis.  The patient had a very complex clinical course since June 2022 where he was found to have venous thrombosis of several segments of his splanchnic and hepatic vasculature.  He developed bowel necrosis due to this and require to undergo resection of 55 cm of the small bowel.  Has not presented any postsurgical complications or diarrhea due to this which is reassuring.  However, he developed an incisional hernia which will be surgical managed in December 7.  He is surgeon requested to proceed with a colonoscopy given his history of diverticulitis which I consider adequate.  We discussed with the patient thoroughly the procedure and he agreed to proceed with this.  On the other hand, his hematologist requested an EGD to evaluate for esophageal varices given the presence of changes in his hepatic vasculature.  I consider this is adequate and we will proceed with both EGD and colonoscopy at the same time, following the anticoagulation recommendations given by his hematologist.  I also explained to him that he will need to continue taking his anticoagulation lifelong as he has a high risk of having thrombosis and there was no clear trigger for his initial thrombosis episode.  - Schedule EGD and colonoscopy - If starting  the oral anticoagulant at least a week before procedure, skip 2 Days of Eliquis or Xarelto prior to the procedures. - If still only on the Arixtra in the week before procedures, patient will need to call to our office for further redirections  All questions were answered.      Total time spent in face-to-face encounter and reviewing medical records: 50 minutes  Felisha Claytor Castaneda, MD Gastroenterology and Hepatology [facility]  

## 2021-11-02 NOTE — Patient Instructions (Addendum)
Schedule EGD and colonoscopy If starting the oral anticoagulant at least a week before your procedure, please follow the following: - Skip 2 Days of Eliquis or Xarelto prior to the procedures. If still only on the Arixtra in the week before your procedure please call to our office

## 2021-11-02 NOTE — Progress Notes (Signed)
James [name], M.D. Gastroenterology & Hepatology [facility] [address] [place], [place] [address] Primary Care Physician: James Fraise, MD [place] [address]  Referring MD: PCP and Dr. [name], MD  Chief Complaint:  history of diverticulitis, portal vein with thrombosis,SMV, jugular veins, splenic vein, and intrahepatic veins thrombosis  History of Present Illness: James [name] is a 55 y.o. 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 diff, who presents for evaluation of history of diverticulitis, portal vein with thrombosis,SMV, jugular veins, splenic vein, and intrahepatic veins thrombosis.  The patient presented episodes of abdominal pain since June 2022 which were considered initially to be secondary to episodes of diverticulitis given findings on CT of the abdomen and pelvis without IV contrast.  These were initially treated with antibiotics x2 but he underwent a subsequent CT of the abdomen and pelvis with IV contrast on 06/13/2021 due to persistent pain.  This imaging study showed changes concerning for bowel ischemia and mesenteric thrombosis involving the superior mesenteric vein, peripheral jejunal veins, central splenic vein, main portal vein, right and left intrahepatic portal veins.  Due to this he was transferred to [facility] and underwent emergent removal of 55 cm of ischemic small bowel.  His clinical course during his hospitalization was complicated by multiple thrombectomies performed by interventional radiology which were performed as he developed multiple thrombosis in the SMV, jugular veins, splenic vein, portal vein and intrahepatic veins (thrombectomy only performed from portal,  splenic and superior mesenteric veins ).  He was subsequently reanastomosed and the patient was discharged from the hospital successfully.  The patient was seen by Dr. [name] in August 2022 as he developed an incisional hernia.  He was referred to our clinic to undergo a colonoscopy prior to undergoing hernia repair, as he had a history of diverticulitis in the past.  Last evaluation by hematology (Dr. [name]) was held on 10/25/2021.  Patient discussed with the hematology the possibility of switching to Eliquis or Xarelto but decision was not made yet as the patient was looking at the specific cost of the medication.Patient reports that he is supposed to receive his new DOAC on 11/11/2021 and will stop taking the Arixtra at that time.  He was also recommended to undergo an EGD to evaluate for esophageal varices given the presence of the cavernomatous degeneration of the portal vein.  Finally, Dr. [name] cleared him to undergo his ventral hernia surgery and gave recommendations for his anticoagulation as follow:  "Will Skip 2 Days of Eliquis or Xarelto prior to the Surgery. He will restart Eliquis or Xarelto 1 Day after the Surgery at prophylactic dose, i.e.. 2.5 mg Twice Daily for Eliquis or Xarelto 10 mg Once Daily for Xarelto, until the Surgeon Is Comfortable from a Bleeding Point of View to have him increase the dose to the full maintenance dose of Eliquis 5 mg twice daily or Xarelto 20 mg once daily. It is fine to go ahead with thr hernia surgery on December 7-froma hematology point of view he is cleared."  Patient reports that he has some mild pain at the hernia site. Has not had any recent melena or hematochezia. States he has a long standing history of intermittent diarrhea. Stools can be loose but it can get softer occasionally. He did not  notice any difference in his bowel movement frequency after his surgery.  The patient denies having any nausea, vomiting, fever, chills, hematochezia, melena,  hematemesis, abdominal distention, jaundice, pruritus or weight loss.  Last BMW:[id number] years ago , possibly 8 years, normal per patient Last Colonoscopy: 4 years ago, normal per patient performed at [facility], no report available.  FHx: neg for any gastrointestinal/liver disease, kidney cancer cousin Social: neg smoking, alcohol or illicit drug use  Past Medical History: Past Medical History:  Diagnosis Date   Arthritis    Hx of exploratory laparotomy    enterectomy small intest. resection,celiotomy   Hyperlipidemia    Hypertension    Hypothyroidism    Pre-diabetes    Sleep apnea     Past Surgical History: Past Surgical History:  Procedure Laterality Date   BACK SURGERY     TONSILLECTOMY      Family History: Family History  Problem Relation Age of Onset   COPD Mother    Alzheimer's disease Father     Social History: Social History   Tobacco Use  Smoking Status Never  Smokeless Tobacco Never   Social History   Substance and Sexual Activity  Alcohol Use No   Social History   Substance and Sexual Activity  Drug Use No    Allergies: Allergies  Allergen Reactions   Metformin And Related     Loose bloody stools    Medications: Current Outpatient Medications  Medication Sig Dispense Refill   atorvastatin (LIPITOR) 40 MG tablet TAKE ONE (1) TABLET EACH DAY 90 tablet 0   blood glucose meter kit and supplies Dispense based on patient and insurance preference. Use up to four times daily as directed. (FOR ICD-10 E10.9, E11.9). 1 each 0   fenofibrate 160 MG tablet TAKE ONE (1) TABLET EACH DAY 90 tablet 0   fondaparinux (ARIXTRA) 7.5 MG/0.6ML SOLN injection INJECT THE CONTENTS OF 1 SYRINGE (7.5 MG) UNDER THE SKIN DAILY 18 mL 0   levothyroxine (SYNTHROID) 50 MCG tablet TAKE ONE (1) TABLET EACH DAY 90 tablet 0   metoprolol succinate (TOPROL-XL) 100 MG 24 hr tablet TAKE 1 TABLET ONCE DAILY. TAKE WITH OR IMMEDIATELY FOLLOWING A MEAL 90 tablet 1   testosterone cypionate  (DEPOTESTOSTERONE CYPIONATE) 200 MG/ML injection INJECT 0.5ML IM EVERY 7 DAYS 2 mL 0   valsartan-hydrochlorothiazide (DIOVAN-HCT) 320-25 MG tablet TAKE ONE TABLET BY MOUTH EACH DAY FOR BLOOD PRESSURE 30 tablet 0   acetaminophen (TYLENOL) 325 MG tablet Take 650 mg by mouth every 6 (six) hours as needed for moderate pain.     No current facility-administered medications for this visit.    Review of Systems: GENERAL: negative for malaise, night sweats HEENT: No changes in hearing or vision, no nose bleeds or other nasal problems. NECK: Negative for lumps, goiter, pain and significant neck swelling RESPIRATORY: Negative for cough, wheezing CARDIOVASCULAR: Negative for chest pain, leg swelling, palpitations, orthopnea GI: SEE HPI MUSCULOSKELETAL: Negative for joint pain or swelling, back pain, and muscle pain. SKIN: Negative for lesions, rash PSYCH: Negative for sleep disturbance, mood disorder and recent psychosocial stressors. HEMATOLOGY Negative for prolonged bleeding, bruising easily, and swollen nodes. ENDOCRINE: Negative for cold or heat intolerance, polyuria, polydipsia and goiter. NEURO: negative for tremor, gait imbalance, syncope and seizures. The remainder of the review of systems is noncontributory.   Physical Exam: BP 114/68 (BP Location: Left Arm, Patient Position: Sitting, Cuff Size: Large)   Pulse 94   Temp 98.1 F (36.7 C) (Oral)   Ht 6' 1" (  1.854 m)   Wt 233 lb 4.8 oz (105.8 kg)   BMI 30.78 kg/m  GENERAL: The patient is AO x3, in no acute distress. HEENT: Head is normocephalic and atraumatic. EOMI are intact. Mouth is well hydrated and without lesions. NECK: Supple. No masses LUNGS: Clear to auscultation. No presence of rhonchi/wheezing/rales. Adequate chest expansion HEART: RRR, normal s1 and s2. ABDOMEN: Soft, nontender, no guarding, no peritoneal signs, and nondistended. BS +. Has presence of a large ventral hernia. Has a midline scar. EXTREMITIES: Without any  cyanosis, clubbing, rash, lesions or edema. NEUROLOGIC: AOx3, no focal motor deficit. SKIN: no jaundice, no rashes   Imaging/Labs: as above  I personally reviewed and interpreted the available labs, imaging and endoscopic files.  Impression and Plan: James [name] is a 55 y.o. 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 diff, who presents for evaluation of history of diverticulitis, portal vein with thrombosis,SMV, jugular veins, splenic vein, and intrahepatic veins thrombosis.  The patient had a very complex clinical course since June 2022 where he was found to have venous thrombosis of several segments of his splanchnic and hepatic vasculature.  He developed bowel necrosis due to this and require to undergo resection of 55 cm of the small bowel.  Has not presented any postsurgical complications or diarrhea due to this which is reassuring.  However, he developed an incisional hernia which will be surgical managed in December 7.  He is surgeon requested to proceed with a colonoscopy given his history of diverticulitis which I consider adequate.  We discussed with the patient thoroughly the procedure and he agreed to proceed with this.  On the other hand, his hematologist requested an EGD to evaluate for esophageal varices given the presence of changes in his hepatic vasculature.  I consider this is adequate and we will proceed with both EGD and colonoscopy at the same time, following the anticoagulation recommendations given by his hematologist.  I also explained to him that he will need to continue taking his anticoagulation lifelong as he has a high risk of having thrombosis and there was no clear trigger for his initial thrombosis episode.  - Schedule EGD and colonoscopy - If starting  the oral anticoagulant at least a week before procedure, skip 2 Days of Eliquis or Xarelto prior to the procedures. - If still only on the Arixtra in the week before procedures, patient will need to call to our office for further redirections  All questions were answered.      Total time spent in face-to-face encounter and reviewing medical records: 50 minutes  James Peppers, MD Gastroenterology and Hepatology [facility]

## 2021-11-02 NOTE — Progress Notes (Signed)
[id number] on 11/01/21.

## 2021-11-02 NOTE — Telephone Encounter (Signed)
James [name], CMA  

## 2021-11-14 ENCOUNTER — Ambulatory Visit: Payer: 59 | Admitting: Family Medicine

## 2021-11-14 ENCOUNTER — Encounter: Payer: Self-pay | Admitting: Family Medicine

## 2021-11-14 ENCOUNTER — Other Ambulatory Visit: Payer: Self-pay | Admitting: Family Medicine

## 2021-11-14 VITALS — BP 124/67 | HR 73 | Temp 98.0°F | Ht 73.0 in | Wt 234.4 lb

## 2021-11-14 DIAGNOSIS — I81 Portal vein thrombosis: Secondary | ICD-10-CM | POA: Diagnosis not present

## 2021-11-14 DIAGNOSIS — E119 Type 2 diabetes mellitus without complications: Secondary | ICD-10-CM | POA: Diagnosis not present

## 2021-11-14 DIAGNOSIS — K432 Incisional hernia without obstruction or gangrene: Secondary | ICD-10-CM

## 2021-11-14 DIAGNOSIS — K55069 Acute infarction of intestine, part and extent unspecified: Secondary | ICD-10-CM

## 2021-11-14 NOTE — Progress Notes (Signed)
Subjective:  Patient ID: James [name], male    DOB: Mar 12, 1966  Age: 55 y.o. MRN: [id number]  CC: Follow-up   HPI James [name] presents for recheck of bowel ischemia. bMs soft, but formed. Using stool softener due to ventral hernia.  This continues to be painful and it is so large it gets in the way of activities.  Planned repair on Dec. 7. Having colonoscopy and endoscopy on Dec. 6.  James [name] continues to use Arixtra 7.5 mg subcu daily.  This is for the anticoagulation James [name] needs due to his mesenteric and portal thromboses.   Now on long-term disability. Looking at financial situation closely.  Currently blood sugars are stable.  Depression screen [facility] 2/9 11/14/2021 10/10/2021 09/07/2021  Decreased Interest 0 0 0  Down, Depressed, Hopeless 0 0 0  PHQ - 2 Score 0 0 0  Altered sleeping - 0 0  Tired, decreased energy - 0 0  Change in appetite - 0 0  Feeling bad or failure about yourself  - 0 0  Trouble concentrating - 0 0  Moving slowly or fidgety/restless - 0 0  Suicidal thoughts - 0 0  PHQ-9 Score - 0 0  Difficult doing work/chores - Not difficult at all -  Some recent data might be hidden    History James [name] has a past medical history of Arthritis, exploratory laparotomy, Hyperlipidemia, Hypertension, Hypothyroidism, Pre-diabetes, and Sleep apnea.   James [name] has a past surgical history that includes Back surgery and Tonsillectomy.   His family history includes Alzheimer's disease in his father; COPD in his mother.James [name] reports that James [name] has never smoked. James [name] has never used smokeless tobacco. James [name] reports that James [name] does not drink alcohol and does not use drugs.    ROS Review of Systems  Constitutional:  Negative for fever.  Respiratory:  Negative for shortness of breath.   Cardiovascular:  Negative for chest pain.  Musculoskeletal:  Negative for arthralgias.  Skin:  Negative for rash.   Objective:  BP 124/67   Pulse 73   Temp 98 F (36.7 C)   Ht _0  (1.854 m)   Wt 234 lb 6.4 oz  (106.3 kg)   SpO2 100%   BMI 30.93 kg/m   BP Readings from Last 3 Encounters:  11/14/21 124/67  11/02/21 114/68  11/01/21 (!) 146/82    Wt Readings from Last 3 Encounters:  11/14/21 234 lb 6.4 oz (106.3 kg)  11/02/21 233 lb 4.8 oz (105.8 kg)  11/01/21 229 lb (103.9 kg)     Physical Exam Vitals reviewed.  Constitutional:      Appearance: James [name] is well-developed.  HENT:     Head: Normocephalic and atraumatic.     Right Ear: External ear normal.     Left Ear: External ear normal.     Mouth/Throat:     Pharynx: No oropharyngeal exudate or posterior oropharyngeal erythema.  Eyes:     Pupils: Pupils are equal, round, and reactive to light.  Cardiovascular:     Rate and Rhythm: Normal rate and regular rhythm.     Heart sounds: No murmur heard. Pulmonary:     Effort: No respiratory distress.     Breath sounds: Normal breath sounds.  Abdominal:     General: There is distension.     Hernia: A hernia (16 cm incisional hernia, lower abd, midline, protuberant 4-5 cm) is present.  Musculoskeletal:     Cervical back: Normal range of motion and neck supple.  Neurological:  Mental Status: James [name] is alert and oriented to person, place, and time.      Assessment & Plan:   James [name] was seen today for follow-up.  Diagnoses and all orders for this visit:  Mesenteric thrombosis ([place])  Incisional hernia, without obstruction or gangrene  Portal vein thrombosis  Type 2 diabetes mellitus without complication, without long-term current use of insulin ([place])      I am having James [name] maintain his blood glucose meter kit and supplies, metoprolol succinate, testosterone cypionate, levothyroxine, valsartan-hydrochlorothiazide, fondaparinux, fenofibrate, and atorvastatin.  Allergies as of 11/14/2021       Reactions   Metformin And Related    Loose bloody stools        Medication List        Accurate as of November 14, 2021  5:22 PM. If you have any questions, ask your  nurse or doctor.          atorvastatin 40 MG tablet Commonly known as: LIPITOR TAKE ONE (1) TABLET EACH DAY   blood glucose meter kit and supplies Dispense based on patient and insurance preference. Use up to four times daily as directed. (FOR ICD-10 E10.9, E11.9).   fenofibrate 160 MG tablet TAKE ONE (1) TABLET EACH DAY   fondaparinux 7.5 MG/0.6ML Soln injection Commonly known as: Administrator, sports THE CONTENTS OF 1 SYRINGE (7.5 MG) UNDER THE SKIN DAILY   levothyroxine 50 MCG tablet Commonly known as: SYNTHROID TAKE ONE (1) TABLET EACH DAY   metoprolol succinate 100 MG 24 hr tablet Commonly known as: TOPROL-XL TAKE 1 TABLET ONCE DAILY. TAKE WITH OR IMMEDIATELY FOLLOWING A MEAL   testosterone cypionate 200 MG/ML injection Commonly known as: DEPOTESTOSTERONE CYPIONATE INJECT 0.5ML IM EVERY 7 DAYS   valsartan-hydrochlorothiazide 320-25 MG tablet Commonly known as: DIOVAN-HCT TAKE ONE TABLET BY MOUTH EACH DAY FOR BLOOD PRESSURE         Follow-up: Return in about 3 months (around 02/14/2022).  Claretta Fraise, M.D.

## 2021-11-17 NOTE — Patient Instructions (Signed)
James [name]  11/17/2021     @PREFPERIOPPHARMACY @   Your procedure is scheduled on 11/22/2021.   [facility]   Call this number if you have problems the morning of surgery:  [phone]   Remember:  Follow the diet and prep instructions given to you by the office.    Take these medicines the morning of surgery with A SIP OF WATER             levothyroxine, metoprolol.     Do not wear jewelry, make-up or nail polish.  Do not wear lotions, powders, or perfumes, or deodorant.  Do not shave 48 hours prior to surgery.  Men may shave face and neck.  Do not bring valuables to the hospital.  [facility] is not responsible for any belongings or valuables.  Contacts, dentures or bridgework may not be worn into surgery.  Leave your suitcase in the car.  After surgery it may be brought to your room.  For patients admitted to the hospital, discharge time will be determined by your treatment team.  Patients discharged the day of surgery will not be allowed to drive home and must have someone with them for 24 hours.    Special instructions:   DO NOT smoke tobacco or vape for 24 hours before your procedure.  Please read over the following fact sheets that you were given. Anesthesia Post-op Instructions and Care and Recovery After Surgery      Upper Endoscopy, Adult, Care After This sheet gives you information about how to care for yourself after your procedure. Your health care provider may also give you more specific instructions. If you have problems or questions, contact your health care provider. What can I expect after the procedure? After the procedure, it is common to have: A sore throat. Mild stomach pain or discomfort. Bloating. Nausea. Follow these instructions at home:  Follow instructions from your health care provider about what to eat or drink after your procedure. Return to your normal activities as told by your health care  provider. Ask your health care provider what activities are safe for you. Take over-the-counter and prescription medicines only as told by your health care provider. If you were given a sedative during the procedure, it can affect you for several hours. Do not drive or operate machinery until your health care provider says that it is safe. Keep all follow-up visits as told by your health care provider. This is important. Contact a health care provider if you have: A sore throat that lasts longer than one day. Trouble swallowing. Get help right away if: You vomit blood or your vomit looks like coffee grounds. You have: A fever. Bloody, black, or tarry stools. A severe sore throat or you cannot swallow. Difficulty breathing. Severe pain in your chest or abdomen. Summary After the procedure, it is common to have a sore throat, mild stomach discomfort, bloating, and nausea. If you were given a sedative during the procedure, it can affect you for several hours. Do not drive or operate machinery until your health care provider says that it is safe. Follow instructions from your health care provider about what to eat or drink after your procedure. Return to your normal activities as told by your health care provider. This information is not intended to replace advice given to you by your health care provider. Make sure you discuss any questions you have with your  health care provider. Document Revised: 10/10/2019 Document Reviewed: 05/06/2018 Elsevier Patient Education  2022 [place]. Colonoscopy, Adult, Care After This sheet gives you information about how to care for yourself after your procedure. Your health care provider may also give you more specific instructions. If you have problems or questions, contact your health care provider. What can I expect after the procedure? After the procedure, it is common to have: A small amount of blood in your stool for 24 hours after the  procedure. Some gas. Mild cramping or bloating of your abdomen. Follow these instructions at home: Eating and drinking  Drink enough fluid to keep your urine pale yellow. Follow instructions from your health care provider about eating or drinking restrictions. Resume your normal diet as instructed by your health care provider. Avoid heavy or fried foods that are hard to digest. Activity Rest as told by your health care provider. Avoid sitting for a long time without moving. Get up to take short walks every 1-2 hours. This is important to improve blood flow and breathing. Ask for help if you feel weak or unsteady. Return to your normal activities as told by your health care provider. Ask your health care provider what activities are safe for you. Managing cramping and bloating  Try walking around when you have cramps or feel bloated. Apply heat to your abdomen as told by your health care provider. Use the heat source that your health care provider recommends, such as a moist heat pack or a heating pad. Place a towel between your skin and the heat source. Leave the heat on for 20-30 minutes. Remove the heat if your skin turns bright red. This is especially important if you are unable to feel pain, heat, or cold. You may have a greater risk of getting burned. General instructions If you were given a sedative during the procedure, it can affect you for several hours. Do not drive or operate machinery until your health care provider says that it is safe. For the first 24 hours after the procedure: Do not sign important documents. Do not drink alcohol. Do your regular daily activities at a slower pace than normal. Eat soft foods that are easy to digest. Take over-the-counter and prescription medicines only as told by your health care provider. Keep all follow-up visits as told by your health care provider. This is important. Contact a health care provider if: You have blood in your stool 2-3  days after the procedure. Get help right away if you have: More than a small spotting of blood in your stool. Large blood clots in your stool. Swelling of your abdomen. Nausea or vomiting. A fever. Increasing pain in your abdomen that is not relieved with medicine. Summary After the procedure, it is common to have a small amount of blood in your stool. You may also have mild cramping and bloating of your abdomen. If you were given a sedative during the procedure, it can affect you for several hours. Do not drive or operate machinery until your health care provider says that it is safe. Get help right away if you have a lot of blood in your stool, nausea or vomiting, a fever, or increased pain in your abdomen. This information is not intended to replace advice given to you by your health care provider. Make sure you discuss any questions you have with your health care provider. Document Revised: 10/10/2019 Document Reviewed: 06/30/2019 Elsevier Patient Education  [place] After This  sheet gives you information about how to care for yourself after your procedure. Your health care provider may also give you more specific instructions. If you have problems or questions, contact your health care provider. What can I expect after the procedure? After the procedure, it is common to have: Tiredness. Forgetfulness about what happened after the procedure. Impaired judgment for important decisions. Nausea or vomiting. Some difficulty with balance. Follow these instructions at home: For the time period you were told by your health care provider:   Rest as needed. Do not participate in activities where you could fall or become injured. Do not drive or use machinery. Do not drink alcohol. Do not take sleeping pills or medicines that cause drowsiness. Do not make important decisions or sign legal documents. Do not take care of children on your own. Eating  and drinking Follow the diet that is recommended by your health care provider. Drink enough fluid to keep your urine pale yellow. If you vomit: Drink water, juice, or soup when you can drink without vomiting. Make sure you have little or no nausea before eating solid foods. General instructions Have a responsible adult stay with you for the time you are told. It is important to have someone help care for you until you are awake and alert. Take over-the-counter and prescription medicines only as told by your health care provider. If you have sleep apnea, surgery and certain medicines can increase your risk for breathing problems. Follow instructions from your health care provider about wearing your sleep device: Anytime you are sleeping, including during daytime naps. While taking prescription pain medicines, sleeping medicines, or medicines that make you drowsy. Avoid smoking. Keep all follow-up visits as told by your health care provider. This is important. Contact a health care provider if: You keep feeling nauseous or you keep vomiting. You feel light-headed. You are still sleepy or having trouble with balance after 24 hours. You develop a rash. You have a fever. You have redness or swelling around the IV site. Get help right away if: You have trouble breathing. You have new-onset confusion at home. Summary For several hours after your procedure, you may feel tired. You may also be forgetful and have poor judgment. Have a responsible adult stay with you for the time you are told. It is important to have someone help care for you until you are awake and alert. Rest as told. Do not drive or operate machinery. Do not drink alcohol or take sleeping pills. Get help right away if you have trouble breathing, or if you suddenly become confused. This information is not intended to replace advice given to you by your health care provider. Make sure you discuss any questions you have with your  health care provider. Document Revised: 08/19/2020 Document Reviewed: 11/06/2019 Elsevier Patient Education  2022 [employer].

## 2021-11-18 ENCOUNTER — Encounter (HOSPITAL_COMMUNITY): Payer: Self-pay

## 2021-11-18 ENCOUNTER — Other Ambulatory Visit: Payer: Self-pay

## 2021-11-18 ENCOUNTER — Encounter (HOSPITAL_COMMUNITY)
Admission: RE | Admit: 2021-11-18 | Discharge: 2021-11-18 | Disposition: A | Discharge status: Home / Self Care | Payer: 59 | Source: Ambulatory Visit | Attending: Gastroenterology | Admitting: Gastroenterology

## 2021-11-21 ENCOUNTER — Other Ambulatory Visit: Payer: Self-pay | Admitting: Surgery

## 2021-11-21 LAB — SARS CORONAVIRUS 2 (TAT 6-24 HRS): SARS Coronavirus 2: NEGATIVE

## 2021-11-22 ENCOUNTER — Encounter (HOSPITAL_COMMUNITY): Payer: Self-pay | Admitting: Gastroenterology

## 2021-11-22 ENCOUNTER — Ambulatory Visit (HOSPITAL_COMMUNITY)
Admission: RE | Admit: 2021-11-22 | Discharge: 2021-11-22 | Disposition: A | Discharge status: Home / Self Care | Payer: 59 | Source: Ambulatory Visit | Attending: Gastroenterology | Admitting: Gastroenterology

## 2021-11-22 ENCOUNTER — Ambulatory Visit (HOSPITAL_COMMUNITY): Payer: 59 | Admitting: Anesthesiology

## 2021-11-22 ENCOUNTER — Other Ambulatory Visit: Payer: Self-pay

## 2021-11-22 ENCOUNTER — Encounter (HOSPITAL_COMMUNITY)
Admission: RE | Disposition: A | Discharge status: Home / Self Care | Payer: Self-pay | Source: Ambulatory Visit | Attending: Gastroenterology

## 2021-11-22 DIAGNOSIS — D122 Benign neoplasm of ascending colon: Secondary | ICD-10-CM | POA: Insufficient documentation

## 2021-11-22 DIAGNOSIS — I152 Hypertension secondary to endocrine disorders: Secondary | ICD-10-CM

## 2021-11-22 DIAGNOSIS — Z09 Encounter for follow-up examination after completed treatment for conditions other than malignant neoplasm: Secondary | ICD-10-CM

## 2021-11-22 DIAGNOSIS — K766 Portal hypertension: Secondary | ICD-10-CM | POA: Insufficient documentation

## 2021-11-22 DIAGNOSIS — D124 Benign neoplasm of descending colon: Secondary | ICD-10-CM | POA: Diagnosis not present

## 2021-11-22 DIAGNOSIS — I10 Essential (primary) hypertension: Secondary | ICD-10-CM | POA: Diagnosis not present

## 2021-11-22 DIAGNOSIS — Z86718 Personal history of other venous thrombosis and embolism: Secondary | ICD-10-CM | POA: Diagnosis not present

## 2021-11-22 DIAGNOSIS — I8500 Esophageal varices without bleeding: Secondary | ICD-10-CM | POA: Diagnosis not present

## 2021-11-22 DIAGNOSIS — E119 Type 2 diabetes mellitus without complications: Secondary | ICD-10-CM | POA: Diagnosis not present

## 2021-11-22 DIAGNOSIS — K3189 Other diseases of stomach and duodenum: Secondary | ICD-10-CM | POA: Insufficient documentation

## 2021-11-22 DIAGNOSIS — E785 Hyperlipidemia, unspecified: Secondary | ICD-10-CM | POA: Insufficient documentation

## 2021-11-22 DIAGNOSIS — E1159 Type 2 diabetes mellitus with other circulatory complications: Secondary | ICD-10-CM

## 2021-11-22 DIAGNOSIS — K5732 Diverticulitis of large intestine without perforation or abscess without bleeding: Secondary | ICD-10-CM

## 2021-11-22 DIAGNOSIS — I8510 Secondary esophageal varices without bleeding: Secondary | ICD-10-CM | POA: Diagnosis not present

## 2021-11-22 DIAGNOSIS — K5730 Diverticulosis of large intestine without perforation or abscess without bleeding: Secondary | ICD-10-CM | POA: Diagnosis not present

## 2021-11-22 DIAGNOSIS — G4733 Obstructive sleep apnea (adult) (pediatric): Secondary | ICD-10-CM | POA: Insufficient documentation

## 2021-11-22 DIAGNOSIS — D120 Benign neoplasm of cecum: Secondary | ICD-10-CM | POA: Diagnosis not present

## 2021-11-22 DIAGNOSIS — D125 Benign neoplasm of sigmoid colon: Secondary | ICD-10-CM | POA: Insufficient documentation

## 2021-11-22 DIAGNOSIS — Z13810 Encounter for screening for upper gastrointestinal disorder: Secondary | ICD-10-CM | POA: Diagnosis not present

## 2021-11-22 DIAGNOSIS — K648 Other hemorrhoids: Secondary | ICD-10-CM | POA: Diagnosis not present

## 2021-11-22 HISTORY — PX: ESOPHAGOGASTRODUODENOSCOPY (EGD) WITH PROPOFOL: SHX5813

## 2021-11-22 HISTORY — PX: POLYPECTOMY: SHX5525

## 2021-11-22 HISTORY — PX: COLONOSCOPY WITH PROPOFOL: SHX5780

## 2021-11-22 HISTORY — PX: BIOPSY: SHX5522

## 2021-11-22 LAB — GLUCOSE, CAPILLARY
Glucose-Capillary: 79 mg/dL (ref 70–99)
Glucose-Capillary: 110 mg/dL — ABNORMAL HIGH (ref 70–99)
Glucose-Capillary: 109 mg/dL — ABNORMAL HIGH (ref 70–99)

## 2021-11-22 LAB — HM COLONOSCOPY

## 2021-11-22 SURGERY — COLONOSCOPY WITH PROPOFOL
Anesthesia: General

## 2021-11-22 MED ORDER — PROPOFOL 10 MG/ML IV BOLUS
INTRAVENOUS | Status: DC | PRN
  Administered 2021-11-22: 100 mg via INTRAVENOUS

## 2021-11-22 MED ORDER — LACTATED RINGERS IV SOLN: INTRAVENOUS | Status: DC | PRN

## 2021-11-22 MED ORDER — STERILE WATER FOR IRRIGATION IR SOLN
Status: DC | PRN
  Administered 2021-11-22: 60 mL

## 2021-11-22 MED ORDER — PHENYLEPHRINE HCL (PRESSORS) 10 MG/ML IV SOLN
INTRAVENOUS | Status: DC | PRN
  Administered 2021-11-22 (×2): 100 ug via INTRAVENOUS

## 2021-11-22 MED ORDER — DEXTROSE 50 % IV SOLN: 25.0000 mL | Freq: Once | INTRAVENOUS | Status: AC

## 2021-11-22 MED ORDER — DEXTROSE 50 % IV SOLN
INTRAVENOUS | Status: AC
  Administered 2021-11-22: 25 mL via INTRAVENOUS
  Filled 2021-11-22: qty 50

## 2021-11-22 MED ORDER — FENTANYL CITRATE (PF) 100 MCG/2ML IJ SOLN
INTRAMUSCULAR | Status: AC
  Filled 2021-11-22: qty 2

## 2021-11-22 MED ORDER — FENTANYL CITRATE (PF) 100 MCG/2ML IJ SOLN
INTRAMUSCULAR | Status: DC | PRN
  Administered 2021-11-22: 50 ug via INTRAVENOUS

## 2021-11-22 MED ORDER — PHENYLEPHRINE 40 MCG/ML (10ML) SYRINGE FOR IV PUSH (FOR BLOOD PRESSURE SUPPORT)
PREFILLED_SYRINGE | INTRAVENOUS | Status: AC
  Filled 2021-11-22: qty 10

## 2021-11-22 MED ORDER — SODIUM CHLORIDE 0.9 % IV SOLN: INTRAVENOUS | Status: DC

## 2021-11-22 MED ORDER — PROPOFOL 1000 MG/100ML IV EMUL
INTRAVENOUS | Status: AC
  Filled 2021-11-22: qty 100

## 2021-11-22 MED ORDER — CARVEDILOL 3.125 MG PO TABS: 3.1250 mg | ORAL_TABLET | Freq: Two times a day (BID) | ORAL | 1 refills | Status: DC

## 2021-11-22 MED ORDER — PROPOFOL 500 MG/50ML IV EMUL
INTRAVENOUS | Status: DC | PRN
Start: 2021-11-22 — End: 2021-11-22
  Administered 2021-11-22: 150 ug/kg/min via INTRAVENOUS

## 2021-11-22 MED ORDER — PROPOFOL 10 MG/ML IV BOLUS
INTRAVENOUS | Status: AC
  Filled 2021-11-22: qty 20

## 2021-11-22 NOTE — Op Note (Signed)
[facility] Patient Name: James [name] Procedure Date: 11/22/2021 9:03 AM MRN: [id number] Date of Birth: [birth year] Attending MD: Maylon Peppers ,  CSN: [id number] Age: 55 Admit Type: Outpatient Procedure:                Upper GI endoscopy Indications:              Variceal screening (no known varices or prior                            bleeding), history of cavernomatous degeneration of                            portal vein. Providers:                Maylon Peppers, Janeece Riggers, RN, Toni Amend Referring MD:              Medicines:                Monitored Anesthesia Care Complications:            No immediate complications. Estimated Blood Loss:     Estimated blood loss: none. Procedure:                Pre-Anesthesia Assessment:                           - Prior to the procedure, a History and Physical                            was performed, and patient medications, allergies                            and sensitivities were reviewed. The patient's                            tolerance of previous anesthesia was reviewed.                           - The risks and benefits of the procedure and the                            sedation options and risks were discussed with the                            patient. All questions were answered and informed                            consent was obtained.                           - ASA Grade Assessment: III - A patient with severe                            systemic disease.                           After obtaining informed consent, the endoscope  was                            passed under direct vision. Throughout the                            procedure, the patient's blood pressure, pulse, and                            oxygen saturations were monitored continuously. The                            [id number]-[id number] ([id number]) scope was introduced through the                            mouth, and advanced to the second part of duodenum.                             The upper GI endoscopy was accomplished without                            difficulty. The patient tolerated the procedure                            well. Scope In: 9:25:16 AM Scope Out: 9:30:03 AM Total Procedure Duration: 0 hours 4 minutes 47 seconds  Findings:      Very subtle grade I varices were found in the lower third of the       esophagus. They were small in size. No stigmata of recent bleeding was       observed.      Moderate portal hypertensive gastropathy was found in the entire       examined stomach. Upon careful inspection, no gastric varices were       observed.      The examined duodenum was normal. Impression:               - Grade I esophageal varices.                           - Portal hypertensive gastropathy.                           - Normal examined duodenum.                           - No specimens collected. Moderate Sedation:      Per Anesthesia Care Recommendation:           - Discharge patient to home (ambulatory).                           - Resume previous diet.                           - Repeat upper endoscopy in 1 year for surveillance.                           -  Stop metoprolol and start carvedilol 3.125 mg                            twice a day                           - Follow up in GI clinic in 3 months Procedure Code(s):        --- Professional ---                           [phone], Esophagogastroduodenoscopy, flexible,                            transoral; diagnostic, including collection of                            specimen(s) by brushing or washing, when performed                            (separate procedure) Diagnosis Code(s):        --- Professional ---                           I85.00, Esophageal varices without bleeding                           K76.6, Portal hypertension                           K31.89, Other diseases of stomach and duodenum                           Z13.810, Encounter for screening  for upper                            gastrointestinal disorder CPT copyright 2019 American Medical Association. All rights reserved. The codes documented in this report are preliminary and upon coder review may  be revised to meet current compliance requirements. Maylon Peppers, MD Maylon Peppers,  11/22/2021 10:17:34 AM This report has been signed electronically. Number of Addenda: 0

## 2021-11-22 NOTE — Transfer of Care (Signed)
Immediate Anesthesia Transfer of Care Note  Patient: James [name]  Procedure(s) Performed: COLONOSCOPY WITH PROPOFOL ESOPHAGOGASTRODUODENOSCOPY (EGD) WITH PROPOFOL POLYPECTOMY BIOPSY  Patient Location: PACU  Anesthesia Type:General  Level of Consciousness: awake, alert , oriented and patient cooperative  Airway & Oxygen Therapy: Patient Spontanous Breathing  Post-op Assessment: Report given to RN, Post -op Vital signs reviewed and stable and Patient moving all extremities X 4  Post vital signs: Reviewed and stable  Last Vitals:  Vitals Value Taken Time  BP    Temp    Pulse    Resp    SpO2      Last Pain:  Vitals:   11/22/21 1019  TempSrc: Oral  PainSc: 0-No pain      Patients Stated Pain Goal: 5 ([id number]/41 [id number])  Complications: No notable events documented.

## 2021-11-22 NOTE — Op Note (Signed)
[facility] Patient Name: James [name] Procedure Date: 11/22/2021 9:33 AM MRN: [id number] Date of Birth: July 28, 1966 Attending MD: Maylon Peppers ,  CSN: [id number] Age: 55 Admit Type: Outpatient Procedure:                Colonoscopy Indications:              Follow-up of diverticulitis Providers:                Maylon Peppers, Janeece Riggers, RN, Suzan Garibaldi.                            Risa Grill, Technician Referring MD:              Medicines:                Monitored Anesthesia Care Complications:            No immediate complications. Estimated Blood Loss:     Estimated blood loss: none. Procedure:                Pre-Anesthesia Assessment:                           - Prior to the procedure, a History and Physical                            was performed, and patient medications, allergies                            and sensitivities were reviewed. The patient's                            tolerance of previous anesthesia was reviewed.                           - The risks and benefits of the procedure and the                            sedation options and risks were discussed with the                            patient. All questions were answered and informed                            consent was obtained.                           - ASA Grade Assessment: III - A patient with severe                            systemic disease.                           After obtaining informed consent, the colonoscope                            was passed under direct vision. Throughout the  procedure, the patient's blood pressure, pulse, and                            oxygen saturations were monitored continuously. The                            [id number]-HQ190L ([id number]) scope was introduced through                            the anus and advanced to the the cecum, identified                            by appendiceal orifice and ileocecal valve. The                             colonoscopy was performed without difficulty. The                            patient tolerated the procedure well. The quality                            of the bowel preparation was adequate. Scope In: 9:35:57 AM Scope Out: 10:12:21 AM Scope Withdrawal Time: 0 hours 27 minutes 49 seconds  Total Procedure Duration: 0 hours 36 minutes 24 seconds  Findings:      The perianal and digital rectal examinations were normal.      Six sessile polyps were found in the cecum. The polyps were 1 to 2 mm in       size. These polyps were removed with a cold biopsy forceps. Resection       and retrieval were complete.      Four sessile polyps were found in the sigmoid colon, ascending colon and       cecum. The polyps were 2 to 5 mm in size. These polyps were removed with       a cold snare. Resection and retrieval were complete.      Multiple small and large-mouthed diverticula were found in the sigmoid       colon, descending colon, transverse colon and ascending colon.      Non-bleeding internal hemorrhoids were found during retroflexion. The       hemorrhoids were small. Impression:               - Six 1 to 2 mm polyps in the cecum, removed with a                            cold biopsy forceps. Resected and retrieved.                           - Four 2 to 5 mm polyps in the sigmoid colon, in                            the ascending colon and in the cecum, removed with                            a  cold snare. Resected and retrieved.                           - Diverticulosis in the sigmoid colon, in the                            descending colon, in the transverse colon and in                            the ascending colon.                           - Non-bleeding internal hemorrhoids. Moderate Sedation:      Per Anesthesia Care Recommendation:           - Discharge patient to home (ambulatory).                           - Resume previous diet.                           - Await pathology  results.                           - Repeat colonoscopy for surveillance based on                            pathology results. Procedure Code(s):        --- Professional ---                           [phone], Colonoscopy, flexible; with removal of                            tumor(s), polyp(s), or other lesion(s) by snare                            technique                           45380, 65, Colonoscopy, flexible; with biopsy,                            single or multiple Diagnosis Code(s):        --- Professional ---                           K63.5, Polyp of colon                           K64.8, Other hemorrhoids                           K57.32, Diverticulitis of large intestine without                            perforation or abscess without bleeding  K57.30, Diverticulosis of large intestine without                            perforation or abscess without bleeding CPT copyright 2019 American Medical Association. All rights reserved. The codes documented in this report are preliminary and upon coder review may  be revised to meet current compliance requirements. Maylon Peppers, MD Maylon Peppers,  11/22/2021 10:21:32 AM This report has been signed electronically. Number of Addenda: 0

## 2021-11-22 NOTE — Anesthesia Preprocedure Evaluation (Addendum)
Anesthesia Evaluation  Patient identified by MRN, date of birth, ID band Patient awake    Reviewed: Allergy & Precautions, NPO status , Patient's Chart, lab work & pertinent test results  Airway Mallampati: III  TM Distance: >3 FB Neck ROM: Full    Dental  (+) Dental Advisory Given, Chipped, Missing,    Pulmonary sleep apnea and Continuous Positive Airway Pressure Ventilation ,    Pulmonary exam normal breath sounds clear to auscultation       Cardiovascular Exercise Tolerance: Good hypertension, Pt. on medications  Rhythm:Regular Rate:Tachycardia  01-Nov-2021 13:14:17 [facility]-PRE ROUTINE RECORD 1966-01-31 (81 yr) Male Caucasian Vent. rate 80 BPM PR interval 152 ms QRS duration 96 ms QT/QTcB 336/387 ms P-R-T axes 35 34 57 Normal sinus rhythm Nonspecific T wave abnormality Abnormal ECG No significant change since last tracing Confirmed by Daneen Schick [phone]) on 11/01/2021 5:13:38 PM   Neuro/Psych negative neurological ROS  negative psych ROS   GI/Hepatic negative GI ROS, Neg liver ROS,   Endo/Other  diabetes, Well ControlledHypothyroidism   Renal/GU      Musculoskeletal  (+) Arthritis , Osteoarthritis,    Abdominal   Peds  Hematology   Anesthesia Other Findings   Reproductive/Obstetrics                           Anesthesia Physical Anesthesia Plan  ASA: 3  Anesthesia Plan: General   Post-op Pain Management: Minimal or no pain anticipated   Induction:   PONV Risk Score and Plan: TIVA  Airway Management Planned: Nasal Cannula and Natural Airway  Additional Equipment:   Intra-op Plan:   Post-operative Plan:   Informed Consent: I have reviewed the patients History and Physical, chart, labs and discussed the procedure including the risks, benefits and alternatives for the proposed anesthesia with the patient or authorized representative who has  indicated his/her understanding and acceptance.     Dental advisory given  Plan Discussed with: CRNA and Surgeon  Anesthesia Plan Comments:        Anesthesia Quick Evaluation

## 2021-11-22 NOTE — Discharge Instructions (Addendum)
You are being discharged to home.  Resume your previous diet.  Your physician has recommended a repeat upper endoscopy in one year for surveillance.  Stop metoprolol and start carvedilol 3.125 mg twice a day Follow up in GI clinic in 3 months  Office will call with appointment We are waiting for your pathology results.  Your physician has recommended a repeat colonoscopy for surveillance based on pathology results.

## 2021-11-22 NOTE — Anesthesia Postprocedure Evaluation (Signed)
Anesthesia Post Note  Patient: James [name]  Procedure(s) Performed: COLONOSCOPY WITH PROPOFOL ESOPHAGOGASTRODUODENOSCOPY (EGD) WITH PROPOFOL POLYPECTOMY BIOPSY  Patient location during evaluation: Phase II Anesthesia Type: General Level of consciousness: awake and alert and oriented Pain management: pain level controlled Vital Signs Assessment: post-procedure vital signs reviewed and stable Respiratory status: spontaneous breathing, nonlabored ventilation and respiratory function stable Cardiovascular status: blood pressure returned to baseline and stable Postop Assessment: no apparent nausea or vomiting Anesthetic complications: no   No notable events documented.   Last Vitals:  Vitals:   11/22/21 1019  BP: 107/66  Pulse: 92  Resp: 18  Temp: 37.1 C  SpO2: 97%    Last Pain:  Vitals:   11/22/21 1019  TempSrc: Oral  PainSc: 0-No pain                 Rechelle Niebla C Jennye Runquist

## 2021-11-22 NOTE — Anesthesia Preprocedure Evaluation (Addendum)
Anesthesia Evaluation  Patient identified by MRN, date of birth, ID band Patient awake    Reviewed: Allergy & Precautions, NPO status , Patient's Chart, lab work & pertinent test results  History of Anesthesia Complications Negative for: history of anesthetic complications  Airway Mallampati: II  TM Distance: >3 FB Neck ROM: Full    Dental  (+) Chipped, Missing, Dental Advisory Given   Pulmonary sleep apnea and Continuous Positive Airway Pressure Ventilation ,  11/21/2021 SARS coronavirus NEG   breath sounds clear to auscultation       Cardiovascular hypertension, Pt. on medications (-) angina Rhythm:Regular Rate:Normal     Neuro/Psych negative neurological ROS     GI/Hepatic Neg liver ROS, H/o mesenteric ischemia: complex abd surgery   Endo/Other  diabetes (no longer requires medication, glu 105)Hypothyroidism   Renal/GU negative Renal ROS     Musculoskeletal   Abdominal (+) + obese,   Peds  Hematology negative hematology ROS (+)   Anesthesia Other Findings   Reproductive/Obstetrics                            Anesthesia Physical Anesthesia Plan  ASA: 3  Anesthesia Plan: General   Post-op Pain Management: Dilaudid IV and Tylenol PO (pre-op)   Induction: Intravenous  PONV Risk Score and Plan: 2 and Ondansetron and Dexamethasone  Airway Management Planned: Oral ETT  Additional Equipment: None  Intra-op Plan:   Post-operative Plan: Extubation in OR  Informed Consent: I have reviewed the patients History and Physical, chart, labs and discussed the procedure including the risks, benefits and alternatives for the proposed anesthesia with the patient or authorized representative who has indicated his/her understanding and acceptance.     Dental advisory given  Plan Discussed with: CRNA and Surgeon  Anesthesia Plan Comments:        Anesthesia Quick Evaluation

## 2021-11-22 NOTE — Interval H&P Note (Signed)
History and Physical Interval Note:  11/22/2021 8:27 AM  James [name]  has presented today for surgery, with the diagnosis of Mesenteric thrombosis hx of diverticulitis.  The various methods of treatment have been discussed with the patient and family. After consideration of risks, benefits and other options for treatment, the patient has consented to  Procedure(s) with comments: COLONOSCOPY WITH PROPOFOL (N/A) - 8:45 ESOPHAGOGASTRODUODENOSCOPY (EGD) WITH PROPOFOL (N/A) as a surgical intervention.  The patient's history has been reviewed, patient examined, no change in status, stable for surgery.  I have reviewed the patient's chart and labs.  Questions were answered to the patient's satisfaction.     Maylon Peppers [place]

## 2021-11-23 ENCOUNTER — Ambulatory Visit (HOSPITAL_COMMUNITY): Payer: 59 | Admitting: Anesthesiology

## 2021-11-23 ENCOUNTER — Observation Stay (HOSPITAL_COMMUNITY)
Admission: RE | Admit: 2021-11-23 | Discharge: 2021-11-24 | Disposition: A | Discharge status: Home / Self Care | Payer: 59 | Attending: Surgery | Admitting: Surgery

## 2021-11-23 ENCOUNTER — Encounter (HOSPITAL_COMMUNITY)
Admission: RE | Disposition: A | Discharge status: Home / Self Care | Payer: Self-pay | Source: Home / Self Care | Attending: Surgery

## 2021-11-23 ENCOUNTER — Encounter (INDEPENDENT_AMBULATORY_CARE_PROVIDER_SITE_OTHER): Payer: Self-pay | Admitting: *Deleted

## 2021-11-23 ENCOUNTER — Encounter (HOSPITAL_COMMUNITY): Payer: Self-pay | Admitting: Surgery

## 2021-11-23 DIAGNOSIS — Z79899 Other long term (current) drug therapy: Secondary | ICD-10-CM | POA: Insufficient documentation

## 2021-11-23 DIAGNOSIS — K3189 Other diseases of stomach and duodenum: Secondary | ICD-10-CM

## 2021-11-23 DIAGNOSIS — I10 Essential (primary) hypertension: Secondary | ICD-10-CM | POA: Insufficient documentation

## 2021-11-23 DIAGNOSIS — K432 Incisional hernia without obstruction or gangrene: Secondary | ICD-10-CM

## 2021-11-23 DIAGNOSIS — Z8719 Personal history of other diseases of the digestive system: Secondary | ICD-10-CM

## 2021-11-23 DIAGNOSIS — E119 Type 2 diabetes mellitus without complications: Secondary | ICD-10-CM | POA: Insufficient documentation

## 2021-11-23 DIAGNOSIS — K430 Incisional hernia with obstruction, without gangrene: Secondary | ICD-10-CM | POA: Diagnosis present

## 2021-11-23 DIAGNOSIS — K766 Portal hypertension: Secondary | ICD-10-CM | POA: Diagnosis not present

## 2021-11-23 HISTORY — PX: INCISIONAL HERNIA REPAIR: SHX193

## 2021-11-23 LAB — GLUCOSE, CAPILLARY
Glucose-Capillary: 105 mg/dL — ABNORMAL HIGH (ref 70–99)
Glucose-Capillary: 163 mg/dL — ABNORMAL HIGH (ref 70–99)

## 2021-11-23 LAB — POCT I-STAT, CHEM 8
BUN: 8 mg/dL (ref 6–20)
Calcium, Ion: 1.2 mmol/L (ref 1.15–1.40)
Chloride: 100 mmol/L (ref 98–111)
Creatinine, Ser: 1.1 mg/dL (ref 0.61–1.24)
Glucose, Bld: 140 mg/dL — ABNORMAL HIGH (ref 70–99)
HCT: 27 % — ABNORMAL LOW (ref 39.0–52.0)
Hemoglobin: 9.2 g/dL — ABNORMAL LOW (ref 13.0–17.0)
Potassium: 3.8 mmol/L (ref 3.5–5.1)
Sodium: 135 mmol/L (ref 135–145)
TCO2: 22 mmol/L (ref 22–32)

## 2021-11-23 LAB — SURGICAL PATHOLOGY

## 2021-11-23 SURGERY — REPAIR, HERNIA, INCISIONAL
Anesthesia: General | Site: Abdomen

## 2021-11-23 MED ORDER — DIPHENHYDRAMINE HCL 50 MG/ML IJ SOLN: 12.5000 mg | Freq: Four times a day (QID) | INTRAMUSCULAR | Status: DC | PRN

## 2021-11-23 MED ORDER — CELECOXIB 200 MG PO CAPS
200.0000 mg | ORAL_CAPSULE | ORAL | Status: AC
  Administered 2021-11-23: 200 mg via ORAL
  Filled 2021-11-23: qty 1

## 2021-11-23 MED ORDER — METHOCARBAMOL 1000 MG/10ML IJ SOLN
500.0000 mg | Freq: Four times a day (QID) | INTRAVENOUS | Status: DC | PRN
  Filled 2021-11-23: qty 5

## 2021-11-23 MED ORDER — ALBUMIN HUMAN 5 % IV SOLN
INTRAVENOUS | Status: AC
  Filled 2021-11-23: qty 250

## 2021-11-23 MED ORDER — ALBUMIN HUMAN 5 % IV SOLN: INTRAVENOUS | Status: DC | PRN

## 2021-11-23 MED ORDER — CARVEDILOL 3.125 MG PO TABS
3.1250 mg | ORAL_TABLET | Freq: Two times a day (BID) | ORAL | Status: DC
  Administered 2021-11-23 – 2021-11-24 (×2): 3.125 mg via ORAL
  Filled 2021-11-23 (×2): qty 1

## 2021-11-23 MED ORDER — SODIUM CHLORIDE 0.9 % IV SOLN: 1000.0000 mL | Freq: Three times a day (TID) | INTRAVENOUS | Status: DC | PRN

## 2021-11-23 MED ORDER — METHOCARBAMOL 750 MG PO TABS
750.0000 mg | ORAL_TABLET | Freq: Four times a day (QID) | ORAL | 2 refills | Status: DC | PRN
Start: 2021-11-23 — End: 2022-03-02

## 2021-11-23 MED ORDER — SODIUM CHLORIDE 0.9 % IV SOLN: 250.0000 mL | INTRAVENOUS | Status: DC | PRN

## 2021-11-23 MED ORDER — ENALAPRILAT 1.25 MG/ML IV SOLN
0.6250 mg | Freq: Four times a day (QID) | INTRAVENOUS | Status: DC | PRN
Start: 2021-11-23 — End: 2021-11-24
  Filled 2021-11-23: qty 1

## 2021-11-23 MED ORDER — LACTATED RINGERS IV BOLUS
1000.0000 mL | Freq: Three times a day (TID) | INTRAVENOUS | Status: DC | PRN
  Administered 2021-11-24: 1000 mL via INTRAVENOUS

## 2021-11-23 MED ORDER — METHOCARBAMOL 500 MG PO TABS
1000.0000 mg | ORAL_TABLET | Freq: Four times a day (QID) | ORAL | Status: DC
Start: 2021-11-23 — End: 2021-11-23

## 2021-11-23 MED ORDER — ACETAMINOPHEN 500 MG PO TABS: 1000.0000 mg | ORAL_TABLET | Freq: Once | ORAL | Status: DC

## 2021-11-23 MED ORDER — PHENYLEPHRINE 40 MCG/ML (10ML) SYRINGE FOR IV PUSH (FOR BLOOD PRESSURE SUPPORT)
PREFILLED_SYRINGE | INTRAVENOUS | Status: DC | PRN
  Administered 2021-11-23: 80 ug via INTRAVENOUS
  Administered 2021-11-23: 60 ug via INTRAVENOUS
  Administered 2021-11-23 (×3): 80 ug via INTRAVENOUS

## 2021-11-23 MED ORDER — OXYCODONE HCL 5 MG PO TABS: 5.0000 mg | ORAL_TABLET | Freq: Four times a day (QID) | ORAL | 0 refills | Status: DC | PRN

## 2021-11-23 MED ORDER — ROCURONIUM BROMIDE 10 MG/ML (PF) SYRINGE
PREFILLED_SYRINGE | INTRAVENOUS | Status: AC
  Filled 2021-11-23: qty 10

## 2021-11-23 MED ORDER — ONDANSETRON HCL 4 MG/2ML IJ SOLN
INTRAMUSCULAR | Status: DC | PRN
  Administered 2021-11-23: 4 mg via INTRAVENOUS

## 2021-11-23 MED ORDER — EPHEDRINE SULFATE-NACL 50-0.9 MG/10ML-% IV SOSY
PREFILLED_SYRINGE | INTRAVENOUS | Status: DC | PRN
  Administered 2021-11-23 (×2): 10 mg via INTRAVENOUS
  Administered 2021-11-23: 5 mg via INTRAVENOUS

## 2021-11-23 MED ORDER — EPHEDRINE 5 MG/ML INJ
INTRAVENOUS | Status: AC
  Filled 2021-11-23: qty 5

## 2021-11-23 MED ORDER — LIDOCAINE HCL 2 % IJ SOLN
INTRAMUSCULAR | Status: AC
  Filled 2021-11-23: qty 20

## 2021-11-23 MED ORDER — ONDANSETRON HCL 4 MG/2ML IJ SOLN
INTRAMUSCULAR | Status: AC
  Filled 2021-11-23: qty 2

## 2021-11-23 MED ORDER — DIPHENHYDRAMINE HCL 12.5 MG/5ML PO ELIX: 12.5000 mg | ORAL_SOLUTION | Freq: Four times a day (QID) | ORAL | Status: DC | PRN

## 2021-11-23 MED ORDER — BUPIVACAINE-EPINEPHRINE 0.25% -1:200000 IJ SOLN
INTRAMUSCULAR | Status: DC | PRN
  Administered 2021-11-23: 80 mL

## 2021-11-23 MED ORDER — OXYCODONE HCL 5 MG PO TABS: 5.0000 mg | ORAL_TABLET | Freq: Once | ORAL | Status: DC | PRN

## 2021-11-23 MED ORDER — KETAMINE HCL 10 MG/ML IJ SOLN
INTRAMUSCULAR | Status: DC | PRN
  Administered 2021-11-23 (×3): 10 mg via INTRAVENOUS
  Administered 2021-11-23: 30 mg via INTRAVENOUS
  Administered 2021-11-23: 10 mg via INTRAVENOUS

## 2021-11-23 MED ORDER — BUPIVACAINE LIPOSOME 1.3 % IJ SUSP
INTRAMUSCULAR | Status: DC | PRN
  Administered 2021-11-23: 20 mL

## 2021-11-23 MED ORDER — MIDAZOLAM HCL 2 MG/2ML IJ SOLN
INTRAMUSCULAR | Status: DC | PRN
  Administered 2021-11-23: 2 mg via INTRAVENOUS

## 2021-11-23 MED ORDER — LIDOCAINE HCL (PF) 2 % IJ SOLN
INTRAMUSCULAR | Status: AC
  Filled 2021-11-23: qty 5

## 2021-11-23 MED ORDER — HYDROMORPHONE HCL 1 MG/ML IJ SOLN: 0.2500 mg | INTRAMUSCULAR | Status: DC | PRN

## 2021-11-23 MED ORDER — PHENYLEPHRINE HCL-NACL 20-0.9 MG/250ML-% IV SOLN
INTRAVENOUS | Status: DC | PRN
  Administered 2021-11-23: 50 ug/min via INTRAVENOUS

## 2021-11-23 MED ORDER — CHLORHEXIDINE GLUCONATE CLOTH 2 % EX PADS: 6.0000 | MEDICATED_PAD | Freq: Once | CUTANEOUS | Status: DC

## 2021-11-23 MED ORDER — ENSURE PRE-SURGERY PO LIQD
592.0000 mL | Freq: Once | ORAL | Status: DC
  Filled 2021-11-23: qty 592

## 2021-11-23 MED ORDER — CEFAZOLIN SODIUM-DEXTROSE 2-4 GM/100ML-% IV SOLN
2.0000 g | INTRAVENOUS | Status: AC
  Administered 2021-11-23: 2 g via INTRAVENOUS
  Filled 2021-11-23: qty 100

## 2021-11-23 MED ORDER — LIDOCAINE 2% (20 MG/ML) 5 ML SYRINGE
INTRAMUSCULAR | Status: DC | PRN
  Administered 2021-11-23: 1.5 mg/kg/h via INTRAVENOUS

## 2021-11-23 MED ORDER — PROMETHAZINE HCL 25 MG/ML IJ SOLN: 6.2500 mg | INTRAMUSCULAR | Status: DC | PRN

## 2021-11-23 MED ORDER — BUPIVACAINE LIPOSOME 1.3 % IJ SUSP: 20.0000 mL | Freq: Once | INTRAMUSCULAR | Status: DC

## 2021-11-23 MED ORDER — PHENYLEPHRINE HCL (PRESSORS) 10 MG/ML IV SOLN
INTRAVENOUS | Status: AC
  Filled 2021-11-23: qty 1

## 2021-11-23 MED ORDER — ENOXAPARIN SODIUM 40 MG/0.4ML IJ SOSY
40.0000 mg | PREFILLED_SYRINGE | INTRAMUSCULAR | Status: DC
  Administered 2021-11-24: 40 mg via SUBCUTANEOUS
  Filled 2021-11-23: qty 0.4

## 2021-11-23 MED ORDER — HYDROCHLOROTHIAZIDE 25 MG PO TABS
25.0000 mg | ORAL_TABLET | Freq: Every day | ORAL | Status: DC
  Administered 2021-11-23: 25 mg via ORAL
  Filled 2021-11-23: qty 1

## 2021-11-23 MED ORDER — LIDOCAINE 2% (20 MG/ML) 5 ML SYRINGE
INTRAMUSCULAR | Status: DC | PRN
  Administered 2021-11-23: 25 mg via INTRAVENOUS

## 2021-11-23 MED ORDER — SUGAMMADEX SODIUM 500 MG/5ML IV SOLN
INTRAVENOUS | Status: DC | PRN
  Administered 2021-11-23: 230 mg via INTRAVENOUS

## 2021-11-23 MED ORDER — DEXAMETHASONE SODIUM PHOSPHATE 10 MG/ML IJ SOLN
INTRAMUSCULAR | Status: AC
  Filled 2021-11-23: qty 1

## 2021-11-23 MED ORDER — LACTATED RINGERS IV SOLN: INTRAVENOUS | Status: DC

## 2021-11-23 MED ORDER — BUPIVACAINE LIPOSOME 1.3 % IJ SUSP
INTRAMUSCULAR | Status: AC
  Filled 2021-11-23: qty 20

## 2021-11-23 MED ORDER — KETAMINE HCL 10 MG/ML IJ SOLN
INTRAMUSCULAR | Status: AC
  Filled 2021-11-23: qty 1

## 2021-11-23 MED ORDER — GABAPENTIN 300 MG PO CAPS
300.0000 mg | ORAL_CAPSULE | ORAL | Status: AC
  Administered 2021-11-23: 300 mg via ORAL
  Filled 2021-11-23: qty 1

## 2021-11-23 MED ORDER — OXYCODONE HCL 5 MG/5ML PO SOLN: 5.0000 mg | Freq: Once | ORAL | Status: DC | PRN

## 2021-11-23 MED ORDER — MIDAZOLAM HCL 2 MG/2ML IJ SOLN
INTRAMUSCULAR | Status: AC
  Filled 2021-11-23: qty 2

## 2021-11-23 MED ORDER — FENTANYL CITRATE (PF) 250 MCG/5ML IJ SOLN
INTRAMUSCULAR | Status: AC
  Filled 2021-11-23: qty 5

## 2021-11-23 MED ORDER — SIMETHICONE 80 MG PO CHEW: 40.0000 mg | CHEWABLE_TABLET | Freq: Four times a day (QID) | ORAL | Status: DC | PRN

## 2021-11-23 MED ORDER — IRBESARTAN 150 MG PO TABS
300.0000 mg | ORAL_TABLET | Freq: Every day | ORAL | Status: DC
  Administered 2021-11-23: 15:00:00 300 mg via ORAL
  Filled 2021-11-23: qty 2

## 2021-11-23 MED ORDER — MEPERIDINE HCL 50 MG/ML IJ SOLN: 6.2500 mg | INTRAMUSCULAR | Status: DC | PRN

## 2021-11-23 MED ORDER — LEVOTHYROXINE SODIUM 50 MCG PO TABS
50.0000 ug | ORAL_TABLET | Freq: Every day | ORAL | Status: DC
  Administered 2021-11-24: 50 ug via ORAL
  Filled 2021-11-23: qty 1

## 2021-11-23 MED ORDER — METHOCARBAMOL 500 MG IVPB - SIMPLE MED
500.0000 mg | Freq: Three times a day (TID) | INTRAVENOUS | Status: DC
Start: 2021-11-23 — End: 2021-11-23

## 2021-11-23 MED ORDER — ACETAMINOPHEN 500 MG PO TABS
1000.0000 mg | ORAL_TABLET | ORAL | Status: AC
  Administered 2021-11-23: 1000 mg via ORAL
  Filled 2021-11-23: qty 2

## 2021-11-23 MED ORDER — FENOFIBRATE 160 MG PO TABS
160.0000 mg | ORAL_TABLET | Freq: Every day | ORAL | Status: DC
  Administered 2021-11-23: 160 mg via ORAL
  Filled 2021-11-23: qty 1

## 2021-11-23 MED ORDER — ORAL CARE MOUTH RINSE: 15.0000 mL | Freq: Once | OROMUCOSAL | Status: AC

## 2021-11-23 MED ORDER — VALSARTAN-HYDROCHLOROTHIAZIDE 320-25 MG PO TABS: 1.0000 | ORAL_TABLET | Freq: Every day | ORAL | Status: DC

## 2021-11-23 MED ORDER — METOPROLOL TARTRATE 5 MG/5ML IV SOLN: 5.0000 mg | Freq: Four times a day (QID) | INTRAVENOUS | Status: DC | PRN

## 2021-11-23 MED ORDER — LACTATED RINGERS IV SOLN: INTRAVENOUS | Status: DC | PRN

## 2021-11-23 MED ORDER — MIDAZOLAM HCL 2 MG/2ML IJ SOLN: 0.5000 mg | Freq: Once | INTRAMUSCULAR | Status: DC | PRN

## 2021-11-23 MED ORDER — LIP MEDEX EX OINT
1.0000 "application " | TOPICAL_OINTMENT | Freq: Two times a day (BID) | CUTANEOUS | Status: DC
  Administered 2021-11-23 – 2021-11-24 (×2): 1 via TOPICAL
  Filled 2021-11-23: qty 7

## 2021-11-23 MED ORDER — BUPIVACAINE-EPINEPHRINE 0.25% -1:200000 IJ SOLN
INTRAMUSCULAR | Status: AC
  Filled 2021-11-23: qty 1

## 2021-11-23 MED ORDER — PHENYLEPHRINE 40 MCG/ML (10ML) SYRINGE FOR IV PUSH (FOR BLOOD PRESSURE SUPPORT)
PREFILLED_SYRINGE | INTRAVENOUS | Status: AC
  Filled 2021-11-23: qty 10

## 2021-11-23 MED ORDER — ACETAMINOPHEN 500 MG PO TABS
1000.0000 mg | ORAL_TABLET | Freq: Four times a day (QID) | ORAL | Status: DC
  Administered 2021-11-23 – 2021-11-24 (×4): 1000 mg via ORAL
  Filled 2021-11-23 (×4): qty 2

## 2021-11-23 MED ORDER — MAGNESIUM HYDROXIDE 400 MG/5ML PO SUSP: 30.0000 mL | Freq: Every day | ORAL | Status: DC | PRN

## 2021-11-23 MED ORDER — ATORVASTATIN CALCIUM 40 MG PO TABS
40.0000 mg | ORAL_TABLET | Freq: Every day | ORAL | Status: DC
  Administered 2021-11-23: 40 mg via ORAL
  Filled 2021-11-23: qty 1

## 2021-11-23 MED ORDER — MAGIC MOUTHWASH
15.0000 mL | Freq: Four times a day (QID) | ORAL | Status: DC | PRN
  Filled 2021-11-23: qty 15

## 2021-11-23 MED ORDER — METHOCARBAMOL 500 MG PO TABS
500.0000 mg | ORAL_TABLET | Freq: Four times a day (QID) | ORAL | Status: DC | PRN
  Administered 2021-11-23: 500 mg via ORAL
  Filled 2021-11-23: qty 1

## 2021-11-23 MED ORDER — ROCURONIUM BROMIDE 10 MG/ML (PF) SYRINGE
PREFILLED_SYRINGE | INTRAVENOUS | Status: DC | PRN
  Administered 2021-11-23 (×3): 30 mg via INTRAVENOUS
  Administered 2021-11-23: 60 mg via INTRAVENOUS
  Administered 2021-11-23: 30 mg via INTRAVENOUS

## 2021-11-23 MED ORDER — FENTANYL CITRATE (PF) 100 MCG/2ML IJ SOLN
INTRAMUSCULAR | Status: AC
  Filled 2021-11-23: qty 2

## 2021-11-23 MED ORDER — SODIUM CHLORIDE 0.9% FLUSH
3.0000 mL | Freq: Two times a day (BID) | INTRAVENOUS | Status: DC
  Administered 2021-11-23: 3 mL via INTRAVENOUS

## 2021-11-23 MED ORDER — DEXAMETHASONE SODIUM PHOSPHATE 10 MG/ML IJ SOLN
INTRAMUSCULAR | Status: DC | PRN
  Administered 2021-11-23: 10 mg via INTRAVENOUS

## 2021-11-23 MED ORDER — CALCIUM POLYCARBOPHIL 625 MG PO TABS
625.0000 mg | ORAL_TABLET | Freq: Two times a day (BID) | ORAL | Status: DC
  Administered 2021-11-23 – 2021-11-24 (×3): 625 mg via ORAL
  Filled 2021-11-23 (×3): qty 1

## 2021-11-23 MED ORDER — SUGAMMADEX SODIUM 500 MG/5ML IV SOLN
INTRAVENOUS | Status: AC
  Filled 2021-11-23: qty 5

## 2021-11-23 MED ORDER — SODIUM CHLORIDE 0.9 % IR SOLN
Status: DC | PRN
  Administered 2021-11-23: 2000 mL

## 2021-11-23 MED ORDER — TRAMADOL HCL 50 MG PO TABS
50.0000 mg | ORAL_TABLET | Freq: Four times a day (QID) | ORAL | Status: DC | PRN
  Administered 2021-11-23 – 2021-11-24 (×2): 50 mg via ORAL
  Filled 2021-11-23 (×2): qty 1

## 2021-11-23 MED ORDER — GABAPENTIN 300 MG PO CAPS
300.0000 mg | ORAL_CAPSULE | Freq: Two times a day (BID) | ORAL | Status: DC
  Administered 2021-11-23 – 2021-11-24 (×2): 300 mg via ORAL
  Filled 2021-11-23 (×2): qty 1

## 2021-11-23 MED ORDER — CHLORHEXIDINE GLUCONATE 0.12 % MT SOLN
15.0000 mL | Freq: Once | OROMUCOSAL | Status: AC
  Administered 2021-11-23: 15 mL via OROMUCOSAL

## 2021-11-23 MED ORDER — BISACODYL 10 MG RE SUPP: 10.0000 mg | Freq: Every day | RECTAL | Status: DC | PRN

## 2021-11-23 MED ORDER — ENSURE PRE-SURGERY PO LIQD
296.0000 mL | Freq: Once | ORAL | Status: DC
  Filled 2021-11-23: qty 296

## 2021-11-23 MED ORDER — PROPOFOL 10 MG/ML IV BOLUS
INTRAVENOUS | Status: AC
  Filled 2021-11-23: qty 20

## 2021-11-23 MED ORDER — SODIUM CHLORIDE 0.9% FLUSH: 3.0000 mL | INTRAVENOUS | Status: DC | PRN

## 2021-11-23 MED ORDER — FENTANYL CITRATE (PF) 250 MCG/5ML IJ SOLN
INTRAMUSCULAR | Status: DC | PRN
  Administered 2021-11-23: 150 ug via INTRAVENOUS
  Administered 2021-11-23 (×4): 50 ug via INTRAVENOUS

## 2021-11-23 MED ORDER — PROPOFOL 10 MG/ML IV BOLUS
INTRAVENOUS | Status: DC | PRN
  Administered 2021-11-23: 100 mg via INTRAVENOUS

## 2021-11-23 SURGICAL SUPPLY — 53 items
APL PRP STRL LF DISP 70% ISPRP (MISCELLANEOUS) ×1
BAG COUNTER SPONGE SURGICOUNT (BAG) IMPLANT
BAG SPNG CNTER NS LX DISP (BAG)
BINDER ABDOMINAL 12 ML 46-62 (SOFTGOODS) ×1 IMPLANT
CHLORAPREP W/TINT 26 (MISCELLANEOUS) ×2 IMPLANT
CLSR STERI-STRIP ANTIMIC 1/2X4 (GAUZE/BANDAGES/DRESSINGS) ×1 IMPLANT
COVER SURGICAL LIGHT HANDLE (MISCELLANEOUS) ×2 IMPLANT
DECANTER SPIKE VIAL GLASS SM (MISCELLANEOUS) ×2 IMPLANT
DEVICE TROCAR PUNCTURE CLOSURE (ENDOMECHANICALS) ×1 IMPLANT
DRAIN CHANNEL 15F RND FF 3/16 (WOUND CARE) ×1 IMPLANT
DRAIN CHANNEL 19F RND (DRAIN) ×1 IMPLANT
DRAPE LAPAROSCOPIC ABDOMINAL (DRAPES) ×2 IMPLANT
DRAPE WARM FLUID 44X44 (DRAPES) ×1 IMPLANT
DRSG OPSITE POSTOP 4X12 (GAUZE/BANDAGES/DRESSINGS) ×1 IMPLANT
DRSG TEGADERM 2-3/8X2-3/4 SM (GAUZE/BANDAGES/DRESSINGS) IMPLANT
DRSG TEGADERM 4X4.75 (GAUZE/BANDAGES/DRESSINGS) ×1 IMPLANT
ELECT PENCIL ROCKER SW 15FT (MISCELLANEOUS) ×1 IMPLANT
ELECT REM PT RETURN 15FT ADLT (MISCELLANEOUS) ×2 IMPLANT
EVACUATOR SILICONE 100CC (DRAIN) ×2 IMPLANT
GAUZE SPONGE 2X2 8PLY STRL LF (GAUZE/BANDAGES/DRESSINGS) IMPLANT
GLOVE SURG NEOPR MICRO LF SZ8 (GLOVE) ×2 IMPLANT
GLOVE SURG UNDER LTX SZ8 (GLOVE) ×2 IMPLANT
GOWN STRL REUS W/TWL XL LVL3 (GOWN DISPOSABLE) ×4 IMPLANT
KIT BASIN OR (CUSTOM PROCEDURE TRAY) ×2 IMPLANT
KIT TURNOVER KIT A (KITS) IMPLANT
LIGASURE IMPACT 36 18CM CVD LR (INSTRUMENTS) ×1 IMPLANT
MARKER SKIN DUAL TIP RULER LAB (MISCELLANEOUS) ×2 IMPLANT
MESH PROLENE PML 12X12 (Mesh General) ×1 IMPLANT
NS IRRIG 1000ML POUR BTL (IV SOLUTION) ×2 IMPLANT
PACK GENERAL/GYN (CUSTOM PROCEDURE TRAY) ×2 IMPLANT
PAD POSITIONING PINK XL (MISCELLANEOUS) ×2 IMPLANT
SPONGE GAUZE 2X2 STER 10/PKG (GAUZE/BANDAGES/DRESSINGS) ×1
SPONGE T-LAP 18X18 ~~LOC~~+RFID (SPONGE) ×5 IMPLANT
STRIP CLOSURE SKIN 1/2X4 (GAUZE/BANDAGES/DRESSINGS) ×1 IMPLANT
SUT ETHIBOND NAB CT1 #1 30IN (SUTURE) IMPLANT
SUT MNCRL AB 4-0 PS2 18 (SUTURE) ×3 IMPLANT
SUT NOVA NAB DX-16 0-1 5-0 T12 (SUTURE) IMPLANT
SUT PDS AB 1 CT  36 (SUTURE) ×16
SUT PDS AB 1 CT 36 (SUTURE) IMPLANT
SUT PDS AB 2-0 CT2 27 (SUTURE) ×2 IMPLANT
SUT PROLENE 1 CT 1 30 (SUTURE) IMPLANT
SUT PROLENE 2 0 CT 1 (SUTURE) ×2 IMPLANT
SUT SILK 2 0 (SUTURE) ×2
SUT SILK 2 0 SH (SUTURE) ×1 IMPLANT
SUT SILK 2 0 SH CR/8 (SUTURE) ×1 IMPLANT
SUT SILK 2-0 18XBRD TIE 12 (SUTURE) IMPLANT
SUT SILK 3 0 (SUTURE) ×2
SUT SILK 3 0 SH CR/8 (SUTURE) ×1 IMPLANT
SUT SILK 3-0 18XBRD TIE 12 (SUTURE) ×1 IMPLANT
SUT VIC AB 2-0 SH 18 (SUTURE) ×2 IMPLANT
SYR 20ML LL LF (SYRINGE) ×2 IMPLANT
TOWEL OR 17X26 10 PK STRL BLUE (TOWEL DISPOSABLE) ×2 IMPLANT
TOWEL OR NON WOVEN STRL DISP B (DISPOSABLE) ×2 IMPLANT

## 2021-11-23 NOTE — Anesthesia Procedure Notes (Signed)
Procedure Name: Intubation Date/Time: 11/23/2021 8:38 AM Performed by: Sharlette Dense, CRNA Pre-anesthesia Checklist: Patient identified, Emergency Drugs available, Suction available and Patient being monitored Patient Re-evaluated:Patient Re-evaluated prior to induction Oxygen Delivery Method: Circle system utilized Preoxygenation: Pre-oxygenation with 100% oxygen Induction Type: IV induction Ventilation: Oral airway inserted - appropriate to patient size and Two handed mask ventilation required Laryngoscope Size: Glidescope and 4 Grade View: Grade I Tube type: Oral Tube size: 8.0 mm Number of attempts: 1 Airway Equipment and Method: Stylet and Oral airway Placement Confirmation: ETT inserted through vocal cords under direct vision, positive ETCO2 and breath sounds checked- equal and bilateral Secured at: 23 cm Tube secured with: Tape Dental Injury: Teeth and Oropharynx as per pre-operative assessment  Difficulty Due To: Difficult Airway- due to dentition, Difficult Airway- due to reduced neck mobility and Difficulty was anticipated

## 2021-11-23 NOTE — Interval H&P Note (Signed)
History and Physical Interval Note:  11/23/2021 7:19 AM  James [name]  has presented today for surgery, with the diagnosis of [place].  The various methods of treatment have been discussed with the patient and family. After consideration of risks, benefits and other options for treatment, the patient has consented to  Procedure(s): OPEN COMPONENT SEPARATION AND MESH ABDOMINAL WALL RECONSTRUCTION FOR INCISIONAL HERNIA REPAIR (N/A) as a surgical intervention.  The patient's history has been reviewed, patient examined, no change in status, stable for surgery.  I have reviewed the patient's chart and labs.  Questions were answered to the patient's satisfaction.    I have re-reviewed the the patient's records, history, medications, and allergies.  I have re-examined the patient.  I again discussed intraoperative plans and goals of post-operative recovery.  The patient agrees to proceed.  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[place]) 11/02/2021   Incisional hernia 11/02/2021   History of diverticulitis 11/02/2021   Portal vein thrombosis 11/02/2021   Hypertension associated with diabetes ([place]) 10/17/2019   Hyperlipidemia associated with type 2 diabetes mellitus ([place]) 10/17/2019   Type 2 diabetes mellitus without complication, without long-term current use of insulin ([place]) 10/03/2019   Abnormal kidney function 10/01/2019   Morbid obesity ([place]) 10/01/2019   ETD (Eustachian tube dysfunction), left 08/26/2018   Allergy to pollen 06/12/2018   Acquired hypothyroidism 03/01/2018   Hypogonadism in male  08/31/2015   Obstructive sleep apnea 04/01/2015   Secondary polycythemia 05/27/2014   Basal cell carcinoma 05/25/2014   Organic impotence 05/25/2014    Past Medical History:  Diagnosis Date   Arthritis    Hx of exploratory laparotomy    enterectomy small intest. resection,celiotomy   Hyperlipidemia    Hypertension    Hypothyroidism    Pre-diabetes    Sleep apnea     Past Surgical History:  Procedure Laterality Date   BACK SURGERY     TONSILLECTOMY      Social History   Socioeconomic History   Marital status: Married    Spouse name: Not on file   Number of children: Not on file   Years of education: Not on file   Highest education level: Not on file  Occupational History   Not on file  Tobacco Use   Smoking status: Never   Smokeless tobacco: Never  Vaping Use   Vaping Use: Never used  Substance and Sexual Activity   Alcohol use: No   Drug use: No   Sexual activity: Yes    Birth control/protection: None  Other Topics Concern   Not on file  Social History Narrative   Not on file   Social Determinants of Health   Financial Resource Strain: Not on file  Food Insecurity: Not on file  Transportation Needs: Not on file  Physical Activity: Not on file  Stress: Not on file  Social Connections: Not on file  Intimate Partner Violence: Not on file    Family History  Problem Relation Age of Onset   COPD Mother    Alzheimer's disease Father     Medications Prior to Admission  Medication Sig Dispense Refill Last Dose   atorvastatin (LIPITOR)  40 MG tablet TAKE ONE (1) TABLET EACH DAY 90 tablet 0 11/22/2021 at 2100   blood glucose meter kit and supplies Dispense based on patient and insurance preference. Use up to four times daily as directed. (FOR ICD-10 E10.9, E11.9). 1 each 0 11/23/2021 at 0400   carvedilol (COREG) 3.125 MG tablet Take 1 tablet (3.125 mg total) by mouth 2 (two) times daily. 180 tablet 1 11/22/2021 at 2100   fenofibrate 160 MG tablet TAKE ONE (1)  TABLET EACH DAY 90 tablet 0 11/22/2021 at 2100   fondaparinux (ARIXTRA) 7.5 MG/0.6ML SOLN injection INJECT THE CONTENTS OF 1 SYRINGE (7.5 MG) UNDER THE SKIN DAILY 18 mL 0 11/18/2021 at 2100   levothyroxine (SYNTHROID) 50 MCG tablet TAKE ONE (1) TABLET EACH DAY 90 tablet 0 11/22/2021 at 2100   testosterone cypionate (DEPOTESTOSTERONE CYPIONATE) 200 MG/ML injection INJECT 0.5ML IM EVERY 7 DAYS 2 mL 0 11/18/2021   valsartan-hydrochlorothiazide (DIOVAN-HCT) 320-25 MG tablet TAKE ONE TABLET BY MOUTH EACH DAY FOR BLOOD PRESSURE 30 tablet 2 11/22/2021 at 2100    Current Facility-Administered Medications  Medication Dose Route Frequency Provider Last Rate Last Admin   acetaminophen (TYLENOL) tablet 1,000 mg  1,000 mg Oral Once Annye Asa, MD       bupivacaine liposome (EXPAREL) 1.3 % injection 266 mg  20 mL Infiltration Once [name], MD       ceFAZolin (ANCEF) IVPB 2g/100 mL premix  2 g Intravenous On Call to OR [name], MD       Chlorhexidine Gluconate Cloth 2 % PADS 6 each  6 each Topical Once [name], MD       And   Chlorhexidine Gluconate Cloth 2 % PADS 6 each  6 each Topical Once [name], MD       Derrill Memo ON 11/24/2021] feeding supplement (ENSURE PRE-SURGERY) liquid 296 mL  296 mL Oral Once [name], MD       feeding supplement (ENSURE PRE-SURGERY) liquid 592 mL  592 mL Oral Once [name], MD       lactated ringers infusion   Intravenous Continuous Audry Pili, MD 10 mL/hr at 11/23/21 0710 New Bag at 11/23/21 0710     Allergies  Allergen Reactions   Metformin And Related     Loose bloody stools    BP 129/81   Pulse 84   Temp 98 F (36.7 C) (Oral)   Resp 18   Ht _0  (1.854 m)   Wt 106.3 kg   SpO2 99%   BMI 30.92 kg/m   Labs: Results for orders placed or performed during the hospital encounter of 11/23/21 (from the past 48 hour(s))  Glucose, capillary     Status: Abnormal   Collection Time: 11/23/21  6:55 AM  Result Value Ref Range    Glucose-Capillary 105 (H) 70 - 99 mg/dL    Comment: Glucose reference range applies only to samples taken after fasting for at least 8 hours.   Comment 1 Notify RN    Comment 2 Document in Chart     Imaging / Studies: No results found.   Adin Hector, M.D., F.A.C.S. Gastrointestinal and Minimally Invasive Surgery [facility] [address] [place], [place] [address] [phone] Main / Paging  11/23/2021 7:19 AM    Adin Hector

## 2021-11-23 NOTE — H&P (Addendum)
REFERRING PHYSICIAN: Baruch Gouty, NP  Patient Care Team: Stacks, Loma Sender, MD as PCP - General (Family Medicine) Odis Hollingshead, MD (Hematology)  PROVIDER: Hollace Kinnier, MD  [facility] MRN: [id number] DOB: 1966/03/26 DATE OF ENCOUNTER: 10/04/2021  Subjective   Chief Complaint: Hernia   History of Present Illness: James [name] is a 55 y.o. male who is seen today as an office consultation at the request of Dr. Thayer Ohm for evaluation of Hernia .   55 year old gentleman with multiple medical problems. Type 2 diabetes, hypertension, sleep apnea on CPAP., hypercholesterolemia. History of diverticulitis. He has a history of prior venous thromboses. He had worsening abdominal pain June. He was diagnosed with sigmoid: Diverticulitis. Placed on antibiotics and discharged 3 days later. He returned a few days later with more severe different abdominal pain. Readmitted. However contrasted work-up raise suspicion of bowel ischemia and mesenteric thrombosis, so he was transferred to [facility] Required emergent abdominal surgery for removal of 55 cm of ischemic bowel. Left in discontinuity, anticoagulated, numerous thrombectomies done. Reanastomosed. He had evidence of venous thrombosis including the SMV, jugular veins, splenic vein, main portal vein and intrahepatic veins. Stay in the hospital for 10 days. Discharge 7/7. Followed up and had staples removed. Follow back up with his primary care office up in [facility].   Patient noted abdominal swelling and diagnosed with an incisional hernia. Patient wanted to have surgery as soon as possible so was shopping around for different surgery offices. Therefore, sent to me at [facility] for evaluation. Patient followed by Dr. Joan Flores with hematology. Plan was to switch from subcutaneous Lovenox to Arixtra 7.5 mg daily. Patient is supposed to have a follow-up with CT venogram and EGD according to them in a couple of weeks. I do not know if that set up.  There is concern of some cecal ischemia and prior diverticulitis but no colon was resected. Last colonoscopy 2018 by Dr. Anthony Sar at [facility] just showed a diverticulosis. He has not had a repeat colonoscopy since his diverticulitis attack earlier this year.  Patient notes the abdominal bulging is concerning and uncomfortable. He has some mild constipation treated with MiraLAX. No nausea or vomiting. He used to have diabetes but with the weight loss his sugars have been under control and he is on no medications for that right now. He does not smoke. He does use CPAP at night with a diagnosis of sleep apnea but denies much in the way of snoring. He is back to walking his 2-3 miles a day. Is eating well. He does not smoke.  Medical History: Past Medical History:  Diagnosis Date   DVT (deep venous thrombosis) (CMS-HCC)   Sleep apnea   There is no problem list on file for this patient.  Past Surgical History:  Procedure Laterality Date   COLON SURGERY    No Known Allergies  Current Outpatient Medications on File Prior to Visit  Medication Sig Dispense Refill   atorvastatin (LIPITOR) 40 MG tablet Take 1 tablet by mouth once daily   fenofibrate 160 MG tablet Take 1 tablet by mouth once daily   fondaparinux (ARIXTRA) 7.5 mg/0.6 mL injection   levothyroxine (SYNTHROID) 50 MCG tablet Take 1 tablet by mouth once daily   metoprolol succinate (TOPROL-XL) 100 MG XL tablet TAKE 1 TABLET ONCE DAILY. TAKE WITH OR IMMEDIATELY FOLLOWING A MEAL   testosterone cypionate (DEPO-TESTOSTERONE) 200 mg/mL injection INJECT 0.5ML IM EVERY 7 DAYS   valsartan-hydrochlorothiazide (DIOVAN-HCT) 320-25 mg tablet TAKE ONE TABLET BY  MOUTH EACH DAY FOR BLOOD PRESSURE   No current facility-administered medications on file prior to visit.   Family History  Problem Relation Age of Onset   Hyperlipidemia (Elevated cholesterol) Father   High blood pressure (Hypertension) Father    Social History   Tobacco Use   Smoking Status Never Smoker  Smokeless Tobacco Never Used    Social History   Socioeconomic History   Marital status: Married  Tobacco Use   Smoking status: Never Smoker   Smokeless tobacco: Never Used  Substance and Sexual Activity   Alcohol use: Not Currently   Drug use: Not Currently   ############################################################  Fraility Risk:  Preoperative Risks/Screening 1. Frailty Review:  Lives independently? yes Uses a mobility assist device (cane/walker/wheelchair)? no History of falls within 3 months? no Cognitive impairment/dementia? no Age > 65? no  2. Nutrition Screening: Cancer/IBD? no Age >65? no Weight loss >10% in past 6 months? yes If yes to any of the 3 above, consider Impact AR supplemental shake  3. PONV Screening: History of PONV? no Male under age of 52? yes History of motion sickness? yes  4. Chronic pain issues? no  5. Diabetes? yes Last HgbA1c: 8.4  Review of Systems: A complete review of systems (ROS) was obtained from the patient. I have reviewed this information and discussed as appropriate with the patient. See HPI as well for other pertinent ROS.  Constitutional: No fevers, chills, sweats. Weight stable Eyes: No vision changes, No discharge HENT: No sore throats, nasal drainage Lymph: No neck swelling, No bruising easily Pulmonary: No cough, productive sputum CV: No orthopnea, PND Patient walks 60 minutes for about 3 miles without difficulty. No exertional chest/neck/shoulder/arm pain.  GI: No personal nor family history of GI/colon cancer, inflammatory bowel disease, irritable bowel syndrome, allergy such as Celiac Sprue, dietary/dairy problems, colitis, ulcers nor gastritis. No recent sick contacts/gastroenteritis. No travel outside the country. No changes in diet.  Renal: No UTIs, No hematuria Genital: No drainage, bleeding, masses Musculoskeletal: No severe joint pain. Good ROM major joints Skin: No  sores or lesions Heme/Lymph: No easy bleeding. No swollen lymph nodes  Objective:   Vitals:  10/04/21 0835  BP: 118/78  Pulse: 105  Weight: (!) 101.7 kg (224 lb 3.2 oz)  Height: 185.4 cm (6\' 1" )    Body mass index is 29.58 kg/m.  PHYSICAL EXAM:  Constitutional: Not cachectic. Hygeine adequate. Vitals signs as above.  Eyes: Pupils reactive, normal extraocular movements. Sclera nonicteric Neuro: CN II-XII intact. No major focal sensory defects. No major motor deficits. Lymph: No head/neck/groin lymphadenopathy Psych: No severe agitation. No severe anxiety. Judgment & insight Adequate, Oriented x4, HENT: Normocephalic, Mucus membranes moist. No thrush.  Neck: Supple, No tracheal deviation. No obvious thyromegaly Chest: No pain to chest wall compression. Good respiratory excursion. No audible wheezing CV: Pulses intact. Regular rhythm. No major extremity edema  Abdomen: Obese Hernia: Large midline 20 x 7 cm periumbilical incisional hernia with bowing and diastases. Easily reducible. Sensitive. Rest the abdomen nontender.. Diastasis recti: Large supraumbilical midline. Soft. Nondistended. Nontender. No hepatomegaly. No splenomegaly  Gen: Inguinal hernia: Not present. Inguinal lymph nodes: without lymphadenopathy.   Rectal: (Deferred)  Ext: No obvious deformity or contracture. Edema: Not present. No cyanosis Skin: No major subcutaneous nodules. Warm and dry Musculoskeletal: Severe joint rigidity not present. No obvious clubbing. No digital petechiae.   Labs, Imaging and Diagnostic Testing:  Located in [place]' section of Epic EMR chart  PRIOR NOTES   Not applicable  SURGERY NOTES:  Located in [place]' section of Epic EMR chart  PATHOLOGY:  Located in [place]' section of Epic EMR chart  Assessment and Plan:  DIAGNOSES:  Diagnoses and all orders for this visit:  Incisional hernia, without obstruction or gangrene  Superior mesenteric  vein thrombosis (CMS-HCC)  Thrombosis mesenteric artery (CMS-HCC)  Ischemic necrosis of small bowel (CMS-HCC)  Non-insulin dependent type 2 diabetes mellitus (CMS-HCC)  OSA on CPAP    ASSESSMENT/PLAN  Pleasant patient with episodes of recurrent clotting episodes involving SMV and mesenteric vessels. First clotting episode 2 years ago. Second 1 severe enough to cause small bowel ischemia requiring emergent damage control small bowel resection of 55 cm with follow-up anastomosis and hospital stay at [facility]. Considering everything, he is got through that relatively well.  He does have a moderately large incisional hernia with significant diastases. I think this is to large and broad to try and do a laparoscopic repair. I think he would require open component separation with retrorectus tar release and mesh placement and closure. Would be a larger operation. However, he went through emergency surgery relatively well and has good exercise tolerance already, so I think his risks are not too severe.   The anatomy & physiology of the abdominal wall was discussed.  The pathophysiology of hernias was discussed.  Natural history risks without surgery including progeressive enlargement, pain, incarceration, & strangulation was discussed.   Contributors to complications such as smoking, obesity, diabetes, prior surgery, etc were discussed.   I feel the risks of no intervention will lead to serious problems that outweigh the operative risks; therefore, I recommended surgery to reduce and repair the hernia.  I explained laparoscopic techniques with possible need for an open approach.  I noted the probable use of mesh to patch and/or buttress the hernia repair  Risks such as bleeding, infection, abscess, need for further treatment, injury to other organs, need for repair of tissues / organs, stroke, heart attack, death, and other risks were discussed.  I noted a good likelihood this will help address  the problem.   Goals of post-operative recovery were discussed as well.  Possibility that this will not correct all symptoms was explained.  I stressed the importance of low-impact activity, aggressive pain control, avoiding constipation, & not pushing through pain to minimize risk of post-operative chronic pain or injury. Possibility of reherniation especially with smoking, obesity, diabetes, immunosuppression, and other health conditions was discussed.  We will work to minimize complications.     An educational handout further explaining the pathology & treatment options was given as well.  Questions were answered.  The patient expresses understanding & wishes to proceed with surgery.  Cleared by medicine.  Upper and lower endoscopy with some portal changes but no major varices or hemorrhoids.  No other surprises.  Hematology feels reasonable to hold oral anticoagulation.  Most likely resume postop day 1-2    Adin Hector, MD, FACS, MASCRS Esophageal, Gastrointestinal & Colorectal Surgery Robotic and Minimally Invasive Surgery  [facility], [place]  [place]. [address] [place], [place] [address] [phone] Fax [phone] Main  CONTACT INFORMATION:  Weekday (9AM-5PM): Call [facility] main office at [phone]  Weeknight (5PM-9AM) or Weekend/Holiday: Check [email or web address] (password " [email or web address]") for General Surgery [facility] coverage  (Please, do not use SecureChat as it is not reliable communication to operating surgeons for immediate patient care)

## 2021-11-23 NOTE — Op Note (Signed)
11/23/2021  12:46 PM  PATIENT:  James [name]  55 y.o. male  Patient Care Team: Claretta Fraise, MD as PCP - General (Family Medicine) Gareth Morgan, MD (Inactive) as Referring Physician (Hematology) Burke Keels, MD (Surgery) [name], MD as Consulting Physician (General Surgery)  PRE-OPERATIVE DIAGNOSIS:   VENTRAL INCISIONAL INCARCERATED ABDOMINAL WALL HERNIAE PORTAL HYPERTENSION  POST-OPERATIVE DIAGNOSIS:   VENTRAL INCISIONAL INCARCERATED ABDOMINAL WALL HERNIAE PORTAL HYPERTENSION  PROCEDURE:   OPEN COMPONENT SEPARATION - BILATERAL Transversus abdominis release (TAR) ABDOMINAL WALL RECONSTRUCTION FOR INCISIONAL HERNIA REPAIR WITH MESH OPEN LYSIS OF ADHESIONS X 90 MIN (1/2 CASE) TRANSVERSUS ABDOMINIS PLANE (TAP) BLOCK - BILATERAL  SURGEON:  Adin Hector, MD  ASSISTANT: Carlena Hurl, PA-C An experienced assistant was required given the standard of surgical care given the complexity of the case.  This assistant was needed for exposure, dissection, suction, tissue approximation, retraction, perception, etc.  ANESTHESIA:   general  [place] TRANSVERSUS ABDOMINIS PLANE (TAP) nerve block for perioperative & postoperative pain control provided with liposomal bupivacaine (Experel) mixed with 0.25% bupivacaine as a Bilateral TAP block x 8mL each side at the level of the transverse abdominis & preperitoneal spaces along the flank at the anterior axillary line, from subxiphoid & subcostal ridge to iliac crest under laparoscopic guidance    EBL:  Total I/O In: 2500 [I.V.:2000; IV Piggyback:500] Out: 1050 [Urine:275; Blood:775].  See anesthesia record  Delay start of Pharmacological VTE agent (>24hrs) due to surgical blood loss or risk of bleeding:  no  DRAINS:   Left upper quadrant 19 [place] Blake drain goes in the retrorectus space over the mesh  Right upper quadrant 15 [place] Blake drain rests in the subcutaneous tissues superficial to the anterior rectus primary fascial  closure  SPECIMEN: Hernia sac  DISPOSITION OF SPECIMEN:  PATHOLOGY  COUNTS:  YES  PLAN OF CARE: Admit to inpatient   PATIENT DISPOSITION:  PACU - hemodynamically stable.  INDICATION: Pleasant obese patient has developed a ventral wall abdominal hernia. Patient status post exploratory laparotomy resection of ischemic bowel at [facility] complicated by mesenteric thrombosis requiring anticoagulation.  Developed large incisional hernia.  Portal hypertension are better controlled.  Anticoagulated.  Cleared by medicine and hematology for surgery.  Increasing swelling and bulging from his large midline incisional hernia.  He request repair:   The anatomy & physiology of the abdominal wall was discussed. The pathophysiology of hernias was discussed. Natural history risks without surgery including progeressive enlargement, pain, incarceration & strangulation was discussed. Contributors to complications such as smoking, obesity, diabetes, prior surgery, etc were discussed.   I feel the risks of no intervention will lead to serious problems that outweigh the operative risks; therefore, I recommended surgery to reduce and repair the hernia. I explained laparoscopic techniques with possible need for an open approach. I noted the probable use of mesh to patch and/or buttress the hernia repair  Risks such as bleeding, infection, abscess, need for further treatment, heart attack, death, and other risks were discussed. I noted a good likelihood this will help address the problem. Goals of post-operative recovery were discussed as well. Possibility that this will not correct all symptoms was explained. I stressed the importance of low-impact activity, aggressive pain control, avoiding constipation, & not pushing through pain to minimize risk of post-operative chronic pain or injury. Possibility of reherniation especially with smoking, obesity, diabetes, immunosuppression, and other health conditions was  discussed. We will work to minimize complications.  An educational handout further explaining the pathology &  treatment options was given as well. Questions were answered. The patient expresses understanding & wishes to proceed with surgery.   OR FINDINGS:   Patient had a 20 x 7 cm mainly infraumbilical region of incisional hernia incarcerated with very dense adhesions of small bowel colon and greater omentum.  Interloop small bowel adhesions.  Type of repair - Component separation repair.  Bilateral.  Transversus abdominis release    Name of mesh - Marlex - standard weight polypropylene mesh   Size of mesh - Height 30 cm, Width 30 cm  Orientation:  Diamond (= 42 wide by 38 cm tall)   Mesh overlap - 7-10  cm  Placement of mesh - retrorectus underlay repair   DESCRIPTION:   Informed consent was confirmed. The patient underwent general anaesthesia without difficulty. The patient was positioned appropriately. VTE prevention in place. The patient's abdomen was clipped, prepped, & draped in a sterile fashion. Surgical timeout confirmed our plan.   Entry was gained through a midline incision through his primary midline incision.  Came through scar and subcutaneous tissue.  Eventually had to continue cephalad to the most superior scar in the subxiphoid region.  Carefully freed tissues out of the hernia sac to the subcutaneous tissue and dermis.  Eventually was able to come down to anterior rectus fascia which had moderate diastases and a large region of Swiss cheese hernias.  Greater omentum was densely incarcerated within this.  Eventually was able to get into the peritoneal cavity and gradually free these adhesions off.  Because of his portal hypertension venous bleeding was somewhat brisk at times.  Controlled with clamp and suture as well as LigaSure vessel sealer system.  Freed off some small bowel adhesions to the peritoneum as well.  I freed it from adhesions to the liver and falciform  ligament.   Freed the falciform ligament off the omentum as well.  We assured hemostasis.  Freed the subcutaneous tissue off the anterior fascia 5 cm laterally on either side.  Our fascia cannot primarily come together  Because the fascia would not come together, I then proceeded to do component separation.   A transversus abdominis release (TAR) was performed on the right side.  The transversus abdominis muscle was identified within the posterior rectus sheath and incised vertically along its entire length, entering the pre-peritoneal or pre-transversalis fascia plane.  The peritoneum was subsequently peeled away from the underside of the divided transversus abdominis muscle.  This dissection was carried out laterally towards the retroperitoneum and was also carried out lateral to the colon.  The TAR accomplished additional medialization of the posterior rectus sheath with its attached peritoneum towards the midline to allow for visceral sac closure.  The TAR also provided further offset of tension with advancement of the rectus muscle towards the midline, as it remained attached to the external and internal abdominal oblique muscles.  This provide some release but still there was significant tension in the midline.  Therefore I did a transversus abdominis release (TAR) on the left side as well in a similar fashion.  With this the posterior rectus fascia could come to midline in the anterior rectus fascia, the midline with much less tension closed some right upper quadrant retrorectus defect using interrupted Vicryl suture to good result.  I allowed the omentum to stay in the midline.  Brought the falciform ligament as well.  Trimmed off hernia sac until we came to posterior rectus fascia that was viable without tension.  Close that using 2-0  PDS suture in a running fashion.  Because there was no need for bowel resection injury or abscess, and ascites permanent mesh.  Chose a 30 cm sheet of polypropylene  standard weight Marlex mesh.  Plated as a diamond.  The inferior edge went over the bladder dome inferior to the pubis.  The superior edge came up to the xiphoid.  I trimmed that superior corner a little bit.  I allowed the lateral tails to come all the way around towards the posterior axillary line on both sides placed a 19 French drain through the left upper quadrant into this retrorectus space.  I then placed #1 PDS suture around the edges of the mesh x8.  Placed the mesh into the retrorectus space and brought the tails up in the subxiphoid subcostal and flank regions on both sides using a Endo Close suture passer to good result.  That helped secure the mesh laterally and help it laid well.  Sutures tied down.  I then trimmed the into rectus fashion to much more healthy viable tissue.  We primarily closed the anterior to's fascia using #1 PDS in a running fashion to good result.  He came together well without tension.  Tissue muscle looked viable.  Muscle came to the midline as well.  Placed a another drain into the deep subcutaneous space over the anterior rectus fascial closure.  Secured on the right upper quadrant also with a Prolene suture.  Although he had some laxity in his skin there was no major redundancy.  Decided to hold off on any aggressive skin resection at this time.  Umbilicus was reattached to the fascia using PDS.  Subcutaneous wound closed with interrupted 2-0 Vicryl deep dermal suture and running 4 Monocryl suture.  Puncture PDS sutures closed with Steri-Strips.  Sterile dressings were placed around both drains.  Honeycomb dressing placed on midline incision.  Abdominal binder in place.  Patient is being extubated to go to the recovery room.  I discussed operative findings, updated the patient's status, discussed probable steps to recovery, and gave postoperative recommendations to the patient's spouse, Brett Darko.   Recommendations were made.  Questions were answered.  She expressed  understanding & appreciation.  Adin Hector, MD, FACS, MASCRS Esophageal, Gastrointestinal & Colorectal Surgery Robotic and Minimally Invasive Surgery  [facility], [place]  [place]. [address] [place], [place] [address] [phone] Fax [phone] Main  CONTACT INFORMATION:  Weekday (9AM-5PM): Call [facility] main office at [phone]  Weeknight (5PM-9AM) or Weekend/Holiday: Check [email or web address] (password " [email or web address]") for General Surgery [facility] coverage  (Please, do not use SecureChat as it is not reliable communication to operating surgeons for immediate patient care)    11/23/2021    11/23/2021

## 2021-11-23 NOTE — Anesthesia Postprocedure Evaluation (Signed)
Anesthesia Post Note  Patient: Bart SUKHDEEP WIETING  Procedure(s) Performed: OPEN COMPONENT SEPARATION AND MESH ABDOMINAL WALL RECONSTRUCTION FOR INCISIONAL HERNIA REPAIR, LYSIS OF ADHESIONS AND TRANSABDOMINAL PLANE BLOCK (Abdomen)     Patient location during evaluation: PACU Anesthesia Type: General Level of consciousness: patient cooperative, oriented and sedated Pain management: pain level controlled Vital Signs Assessment: post-procedure vital signs reviewed and stable Respiratory status: spontaneous breathing, nonlabored ventilation and respiratory function stable Cardiovascular status: blood pressure returned to baseline and stable Postop Assessment: no apparent nausea or vomiting Anesthetic complications: no   No notable events documented.  Last Vitals:  Vitals:   11/23/21 1315 11/23/21 1330  BP: 129/68 123/64  Pulse: (!) 106 (!) 103  Resp: 18 17  Temp:    SpO2: 98% 100%    Last Pain:  Vitals:   11/23/21 1330  TempSrc:   PainSc: Asleep                 Hawa Henly,E. Zaydon Kinser

## 2021-11-23 NOTE — Discharge Instructions (Addendum)
Okay to resume home medications when she get home.  Use blood thinner injections into your thigh for the first week to avoid causing active bleeding in your abdomen where you had your hernia repair.  See injury care instructions below.  Follow-up with the [facility] surgery office next week to have the drains removed by a nurse  HERNIA REPAIR: POST OP INSTRUCTIONS  ######################################################################  EAT Gradually transition to a high fiber diet with a fiber supplement over the next few weeks after discharge.  Start with a pureed / full liquid diet (see below)  WALK Walk an hour a day.  Control your pain to do that.    CONTROL PAIN Control pain so that you can walk, sleep, tolerate sneezing/coughing, and go up/down stairs.  HAVE A BOWEL MOVEMENT DAILY Keep your bowels regular to avoid problems.  OK to try a laxative to override constipation.  OK to use an antidairrheal to slow down diarrhea.  Call if not better after 2 tries  CALL IF YOU HAVE PROBLEMS/CONCERNS Call if you are still struggling despite following these instructions. Call if you have concerns not answered by these instructions  ######################################################################    DIET: Follow a light bland diet & liquids the first 24 hours after arrival home, such as soup, liquids, starches, etc.  Be sure to drink plenty of fluids.  Quickly advance to a usual solid diet within a few days.  Avoid fast food or heavy meals as your are more likely to get nauseated or have irregular bowels.  A low-fat, high-fiber diet for the rest of your life is ideal.   Take your usually prescribed home medications unless otherwise directed.  PAIN CONTROL: Pain is best controlled by a usual combination of three different methods TOGETHER: Ice/Heat Over the counter pain medication Prescription pain medication Most patients will experience some swelling and bruising around the hernia(s)  such as the bellybutton, groins, or old incisions.  Ice packs or heating pads (30-60 minutes up to 6 times a day) will help. Use ice for the first few days to help decrease swelling and bruising, then switch to heat to help relax tight/sore spots and speed recovery.  Some people prefer to use ice alone, heat alone, alternating between ice & heat.  Experiment to what works for you.  Swelling and bruising can take several weeks to resolve.   It is helpful to take an over-the-counter pain medication regularly for the first few weeks.  Choose one of the following that works best for you: Naproxen (Aleve, etc)  Two 220mg  tabs twice a day Ibuprofen (Advil, etc) Three 200mg  tabs four times a day (every meal & bedtime) Acetaminophen (Tylenol, etc) 325-650mg  four times a day (every meal & bedtime) A  prescription for pain medication should be given to you upon discharge.  Take your pain medication as prescribed.  If you are having problems/concerns with the prescription medicine (does not control pain, nausea, vomiting, rash, itching, etc), please call us [phone] to see if we need to switch you to a different pain medicine that will work better for you and/or control your side effect better. If you need a refill on your pain medication, please contact your pharmacy.  They will contact our office to request authorization. Prescriptions will not be filled after 5 pm or on week-ends.  Avoid getting constipated.  Between the surgery and the pain medications, it is common to experience some constipation.  Increasing fluid intake and taking a fiber supplement (such as Metamucil,  Citrucel, FiberCon, MiraLax, etc) 1-2 times a day regularly will usually help prevent this problem from occurring.  A mild laxative (prune juice, Milk of Magnesia, MiraLax, etc) should be taken according to package directions if there are no bowel movements after 48 hours.    Wash / shower every day.  You may shower over the dressings as  they are waterproof.    Remove your waterproof bandages, skin tapes, and other bandages 3 days after surgery. You may replace a dressing/Band-Aid to cover the incision for comfort if you wish. You may leave the incisions open to air.  You may replace a dressing/Band-Aid to cover an incision for comfort if you wish.  Continue to shower over incision(s) after the dressing is off.  ACTIVITIES as tolerated:   You may resume regular (light) daily activities beginning the next day--such as daily self-care, walking, climbing stairs--gradually increasing activities as tolerated.  Control your pain so that you can walk an hour a day.  If you can walk 30 minutes without difficulty, it is safe to try more intense activity such as jogging, treadmill, bicycling, low-impact aerobics, swimming, etc. Save the most intensive and strenuous activity for last such as sit-ups, heavy lifting, contact sports, etc  Refrain from any heavy lifting or straining until you are off narcotics for pain control.   DO NOT PUSH THROUGH PAIN.  Let pain be your guide: If it hurts to do something, don't do it.  Pain is your body warning you to avoid that activity for another week until the pain goes down. You may drive when you are no longer taking prescription pain medication, you can comfortably wear a seatbelt, and you can safely maneuver your car and apply brakes. You may have sexual intercourse when it is comfortable.   FOLLOW UP in our office Please call [facility] at [phone] to set up an appointment to see your surgeon in the office for a follow-up appointment approximately 2-3 weeks after your surgery. Make sure that you call for this appointment the day you arrive home to insure a convenient appointment time.  9.  If you have disability of FMLA / Family leave forms, please bring the forms to the office for processing.  (do not give to your surgeon).  WHEN TO CALL us [phone]: Poor pain control Reactions / problems with  new medications (rash/itching, nausea, etc)  Fever over 101.5 F (38.5 C) Inability to urinate Nausea and/or vomiting Worsening swelling or bruising Continued bleeding from incision. Increased pain, redness, or drainage from the incision   The clinic staff is available to answer your questions during regular business hours (8:30am-5pm).  Please don't hesitate to call and ask to speak to one of our nurses for clinical concerns.   If you have a medical emergency, go to the nearest emergency room or call 911.  A surgeon from [facility] is always on call at the hospitals in [facility], [place], [place], [place], [place]  [address] ?  [address] [address], [place], [place]   [address] MAIN: [phone] ? TOLL FREE: [phone] ? FAX: [phone] [email or web address]

## 2021-11-23 NOTE — Transfer of Care (Signed)
Immediate Anesthesia Transfer of Care Note  Patient: James [name]  Procedure(s) Performed: OPEN COMPONENT SEPARATION AND MESH ABDOMINAL WALL RECONSTRUCTION FOR INCISIONAL HERNIA REPAIR, LYSIS OF ADHESIONS AND TRANSABDOMINAL PLANE BLOCK (Abdomen)  Patient Location: PACU  Anesthesia Type:General  Level of Consciousness: drowsy  Airway & Oxygen Therapy: Patient Spontanous Breathing and Patient connected to face mask oxygen  Post-op Assessment: Report given to RN and Post -op Vital signs reviewed and stable  Post vital signs: Reviewed and stable  Last Vitals:  Vitals Value Taken Time  BP 127/67 11/23/21 1247  Temp    Pulse 104 11/23/21 1250  Resp 11 11/23/21 1250  SpO2 100 % 11/23/21 1250  Vitals shown include unvalidated device data.  Last Pain:  Vitals:   11/23/21 0708  TempSrc:   PainSc: 0-No pain         Complications: No notable events documented.

## 2021-11-24 ENCOUNTER — Encounter (HOSPITAL_COMMUNITY): Payer: Self-pay | Admitting: Surgery

## 2021-11-24 DIAGNOSIS — K430 Incisional hernia with obstruction, without gangrene: Secondary | ICD-10-CM | POA: Diagnosis not present

## 2021-11-24 LAB — CBC
HCT: 27.8 % — ABNORMAL LOW (ref 39.0–52.0)
Hemoglobin: 8.6 g/dL — ABNORMAL LOW (ref 13.0–17.0)
MCH: 24.9 pg — ABNORMAL LOW (ref 26.0–34.0)
MCHC: 30.9 g/dL (ref 30.0–36.0)
MCV: 80.6 fL (ref 80.0–100.0)
Platelets: 128 10*3/uL — ABNORMAL LOW (ref 150–400)
RBC: 3.45 MIL/uL — ABNORMAL LOW (ref 4.22–5.81)
RDW: 17.6 % — ABNORMAL HIGH (ref 11.5–15.5)
WBC: 11.1 10*3/uL — ABNORMAL HIGH (ref 4.0–10.5)
nRBC: 0 % (ref 0.0–0.2)

## 2021-11-24 LAB — BASIC METABOLIC PANEL
Anion gap: 6 (ref 5–15)
BUN: 19 mg/dL (ref 6–20)
CO2: 24 mmol/L (ref 22–32)
Calcium: 8 mg/dL — ABNORMAL LOW (ref 8.9–10.3)
Chloride: 99 mmol/L (ref 98–111)
Creatinine, Ser: 1.64 mg/dL — ABNORMAL HIGH (ref 0.61–1.24)
GFR, Estimated: 49 mL/min — ABNORMAL LOW (ref >60)
Glucose, Bld: 113 mg/dL — ABNORMAL HIGH (ref 70–99)
Potassium: 3.8 mmol/L (ref 3.5–5.1)
Sodium: 129 mmol/L — ABNORMAL LOW (ref 135–145)

## 2021-11-24 LAB — MAGNESIUM: Magnesium: 1.7 mg/dL (ref 1.7–2.4)

## 2021-11-24 LAB — SURGICAL PATHOLOGY

## 2021-11-24 MED ORDER — SODIUM CHLORIDE 0.9% FLUSH
3.0000 mL | Freq: Two times a day (BID) | INTRAVENOUS | Status: DC
  Administered 2021-11-24: 3 mL via INTRAVENOUS

## 2021-11-24 MED ORDER — SODIUM CHLORIDE 0.9 % IV SOLN: 250.0000 mL | INTRAVENOUS | Status: DC | PRN

## 2021-11-24 MED ORDER — SODIUM CHLORIDE 0.9% FLUSH: 3.0000 mL | INTRAVENOUS | Status: DC | PRN

## 2021-11-24 NOTE — Progress Notes (Signed)
DC packet given to pt. DC instructions given to pt. All questions answered. Pt instructed on drain care by teach back method. Pt demonstrates understanding. Ivs removed.

## 2021-11-24 NOTE — Progress Notes (Signed)
James [name] [id number] 04-08-1966  CARE TEAM:  PCP: Claretta Fraise, MD  Outpatient Care Team: Patient Care Team: Claretta Fraise, MD as PCP - General (Family Medicine) Gareth Morgan, MD (Inactive) as Referring Physician (Hematology) Burke Keels, MD (Surgery) [name], MD as Consulting Physician (General Surgery)  Inpatient Treatment Team: Treatment Team: Attending Provider: [name], MD; Technician: Donney Dice, NT; Charge Nurse: Arminda Resides, RN; Technician: Rutherford Nail, NT; Pharmacist: Eudelia Bunch, [facility]; Registered Nurse: Kai Levins, RN   Problem List:   Principal Problem:   Incisional hernia Active Problems:   History of diverticulitis   Portal hypertensive gastropathy ([place])   1 Day Post-Op  11/23/2021  POST-OPERATIVE DIAGNOSIS:   VENTRAL INCISIONAL INCARCERATED ABDOMINAL WALL HERNIAE PORTAL HYPERTENSION   PROCEDURE:   OPEN COMPONENT SEPARATION - BILATERAL Transversus abdominis release (TAR) ABDOMINAL WALL RECONSTRUCTION FOR INCISIONAL HERNIA REPAIR WITH MESH OPEN LYSIS OF ADHESIONS X 90 MIN (1/2 CASE) TRANSVERSUS ABDOMINIS PLANE (TAP) BLOCK - BILATERAL   SURGEON:  Adin Hector, MD    OR FINDINGS:    Patient had a 20 x 7 cm mainly infraumbilical region of incisional hernia incarcerated with very dense adhesions of small bowel colon and greater omentum.  Interloop small bowel adhesions.   Type of repair - Component separation repair.  Bilateral.  Transversus abdominis release               Name of mesh - Marlex - standard weight polypropylene mesh    Size of mesh - Height 30 cm, Width 30 cm   Orientation:  Diamond (= 42 wide by 38 cm tall)    Mesh overlap - 7-10  cm   Placement of mesh - retrorectus underlay repair  Assessment  Recovering well so far  [facility] Stay = 0 days)  Plan:  -ERAS protocol.  Abrupt stop IV fluids.  Above DC Foley.  Advance diet.  Low-dose blood thinners first 48 hours.   Subcutaneous Johnson thigh and on abdominal wall.  Drain care and training.  -VTE prophylaxis- SCDs, etc  -mobilize as tolerated to help recovery  Most likely discharge home later today if doing  Disposition:  Disposition:  The patient is from: Home  Anticipate discharge to:  Home  Anticipated Date of Discharge is:  December 8,2022    Barriers to discharge:  Pending Clinical improvement (more likely than not)  Patient currently is  close  for discharge from the hospital from a surgery standpoint.      25 minutes spent in review, evaluation, examination, counseling, and coordination of care.   I have reviewed this patient's available data, including medical history, events of note, physical examination and test results as part of my evaluation.  A significant portion of that time was spent in counseling.  Care during the described time interval was provided by me.  11/24/2021    Subjective: (Chief complaint)  Patient in bed smiling.  Denies much soreness.  Medications working. Tolerating liquids.  Drainage output not too severe.  Objective:  Vital signs:  Vitals:   11/23/21 2106 11/24/21 0155 11/24/21 0307 11/24/21 0635  BP: (!) 95/56 (!) 84/50 96/60 99/61   Pulse: 97 94 97 94  Resp: 18 18  18   Temp: 98.2 F (36.8 C)   98.1 F (36.7 C)  TempSrc: Oral   Oral  SpO2: 97% 97% 96% 97%  Weight:      Height:        Last BM Date: 11/22/21  Intake/Output   Yesterday:  12/07 0701 - 12/08 0700 In: 5994.1 [P.O.:1240; I.V.:3154.1; IV Piggyback:1600] Out: 2700 [TKZSW:1093; Drains:450; Blood:775] This shift:  No intake/output data recorded.  Bowel function:  Flatus: YES  BM:  No  Drain: Serosanguinous   Physical Exam:  General: Pt awake/alert in no acute distress Eyes: PERRL, normal EOM.  Sclera clear.  No icterus Neuro: CN II-XII intact w/o focal sensory/motor deficits. Lymph: No head/neck/groin lymphadenopathy Psych:  No delerium/psychosis/paranoia.   Oriented x 4 HENT: Normocephalic, Mucus membranes moist.  No thrush Neck: Supple, No tracheal deviation.  No obvious thyromegaly Chest: No pain to chest wall compression.  Good respiratory excursion.  No audible wheezing CV:  Pulses intact.  Regular rhythm.  No major extremity edema MS: Normal AROM mjr joints.  No obvious deformity  Abdomen: Soft.  Nondistended.  Mildly tender at incisions only.  No evidence of peritonitis.  No incarcerated hernias.  Ext:   No deformity.  No mjr edema.  No cyanosis Skin: No petechiae / purpurea.  No major sores.  Warm and dry    Results:   Cultures: Recent Results (from the past 720 hour(s))  SARS Coronavirus 2 (TAT 6-24 hrs)     Status: None   Collection Time: 11/21/21 12:00 AM  Result Value Ref Range Status   SARS Coronavirus 2 RESULT: NEGATIVE  Final    Comment: RESULT: NEGATIVESARS-CoV-2 INTERPRETATION:A NEGATIVE  test result means that SARS-CoV-2 RNA was not present in the specimen above the limit of detection of this test. This does not preclude a possible SARS-CoV-2 infection and should not be used as the  sole basis for patient management decisions. Negative results must be combined with clinical observations, patient history, and epidemiological information. Optimum specimen types and timing for peak viral levels during infections caused by SARS-CoV-2  have not been determined. Collection of multiple specimens or types of specimens may be necessary to detect virus. Improper specimen collection and handling, sequence variability under primers/probes, or organism present below the limit of detection may  lead to false negative results. Positive and negative predictive values of testing are highly dependent on prevalence. False negative test results are more likely when prevalence of disease is high.The expected result is NEGATIVE.Fact S heet for  Healthcare Providers: LocalChronicle.no Sheet for Patients:  SalonLookup.es Reference Range - Negative     Labs: Results for orders placed or performed during the hospital encounter of 11/23/21 (from the past 48 hour(s))  Glucose, capillary     Status: Abnormal   Collection Time: 11/23/21  6:55 AM  Result Value Ref Range   Glucose-Capillary 105 (H) 70 - 99 mg/dL    Comment: Glucose reference range applies only to samples taken after fasting for at least 8 hours.   Comment 1 Notify RN    Comment 2 Document in Chart   I-STAT, chem 8     Status: Abnormal   Collection Time: 11/23/21 11:41 AM  Result Value Ref Range   Sodium 135 135 - 145 mmol/L   Potassium 3.8 3.5 - 5.1 mmol/L   Chloride 100 98 - 111 mmol/L   BUN 8 6 - 20 mg/dL   Creatinine, Ser 1.10 0.61 - 1.24 mg/dL   Glucose, Bld 140 (H) 70 - 99 mg/dL    Comment: Glucose reference range applies only to samples taken after fasting for at least 8 hours.   Calcium, Ion 1.20 1.15 - 1.40 mmol/L   TCO2 22 22 - 32 mmol/L   Hemoglobin 9.2 (L) 13.0 -  17.0 g/dL   HCT 27.0 (L) 39.0 - 52.0 %  Glucose, capillary     Status: Abnormal   Collection Time: 11/23/21 12:54 PM  Result Value Ref Range   Glucose-Capillary 163 (H) 70 - 99 mg/dL    Comment: Glucose reference range applies only to samples taken after fasting for at least 8 hours.   Comment 1 Notify RN   Basic metabolic panel     Status: Abnormal   Collection Time: 11/24/21  4:38 AM  Result Value Ref Range   Sodium 129 (L) 135 - 145 mmol/L   Potassium 3.8 3.5 - 5.1 mmol/L   Chloride 99 98 - 111 mmol/L   CO2 24 22 - 32 mmol/L   Glucose, Bld 113 (H) 70 - 99 mg/dL    Comment: Glucose reference range applies only to samples taken after fasting for at least 8 hours.   BUN 19 6 - 20 mg/dL   Creatinine, Ser 1.64 (H) 0.61 - 1.24 mg/dL   Calcium 8.0 (L) 8.9 - 10.3 mg/dL   GFR, Estimated 49 (L) >60 mL/min    Comment: (NOTE) Calculated using the CKD-EPI Creatinine Equation (2021)    Anion gap 6 5 - 15    Comment:  Performed at [facility], [place] [address] [place], [place] [address]  Magnesium     Status: None   Collection Time: 11/24/21  4:38 AM  Result Value Ref Range   Magnesium 1.7 1.7 - 2.4 mg/dL    Comment: Performed at [facility], [place] [address], [place], [place] [address]  CBC     Status: Abnormal   Collection Time: 11/24/21  4:38 AM  Result Value Ref Range   WBC 11.1 (H) 4.0 - 10.5 K/uL   RBC 3.45 (L) 4.22 - 5.81 MIL/uL   Hemoglobin 8.6 (L) 13.0 - 17.0 g/dL   HCT 27.8 (L) 39.0 - 52.0 %   MCV 80.6 80.0 - 100.0 fL   MCH 24.9 (L) 26.0 - 34.0 pg   MCHC 30.9 30.0 - 36.0 g/dL   RDW 17.6 (H) 11.5 - 15.5 %   Platelets 128 (L) 150 - 400 K/uL    Comment: REPEATED TO VERIFY   nRBC 0.0 0.0 - 0.2 %    Comment: Performed at [facility], [place] [address] [place], [place] [address]    Imaging / Studies: No results found.  Medications / Allergies: per chart  Antibiotics: Anti-infectives (From admission, onward)    Start     Dose/Rate Route Frequency Ordered Stop   11/23/21 0630  ceFAZolin (ANCEF) IVPB 2g/100 mL premix        2 g 200 mL/hr over 30 Minutes Intravenous On call to O.R. 11/23/21 0615 11/23/21 0850         Note: Portions of this report may have been transcribed using voice recognition software. Every effort was made to ensure accuracy; however, inadvertent computerized transcription errors may be present.   Any transcriptional errors that result from this process are unintentional.    Adin Hector, MD, FACS, MASCRS Esophageal, Gastrointestinal & Colorectal Surgery Robotic and Minimally Invasive Surgery  [facility], [place]  [place]. [address] [place], [place] [address] [phone] Fax [phone] Main  CONTACT INFORMATION:  Weekday (9AM-5PM): Call [facility] main office at [phone]  Weeknight (5PM-9AM) or Weekend/Holiday: Check  [email or web address] (password " [email or web address]") for General Surgery [facility] coverage  (Please, do not use SecureChat  as it is not reliable communication to operating surgeons for immediate patient care)      11/24/2021  8:03 AM

## 2021-11-25 ENCOUNTER — Encounter (HOSPITAL_COMMUNITY): Payer: Self-pay | Admitting: Gastroenterology

## 2021-11-25 NOTE — Discharge Summary (Signed)
Physician Discharge Summary    Patient ID: James [name] MRN: [id number] DOB/AGE: 09/04/1966  55 y.o.  Patient Care Team: Claretta Fraise, MD as PCP - General (Family Medicine) Gareth Morgan, MD (Inactive) as Referring Physician (Hematology) Burke Keels, MD (Surgery) [name], MD as Consulting Physician (General Surgery)  Admit date: 11/23/2021  Discharge date: 11/24/2021 Hospital Stay = 0 days    Discharge Diagnoses:  Principal Problem:   Incisional hernia Active Problems:   History of diverticulitis   Portal hypertensive gastropathy ([place])   2 Days Post-Op  11/23/2021  POST-OPERATIVE DIAGNOSIS:   VENTRAL INCISIONAL INCARCERATED ABDOMINAL WALL HERNIAE PORTAL HYPERTENSION   PROCEDURE:   OPEN COMPONENT SEPARATION - BILATERAL Transversus abdominis release (TAR) ABDOMINAL WALL RECONSTRUCTION FOR INCISIONAL HERNIA REPAIR WITH MESH OPEN LYSIS OF ADHESIONS X 90 MIN (1/2 CASE) TRANSVERSUS ABDOMINIS PLANE (TAP) BLOCK - BILATERAL   SURGEON:  Adin Hector, MD   DRAINS:    Left upper quadrant 19 French Blake drain goes in the retrorectus space over the mesh   Right upper quadrant 15 [place] Blake drain rests in the subcutaneous tissues superficial to the anterior rectus primary fascial closure   OR FINDINGS:    Patient had a 20 x 7 cm mainly infraumbilical region of incisional hernia incarcerated with very dense adhesions of small bowel colon and greater omentum.  Interloop small bowel adhesions.   Type of repair - Component separation repair.  Bilateral.  Transversus abdominis release               Name of mesh - Marlex - standard weight polypropylene mesh    Size of mesh - Height 30 cm, Width 30 cm   Orientation:  Diamond (= 42 wide by 38 cm tall)    Mesh overlap - 7-10  cm   Placement of mesh - retrorectus underlay repair  Consults: None  Hospital Course:   The patient underwent the surgery above.  Postoperatively, the patient gradually mobilized and  advanced to a solid diet.  Pain and other symptoms were treated aggressively.    By the time of discharge, the patient was walking well the hallways, eating food, having flatus.  Pain was well-controlled on an oral medications.  Based on meeting discharge criteria and continuing to recover, I felt it was safe for the patient to be discharged from the hospital to further recover with close followup. Postoperative recommendations were discussed in detail.  They are written as well.  Discharged Condition: good  Discharge Exam: Blood pressure 106/62, pulse 96, temperature 97.7 F (36.5 C), temperature source Oral, resp. rate 15, height '6\' 1"'  (1.854 m), weight 106.3 kg, SpO2 98 %.  General: Pt awake/alert/oriented x4 in No acute distress Eyes: PERRL, normal EOM.  Sclera clear.  No icterus Neuro: CN II-XII intact w/o focal sensory/motor deficits. Lymph: No head/neck/groin lymphadenopathy Psych:  No delerium/psychosis/paranoia HENT: Normocephalic, Mucus membranes moist.  No thrush Neck: Supple, No tracheal deviation Chest: No chest wall pain w good excursion CV:  Pulses intact.  Regular rhythm MS: Normal AROM mjr joints.  No obvious deformity Abdomen: Soft.  Nondistended.  Mildly tender at incisions only.  No evidence of peritonitis.  No incarcerated hernias. Ext:  SCDs BLE.  No mjr edema.  No cyanosis Skin: No petechiae / purpura   Disposition:    Follow-up Information     [name], MD Follow up in 3 week(s).   Specialties: General Surgery, Colon and Rectal Surgery Why: To follow up after your operation Contact  information: [address] [place] [address] [phone]         [facility] [place] Follow up in 1 week(s).   Specialty: General Surgery Why: Call office to have surgery drains removed & incisions re-checked next week after surgery Contact information: [address] [place] [phone]                 Discharge disposition: 01-Home or Self Care       Discharge Instructions     Call MD for:   Complete by: As directed    FEVER > 101.5 F  (temperatures < 101.5 F are not significant)   Call MD for:  extreme fatigue   Complete by: As directed    Call MD for:  persistant dizziness or light-headedness   Complete by: As directed    Call MD for:  persistant nausea and vomiting   Complete by: As directed    Call MD for:  redness, tenderness, or signs of infection (pain, swelling, redness, odor or green/yellow discharge around incision site)   Complete by: As directed    Call MD for:  severe uncontrolled pain   Complete by: As directed    Diet - low sodium heart healthy   Complete by: As directed    Start with a bland diet such as soups, liquids, starchy foods, low fat foods, etc. the first few days at home. Gradually advance to a solid, low-fat, high fiber diet by the end of the first week at home.   Add a fiber supplement to your diet (Metamucil, etc) If you feel full, bloated, or constipated, stay on a full liquid or pureed/blenderized diet for a few days until you feel better and are no longer constipated.   Discharge instructions   Complete by: As directed    See Discharge Instructions If you are not getting better after two weeks or are noticing you are getting worse, contact our office [phone] for further advice.  We may need to adjust your medications, re-evaluate you in the office, send you to the emergency room, or see what other things we can do to help. The clinic staff is available to answer your questions during regular business hours (8:30am-5pm).  Please don't hesitate to call and ask to speak to one of our nurses for clinical concerns.    A surgeon from [facility] is always on call at the hospitals 24 hours/day If you have a medical emergency, go to the nearest emergency room or call 911.   Discharge wound care:   Complete by: As  directed    DRAIN:  You have a drain in place.  Every day change the dressing in the shower, wash around the skin exit site with soap & water and place a new dressing of gauze or band aid around the skin every day.  Keep the drain site clean & dry.  Follow up in our surgery office to discuss plans for drain removal in the near future  TEGADERM & STERISTRIPS:  You have clear gauze band-aid dressings over your closed incision(s).  You also have skin tapes called Steristrips on your incisions.  Leave these in place.  You may trim any edges that curl up with clean scissors.  You may remove all dressings & tapes in the shower in a week.   Driving Restrictions   Complete by: As directed    You may drive when: - you  are no longer taking narcotic prescription pain medication - you can comfortably wear a seatbelt - you can safely make sudden turns/stops without pain.   Increase activity slowly   Complete by: As directed    Start light daily activities --- self-care, walking, climbing stairs- beginning the day after surgery.  Gradually increase activities as tolerated.  Control your pain to be active.  Stop when you are tired.  Ideally, walk several times a day, eventually an hour a day.   Most people are back to most day-to-day activities in a few weeks.  It takes 4-6 weeks to get back to unrestricted, intense activity. If you can walk 30 minutes without difficulty, it is safe to try more intense activity such as jogging, treadmill, bicycling, low-impact aerobics, swimming, etc. Save the most intensive and strenuous activity for last (Usually 4-8 weeks after surgery) such as sit-ups, heavy lifting, contact sports, etc.  Refrain from any intense heavy lifting or straining until you are off narcotics for pain control.  You will have off days, but things should improve week-by-week. DO NOT PUSH THROUGH PAIN.  Let pain be your guide: If it hurts to do something, don't do it.   Lifting restrictions   Complete  by: As directed    If you can walk 30 minutes without difficulty, it is safe to try more intense activity such as jogging, treadmill, bicycling, low-impact aerobics, swimming, etc. Save the most intensive and strenuous activity for last (Usually 4-8 weeks after surgery) such as sit-ups, heavy lifting, contact sports, etc.   Refrain from any intense heavy lifting or straining until you are off narcotics for pain control.  You will have off days, but things should improve week-by-week. DO NOT PUSH THROUGH PAIN.  Let pain be your guide: If it hurts to do something, don't do it.  Pain is your body warning you to avoid that activity for another week until the pain goes down.   May shower / Bathe   Complete by: As directed    May walk up steps   Complete by: As directed    Remove dressing in 72 hours   Complete by: As directed    Make sure all dressings are removed by the third day after surgery.  Leave incisions open to air.  OK to cover incisions with gauze or bandages as desired   Sexual Activity Restrictions   Complete by: As directed    You may have sexual intercourse when it is comfortable. If it hurts to do something, stop.       Allergies as of 11/24/2021       Reactions   Metformin And Related    Loose bloody stools        Medication List     TAKE these medications    atorvastatin 40 MG tablet Commonly known as: LIPITOR TAKE ONE (1) TABLET EACH DAY   blood glucose meter kit and supplies Dispense based on patient and insurance preference. Use up to four times daily as directed. (FOR ICD-10 E10.9, E11.9).   carvedilol 3.125 MG tablet Commonly known as: Coreg Take 1 tablet (3.125 mg total) by mouth 2 (two) times daily.   fenofibrate 160 MG tablet TAKE ONE (1) TABLET EACH DAY   fondaparinux 7.5 MG/0.6ML Soln injection Commonly known as: ARIXTRA INJECT THE CONTENTS OF 1 SYRINGE (7.5 MG) UNDER THE SKIN DAILY   levothyroxine 50 MCG tablet Commonly known as:  SYNTHROID TAKE ONE (1) TABLET EACH DAY   methocarbamol 750  MG tablet Commonly known as: ROBAXIN Take 1 tablet (750 mg total) by mouth 4 (four) times daily as needed (use for muscle cramps/pain).   oxyCODONE 5 MG immediate release tablet Commonly known as: Oxy IR/ROXICODONE Take 1-2 tablets (5-10 mg total) by mouth every 6 (six) hours as needed for moderate pain, severe pain or breakthrough pain.   testosterone cypionate 200 MG/ML injection Commonly known as: DEPOTESTOSTERONE CYPIONATE INJECT 0.5ML IM EVERY 7 DAYS   valsartan-hydrochlorothiazide 320-25 MG tablet Commonly known as: DIOVAN-HCT TAKE ONE TABLET BY MOUTH EACH DAY FOR BLOOD PRESSURE               Discharge Care Instructions  (From admission, onward)           Start     Ordered   11/24/21 0000  Discharge wound care:       Comments: DRAIN:  You have a drain in place.  Every day change the dressing in the shower, wash around the skin exit site with soap & water and place a new dressing of gauze or band aid around the skin every day.  Keep the drain site clean & dry.  Follow up in our surgery office to discuss plans for drain removal in the near future  TEGADERM & STERISTRIPS:  You have clear gauze band-aid dressings over your closed incision(s).  You also have skin tapes called Steristrips on your incisions.  Leave these in place.  You may trim any edges that curl up with clean scissors.  You may remove all dressings & tapes in the shower in a week.   11/24/21 0807            Significant Diagnostic Studies:  Results for orders placed or performed during the hospital encounter of 11/23/21 (from the past 72 hour(s))  Glucose, capillary     Status: Abnormal   Collection Time: 11/23/21  6:55 AM  Result Value Ref Range   Glucose-Capillary 105 (H) 70 - 99 mg/dL    Comment: Glucose reference range applies only to samples taken after fasting for at least 8 hours.   Comment 1 Notify RN    Comment 2 Document in  Chart   I-STAT, chem 8     Status: Abnormal   Collection Time: 11/23/21 11:41 AM  Result Value Ref Range   Sodium 135 135 - 145 mmol/L   Potassium 3.8 3.5 - 5.1 mmol/L   Chloride 100 98 - 111 mmol/L   BUN 8 6 - 20 mg/dL   Creatinine, Ser 1.10 0.61 - 1.24 mg/dL   Glucose, Bld 140 (H) 70 - 99 mg/dL    Comment: Glucose reference range applies only to samples taken after fasting for at least 8 hours.   Calcium, Ion 1.20 1.15 - 1.40 mmol/L   TCO2 22 22 - 32 mmol/L   Hemoglobin 9.2 (L) 13.0 - 17.0 g/dL   HCT 27.0 (L) 39.0 - 52.0 %  Surgical pathology     Status: None   Collection Time: 11/23/21 11:43 AM  Result Value Ref Range   SURGICAL PATHOLOGY      SURGICAL PATHOLOGY CASE: [id number] PATIENT: Corinna Gab Surgical Pathology Report     Clinical History: Ventral incisional incarcerated abdominal wall hernia ([facility])     FINAL MICROSCOPIC DIAGNOSIS:  A. HERNIA SAC, ABDOMINAL, EXCISION: Hernia sac. Fibrous scarring of the adjacent fascia with foreign body type granulomas. Negative for acute inflammation and neoplasm.   Eberardo Demello DESCRIPTION:  A. Received in formalin labeled with the patients  name and "Abdominal hernia sac" is an 8.5 x 8.1 x 3.7 cm aggregate of red, rubbery, opaque fibromembranous tissue.  Representative sections are submitted in a single cassette.  (LEF 11/23/2021)   Final Diagnosis performed by Unknown Jim, MD.   Electronically signed 11/24/2021 Technical component performed at [facility], [place] [address] [place], [place] [address].  Professional component performed at [employer]. [facility], [place] [address] [place], [place] [address].  Immunohistochemi stry Technical component (if applicable) was performed at [facility]. [address] [place], [place], [place] [address].   IMMUNOHISTOCHEMISTRY DISCLAIMER (if applicable): Some of these immunohistochemical stains may have been developed and  the performance characteristics determine by [facility]. Some may not have been cleared or approved by the U.S. Food and Drug Administration. The FDA has determined that such clearance or approval is not necessary. This test is used for clinical purposes. It should not be regarded as investigational or for research. This laboratory is certified under the [place] (CLIA-88) as qualified to perform high complexity clinical laboratory testing.  The controls stained appropriately.   Glucose, capillary     Status: Abnormal   Collection Time: 11/23/21 12:54 PM  Result Value Ref Range   Glucose-Capillary 163 (H) 70 - 99 mg/dL    Comment: Glucose reference range applies only to samples taken after fasting for at least 8 hours.   Comment 1 Notify RN   Basic metabolic panel     Status: Abnormal   Collection Time: 11/24/21  4:38 AM  Result Value Ref Range   Sodium 129 (L) 135 - 145 mmol/L   Potassium 3.8 3.5 - 5.1 mmol/L   Chloride 99 98 - 111 mmol/L   CO2 24 22 - 32 mmol/L   Glucose, Bld 113 (H) 70 - 99 mg/dL    Comment: Glucose reference range applies only to samples taken after fasting for at least 8 hours.   BUN 19 6 - 20 mg/dL   Creatinine, Ser 1.64 (H) 0.61 - 1.24 mg/dL   Calcium 8.0 (L) 8.9 - 10.3 mg/dL   GFR, Estimated 49 (L) >60 mL/min    Comment: (NOTE) Calculated using the CKD-EPI Creatinine Equation (2021)    Anion gap 6 5 - 15    Comment: Performed at [facility], [place] [address] [place], [place] [address]  Magnesium     Status: None   Collection Time: 11/24/21  4:38 AM  Result Value Ref Range   Magnesium 1.7 1.7 - 2.4 mg/dL    Comment: Performed at [facility] [place] [address] [place], [place] [address]  CBC     Status: Abnormal   Collection Time: 11/24/21  4:38 AM  Result Value Ref Range   WBC 11.1 (H) 4.0 - 10.5 K/uL   RBC 3.45 (L) 4.22 - 5.81 MIL/uL   Hemoglobin 8.6 (L)  13.0 - 17.0 g/dL   HCT 27.8 (L) 39.0 - 52.0 %   MCV 80.6 80.0 - 100.0 fL   MCH 24.9 (L) 26.0 - 34.0 pg   MCHC 30.9 30.0 - 36.0 g/dL   RDW 17.6 (H) 11.5 - 15.5 %   Platelets 128 (L) 150 - 400 K/uL    Comment: REPEATED TO VERIFY   nRBC 0.0 0.0 - 0.2 %    Comment: Performed at [facility], [place] [address], [place], [place] [address]    No results found.  Past Medical History:  Diagnosis Date   Arthritis    Hx of exploratory laparotomy    enterectomy small intest. resection,celiotomy   Hyperlipidemia    Hypertension    Hypothyroidism    Pre-diabetes    Sleep apnea     Past Surgical History:  Procedure Laterality Date   BACK SURGERY     INCISIONAL HERNIA REPAIR N/A 11/23/2021   Procedure: OPEN COMPONENT SEPARATION AND MESH ABDOMINAL WALL RECONSTRUCTION FOR INCISIONAL HERNIA REPAIR, LYSIS OF ADHESIONS AND TRANSABDOMINAL PLANE BLOCK;  Surgeon: [name], MD;  Location: [facility];  Service: General;  Laterality: N/A;   TONSILLECTOMY      Social History   Socioeconomic History   Marital status: Married    Spouse name: Not on file   Number of children: Not on file   Years of education: Not on file   Highest education level: Not on file  Occupational History   Not on file  Tobacco Use   Smoking status: Never   Smokeless tobacco: Never  Vaping Use   Vaping Use: Never used  Substance and Sexual Activity   Alcohol use: No   Drug use: No   Sexual activity: Yes    Birth control/protection: None  Other Topics Concern   Not on file  Social History Narrative   Not on file   Social Determinants of Health   Financial Resource Strain: Not on file  Food Insecurity: Not on file  Transportation Needs: Not on file  Physical Activity: Not on file  Stress: Not on file  Social Connections: Not on file  Intimate Partner Violence: Not on file    Family History  Problem Relation Age of Onset   COPD Mother    Alzheimer's disease Father     No current  facility-administered medications for this encounter.   Current Outpatient Medications  Medication Sig Dispense Refill   atorvastatin (LIPITOR) 40 MG tablet TAKE ONE (1) TABLET EACH DAY 90 tablet 0   blood glucose meter kit and supplies Dispense based on patient and insurance preference. Use up to four times daily as directed. (FOR ICD-10 E10.9, E11.9). 1 each 0   carvedilol (COREG) 3.125 MG tablet Take 1 tablet (3.125 mg total) by mouth 2 (two) times daily. 180 tablet 1   fenofibrate 160 MG tablet TAKE ONE (1) TABLET EACH DAY 90 tablet 0   fondaparinux (ARIXTRA) 7.5 MG/0.6ML SOLN injection INJECT THE CONTENTS OF 1 SYRINGE (7.5 MG) UNDER THE SKIN DAILY 18 mL 0   levothyroxine (SYNTHROID) 50 MCG tablet TAKE ONE (1) TABLET EACH DAY 90 tablet 0   methocarbamol (ROBAXIN) 750 MG tablet Take 1 tablet (750 mg total) by mouth 4 (four) times daily as needed (use for muscle cramps/pain). 30 tablet 2   oxyCODONE (OXY IR/ROXICODONE) 5 MG immediate release tablet Take 1-2 tablets (5-10 mg total) by mouth every 6 (six) hours as needed for moderate pain, severe pain or breakthrough pain. 20 tablet 0   testosterone cypionate (DEPOTESTOSTERONE CYPIONATE) 200 MG/ML injection INJECT 0.5ML IM EVERY 7 DAYS 2 mL 0   valsartan-hydrochlorothiazide (DIOVAN-HCT) 320-25 MG tablet TAKE ONE TABLET BY MOUTH EACH DAY FOR BLOOD PRESSURE 30 tablet 2     Allergies  Allergen Reactions   Metformin And Related     Loose bloody stools    Signed: Morton Peters, MD, FACS, MASCRS Esophageal, Gastrointestinal & Colorectal Surgery Robotic and Minimally Invasive Surgery  [facility], [place]. [place]  [address], [place], [place] [address] [phone] Fax [phone] Main  CONTACT INFORMATION:  Weekday (9AM-5PM): Call [facility] main office at [phone]  Weeknight (5PM-9AM) or Weekend/Holiday: Check [email or web address] (password " [email or web address]") for General  Surgery [facility] coverage  (Please, do not use SecureChat as it is not reliable communication to operating surgeons for immediate patient care)      11/25/2021, 7:05 AM

## 2021-12-02 ENCOUNTER — Other Ambulatory Visit: Payer: Self-pay | Admitting: Family Medicine

## 2021-12-02 DIAGNOSIS — E1169 Type 2 diabetes mellitus with other specified complication: Secondary | ICD-10-CM

## 2021-12-02 DIAGNOSIS — E785 Hyperlipidemia, unspecified: Secondary | ICD-10-CM

## 2021-12-07 ENCOUNTER — Other Ambulatory Visit: Payer: Self-pay | Admitting: Family Medicine

## 2021-12-07 DIAGNOSIS — E291 Testicular hypofunction: Secondary | ICD-10-CM

## 2021-12-08 ENCOUNTER — Other Ambulatory Visit: Payer: Self-pay | Admitting: Family Medicine

## 2021-12-08 MED ORDER — FONDAPARINUX SODIUM 7.5 MG/0.6ML ~~LOC~~ SOLN: SUBCUTANEOUS | 2 refills | Status: DC

## 2021-12-08 NOTE — Telephone Encounter (Signed)
°  Prescription Request  12/08/2021  Is this a "Controlled Substance" medicine? no  Have you seen your PCP in the last 2 weeks? 11/28  If YES, route message to pool  -  If NO, patient needs to be scheduled for appointment.  What is the name of the medication or equipment? fondaparinux  Have you contacted your pharmacy to request a refill? yes   Which pharmacy would you like this sent to? [email or web address]   Patient notified that their request is being sent to the clinical staff for review and that they should receive a response within 2 business days.

## 2021-12-13 ENCOUNTER — Telehealth: Payer: Self-pay | Admitting: *Deleted

## 2021-12-13 ENCOUNTER — Other Ambulatory Visit: Payer: Self-pay | Admitting: Family Medicine

## 2021-12-13 ENCOUNTER — Telehealth: Payer: Self-pay | Admitting: Family Medicine

## 2021-12-13 NOTE — Telephone Encounter (Signed)
He is discontinuing the other medication, Arixtra and will be taking xarelto instead.

## 2021-12-13 NOTE — Telephone Encounter (Signed)
I spoke to pt and he states he isn't on the xarelto because it was too expensive so he is on the [place]. I then called [facility] back to advise that pt is not taking xarelto and they will do the Arixtra and pt is aware.

## 2021-12-13 NOTE — Telephone Encounter (Signed)
[facility] calling about Xarelto - another provider has called this in for patient and they are wanting to make sure that it is okay for the patient to take that along with the other meds that hs is on. Please call back and advise.

## 2021-12-13 NOTE — Telephone Encounter (Signed)
TC from [facility] refilled Arixtra inj on 12/08/21 to their [place] sent them a Rx for Xarelto on 11/01/21. Is pt to be on both. Pharmacy called [facility] and was instructed that he was to only be on the Xarelto now, which is not on his current med list. In looking at their notes on 10/25/21 it looks like pt was to stop the Arixtra inj and go on Xarelto Please advise, pt may need to be re advised as well.

## 2021-12-13 NOTE — Telephone Encounter (Signed)
There are 2 telephone encounters regarding this-pt was unable to afford xarelto so he is on Arixtra and I have called [facility] pharmacy and pt both. Please see previous telephone encounter.

## 2021-12-13 NOTE — Telephone Encounter (Signed)
Switch to xarelto. DC Arixtra

## 2021-12-23 ENCOUNTER — Other Ambulatory Visit: Payer: Self-pay | Admitting: Family Medicine

## 2021-12-23 DIAGNOSIS — E039 Hypothyroidism, unspecified: Secondary | ICD-10-CM

## 2022-02-10 ENCOUNTER — Other Ambulatory Visit: Payer: Self-pay | Admitting: Family Medicine

## 2022-02-10 DIAGNOSIS — E1169 Type 2 diabetes mellitus with other specified complication: Secondary | ICD-10-CM

## 2022-02-16 ENCOUNTER — Ambulatory Visit (INDEPENDENT_AMBULATORY_CARE_PROVIDER_SITE_OTHER): Payer: 59

## 2022-02-16 ENCOUNTER — Ambulatory Visit (INDEPENDENT_AMBULATORY_CARE_PROVIDER_SITE_OTHER): Payer: 59 | Admitting: Family Medicine

## 2022-02-16 ENCOUNTER — Telehealth: Payer: Self-pay | Admitting: Family Medicine

## 2022-02-16 ENCOUNTER — Encounter: Payer: Self-pay | Admitting: Family Medicine

## 2022-02-16 VITALS — BP 123/66 | HR 88 | Temp 98.3°F | Ht 73.0 in | Wt 219.1 lb

## 2022-02-16 DIAGNOSIS — K432 Incisional hernia without obstruction or gangrene: Secondary | ICD-10-CM | POA: Diagnosis not present

## 2022-02-16 DIAGNOSIS — R1900 Intra-abdominal and pelvic swelling, mass and lump, unspecified site: Secondary | ICD-10-CM | POA: Diagnosis not present

## 2022-02-16 NOTE — Progress Notes (Signed)
Subjective:  Patient ID: James [name], male    DOB: 12/17/1966, 56 y.o.   MRN: [id number]  Patient Care Team: Claretta Fraise, MD as PCP - General (Family Medicine) Gareth Morgan, MD (Inactive) as Referring Physician (Hematology) Burke Keels, MD (Surgery) [name], MD as Consulting Physician (General Surgery)   Chief Complaint:  GI Problem   HPI: James [name] is a 56 y.o. male presenting on 02/16/2022 for GI Problem   Pt presents today with complaints of right sided abdominal swelling. No other associated symptoms. Recent hernia repair. Has not followed up with surgeon. States he noticed the swelling after taking off his abdominal binder.   GI Problem Primary symptoms do not include fever, weight loss, fatigue, abdominal pain, nausea, vomiting, diarrhea, melena, hematemesis, jaundice, hematochezia, dysuria, myalgias, arthralgias or rash.  The illness does not include constipation.    Relevant past medical, surgical, family, and social history reviewed and updated as indicated.  Allergies and medications reviewed and updated. Data reviewed: Chart in Epic.   Past Medical History:  Diagnosis Date   Arthritis    Hx of exploratory laparotomy    enterectomy small intest. resection,celiotomy   Hyperlipidemia    Hypertension    Hypothyroidism    Pre-diabetes    Sleep apnea     Past Surgical History:  Procedure Laterality Date   BACK SURGERY     BIOPSY  11/22/2021   Procedure: BIOPSY;  Surgeon: Harvel Quale, MD;  [facility];  Service: Gastroenterology;;   COLONOSCOPY WITH PROPOFOL N/A 11/22/2021   Procedure: COLONOSCOPY WITH PROPOFOL;  Surgeon: Harvel Quale, MD;  [facility];  Service: Gastroenterology;  Laterality: N/A;  8:45   ESOPHAGOGASTRODUODENOSCOPY (EGD) WITH PROPOFOL N/A 11/22/2021   Procedure: ESOPHAGOGASTRODUODENOSCOPY (EGD) WITH PROPOFOL;  Surgeon: Harvel Quale, MD;  [facility];   Service: Gastroenterology;  Laterality: N/A;   INCISIONAL HERNIA REPAIR N/A 11/23/2021   Procedure: OPEN COMPONENT SEPARATION AND MESH ABDOMINAL WALL RECONSTRUCTION FOR INCISIONAL HERNIA REPAIR, LYSIS OF ADHESIONS AND TRANSABDOMINAL PLANE BLOCK;  Surgeon: [name], MD;  Location: [facility];  Service: General;  Laterality: N/A;   POLYPECTOMY  11/22/2021   Procedure: POLYPECTOMY;  Surgeon: Montez Morita, Quillian Quince, MD;  [facility];  Service: Gastroenterology;;   TONSILLECTOMY      Social History   Socioeconomic History   Marital status: Married    Spouse name: Not on file   Number of children: Not on file   Years of education: Not on file   Highest education level: Not on file  Occupational History   Not on file  Tobacco Use   Smoking status: Never   Smokeless tobacco: Never  Vaping Use   Vaping Use: Never used  Substance and Sexual Activity   Alcohol use: No   Drug use: No   Sexual activity: Yes    Birth control/protection: None  Other Topics Concern   Not on file  Social History Narrative   Not on file   Social Determinants of Health   Financial Resource Strain: Not on file  Food Insecurity: Not on file  Transportation Needs: Not on file  Physical Activity: Not on file  Stress: Not on file  Social Connections: Not on file  Intimate Partner Violence: Not on file    Outpatient Encounter Medications as of 02/16/2022  Medication Sig   atorvastatin (LIPITOR) 40 MG tablet TAKE ONE (1) TABLET EACH DAY   blood glucose meter kit  and supplies Dispense based on patient and insurance preference. Use up to four times daily as directed. (FOR ICD-10 E10.9, E11.9).   carvedilol (COREG) 3.125 MG tablet Take 1 tablet (3.125 mg total) by mouth 2 (two) times daily.   fenofibrate 160 MG tablet TAKE ONE (1) TABLET EACH DAY   levothyroxine (SYNTHROID) 50 MCG tablet TAKE ONE (1) TABLET EACH DAY   testosterone cypionate (DEPOTESTOSTERONE CYPIONATE) 200 MG/ML injection INJECT  0.5ML IM EVERY 7 DAYS   valsartan-hydrochlorothiazide (DIOVAN-HCT) 320-25 MG tablet TAKE ONE TABLET BY MOUTH EACH DAY FOR BLOOD PRESSURE   XARELTO 20 MG TABS tablet SMARTSIG:1 Tablet(s) By Mouth Every Evening   methocarbamol (ROBAXIN) 750 MG tablet Take 1 tablet (750 mg total) by mouth 4 (four) times daily as needed (use for muscle cramps/pain). (Patient not taking: Reported on 02/16/2022)   [DISCONTINUED] oxyCODONE (OXY IR/ROXICODONE) 5 MG immediate release tablet Take 1-2 tablets (5-10 mg total) by mouth every 6 (six) hours as needed for moderate pain, severe pain or breakthrough pain.   No facility-administered encounter medications on file as of 02/16/2022.    Allergies  Allergen Reactions   Metformin And Related     Loose bloody stools    Review of Systems  Constitutional:  Negative for fatigue, fever and weight loss.  Gastrointestinal:  Positive for abdominal distention. Negative for abdominal pain, anal bleeding, blood in stool, constipation, diarrhea, hematemesis, hematochezia, jaundice, melena, nausea, rectal pain and vomiting.  Genitourinary:  Negative for dysuria.  Musculoskeletal:  Negative for arthralgias and myalgias.  Skin:  Negative for rash.  All other systems reviewed and are negative.      Objective:  BP 123/66    Pulse 88    Temp 98.3 F (36.8 C) (Temporal)    Ht '6\' 1"'  (1.854 m)    Wt 219 lb 2 oz (99.4 kg)    BMI 28.91 kg/m    Wt Readings from Last 3 Encounters:  02/16/22 219 lb 2 oz (99.4 kg)  11/23/21 234 lb 5.6 oz (106.3 kg)  11/14/21 234 lb 6.4 oz (106.3 kg)    Physical Exam Vitals and nursing note reviewed.  Constitutional:      General: He is not in acute distress.    Appearance: Normal appearance. He is well-developed and well-groomed. He is not ill-appearing, toxic-appearing or diaphoretic.  HENT:     Head: Normocephalic and atraumatic.     Jaw: There is normal jaw occlusion.     Right Ear: Hearing normal.     Left Ear: Hearing normal.     Nose:  Nose normal.     Mouth/Throat:     Lips: Pink.     Mouth: Mucous membranes are moist.     Pharynx: Oropharynx is clear. Uvula midline.  Eyes:     General: Lids are normal.     Extraocular Movements: Extraocular movements intact.     Conjunctiva/sclera: Conjunctivae normal.     Pupils: Pupils are equal, round, and reactive to light.  Neck:     Thyroid: No thyroid mass, thyromegaly or thyroid tenderness.     Vascular: No carotid bruit or JVD.     Trachea: Trachea and phonation normal.  Cardiovascular:     Rate and Rhythm: Normal rate and regular rhythm.     Chest Wall: PMI is not displaced.     Pulses: Normal pulses.     Heart sounds: Normal heart sounds. No murmur heard.   No friction rub. No gallop.  Pulmonary:  Effort: Pulmonary effort is normal. No respiratory distress.     Breath sounds: Normal breath sounds. No wheezing.  Abdominal:     General: Bowel sounds are normal. There is no abdominal bruit.     Palpations: Abdomen is soft. There is no hepatomegaly or splenomegaly.     Tenderness: There is no abdominal tenderness. There is no right CVA tenderness or left CVA tenderness.     Hernia: No hernia is present.    Musculoskeletal:        General: Normal range of motion.     Cervical back: Normal range of motion and neck supple.     Right lower leg: No edema.     Left lower leg: No edema.  Lymphadenopathy:     Cervical: No cervical adenopathy.  Skin:    General: Skin is warm and dry.     Capillary Refill: Capillary refill takes less than 2 seconds.     Coloration: Skin is not cyanotic, jaundiced or pale.     Findings: No rash.  Neurological:     General: No focal deficit present.     Mental Status: He is alert and oriented to person, place, and time.     Sensory: Sensation is intact.     Motor: Motor function is intact.     Coordination: Coordination is intact.     Gait: Gait is intact.     Deep Tendon Reflexes: Reflexes are normal and symmetric.  Psychiatric:         Attention and Perception: Attention and perception normal.        Mood and Affect: Mood and affect normal.        Speech: Speech normal.        Behavior: Behavior normal. Behavior is cooperative.        Thought Content: Thought content normal.        Cognition and Memory: Cognition and memory normal.        Judgment: Judgment normal.    Results for orders placed or performed during the hospital encounter of 11/23/21  Glucose, capillary  Result Value Ref Range   Glucose-Capillary 105 (H) 70 - 99 mg/dL   Comment 1 Notify RN    Comment 2 Document in Chart   Glucose, capillary  Result Value Ref Range   Glucose-Capillary 163 (H) 70 - 99 mg/dL   Comment 1 Notify RN   Basic metabolic panel  Result Value Ref Range   Sodium 129 (L) 135 - 145 mmol/L   Potassium 3.8 3.5 - 5.1 mmol/L   Chloride 99 98 - 111 mmol/L   CO2 24 22 - 32 mmol/L   Glucose, Bld 113 (H) 70 - 99 mg/dL   BUN 19 6 - 20 mg/dL   Creatinine, Ser 1.64 (H) 0.61 - 1.24 mg/dL   Calcium 8.0 (L) 8.9 - 10.3 mg/dL   GFR, Estimated 49 (L) >60 mL/min   Anion gap 6 5 - 15  Magnesium  Result Value Ref Range   Magnesium 1.7 1.7 - 2.4 mg/dL  CBC  Result Value Ref Range   WBC 11.1 (H) 4.0 - 10.5 K/uL   RBC 3.45 (L) 4.22 - 5.81 MIL/uL   Hemoglobin 8.6 (L) 13.0 - 17.0 g/dL   HCT 27.8 (L) 39.0 - 52.0 %   MCV 80.6 80.0 - 100.0 fL   MCH 24.9 (L) 26.0 - 34.0 pg   MCHC 30.9 30.0 - 36.0 g/dL   RDW 17.6 (H) 11.5 - 15.5 %  Platelets 128 (L) 150 - 400 K/uL   nRBC 0.0 0.0 - 0.2 %  I-STAT, chem 8  Result Value Ref Range   Sodium 135 135 - 145 mmol/L   Potassium 3.8 3.5 - 5.1 mmol/L   Chloride 100 98 - 111 mmol/L   BUN 8 6 - 20 mg/dL   Creatinine, Ser 1.10 0.61 - 1.24 mg/dL   Glucose, Bld 140 (H) 70 - 99 mg/dL   Calcium, Ion 1.20 1.15 - 1.40 mmol/L   TCO2 22 22 - 32 mmol/L   Hemoglobin 9.2 (L) 13.0 - 17.0 g/dL   HCT 27.0 (L) 39.0 - 52.0 %  Surgical pathology  Result Value Ref Range   SURGICAL PATHOLOGY      SURGICAL  PATHOLOGY CASE: [id number] PATIENT: Corinna Gab Surgical Pathology Report     Clinical History: Ventral incisional incarcerated abdominal wall hernia (crm)     FINAL MICROSCOPIC DIAGNOSIS:  A. HERNIA SAC, ABDOMINAL, EXCISION: Hernia sac. Fibrous scarring of the adjacent fascia with foreign body type granulomas. Negative for acute inflammation and neoplasm.   GROSS DESCRIPTION:  A. Received in formalin labeled with the patients name and "Abdominal hernia sac" is an 8.5 x 8.1 x 3.7 cm aggregate of red, rubbery, opaque fibromembranous tissue.  Representative sections are submitted in a single cassette.  (LEF 11/23/2021)   Final Diagnosis performed by Unknown Jim, MD.   Electronically signed 11/24/2021 Technical component performed at [facility], [place] [address], [place], [place] [address].  Professional component performed at [employer]. [facility], [place] [address] [place], [place] [address].  Immunohistochemi stry Technical component (if applicable) was performed at [facility]. [address], [place], [place], [place] [address].   IMMUNOHISTOCHEMISTRY DISCLAIMER (if applicable): Some of these immunohistochemical stains may have been developed and the performance characteristics determine by [facility]. Some may not have been cleared or approved by the U.S. Food and Drug Administration. The FDA has determined that such clearance or approval is not necessary. This test is used for clinical purposes. It should not be regarded as investigational or for research. This laboratory is certified under the [employer] (CLIA-88) as qualified to perform high complexity clinical laboratory testing.  The controls stained appropriately.      X-Ray: KUB: lateral shift of intestines noted on imaging, no obvious obstruction. Preliminary x-ray reading by Monia Pouch, FNP-C,  [facility].   Pertinent labs & imaging results that were available during my care of the patient were reviewed by me and considered in my medical decision making.  Assessment & Plan:  Denarius was seen today for gi problem.  Diagnoses and all orders for this visit:  Incisional hernia, without obstruction or gangrene Abdominal swelling KUB with noted lateral shift of intestines, no obvious obstruction. Denies constipation, diarrhea, or significant pain. Pt aware to follow up with surgeon.  -     DG Abd 1 View; Future     Continue all other maintenance medications.  Follow up plan: Return if symptoms worsen or fail to improve.   Continue healthy lifestyle choices, including diet (rich in fruits, vegetables, and lean proteins, and low in salt and simple carbohydrates) and exercise (at least 30 minutes of moderate physical activity daily).   The above assessment and management plan was discussed with the patient. The patient verbalized understanding of and has agreed to the management plan. Patient is aware to call the clinic if they develop any new symptoms or  if symptoms persist or worsen. Patient is aware when to return to the clinic for a follow-up visit. Patient educated on when it is appropriate to go to the emergency department.   Monia Pouch, FNP-C [facility] [phone]

## 2022-02-17 NOTE — Telephone Encounter (Signed)
MAILED DISK ?

## 2022-02-27 ENCOUNTER — Other Ambulatory Visit (INDEPENDENT_AMBULATORY_CARE_PROVIDER_SITE_OTHER): Payer: Self-pay | Admitting: Gastroenterology

## 2022-02-27 ENCOUNTER — Other Ambulatory Visit: Payer: Self-pay | Admitting: Family Medicine

## 2022-02-27 DIAGNOSIS — E1169 Type 2 diabetes mellitus with other specified complication: Secondary | ICD-10-CM

## 2022-02-27 DIAGNOSIS — E785 Hyperlipidemia, unspecified: Secondary | ICD-10-CM

## 2022-02-27 NOTE — Telephone Encounter (Signed)
Will refill medication for 3 months, needs follow up appointment with any provider in order to receive any refills. ? ?Thanks, ? ?Adrian Dinovo Castaneda, MD ?Gastroenterology and Hepatology ?[facility] ? ?

## 2022-02-28 NOTE — Progress Notes (Signed)
My notified through [facility] -  ?

## 2022-03-02 ENCOUNTER — Other Ambulatory Visit: Payer: Self-pay

## 2022-03-02 ENCOUNTER — Encounter: Payer: Self-pay | Admitting: Family Medicine

## 2022-03-02 ENCOUNTER — Encounter (INDEPENDENT_AMBULATORY_CARE_PROVIDER_SITE_OTHER): Payer: Self-pay | Admitting: Gastroenterology

## 2022-03-02 ENCOUNTER — Ambulatory Visit: Payer: 59 | Admitting: Family Medicine

## 2022-03-02 ENCOUNTER — Ambulatory Visit (INDEPENDENT_AMBULATORY_CARE_PROVIDER_SITE_OTHER): Payer: 59 | Admitting: Gastroenterology

## 2022-03-02 VITALS — BP 114/53 | HR 75 | Temp 98.0°F | Ht 73.0 in | Wt 221.4 lb

## 2022-03-02 VITALS — BP 125/82 | HR 83 | Temp 97.5°F | Ht 73.0 in | Wt 222.6 lb

## 2022-03-02 DIAGNOSIS — E291 Testicular hypofunction: Secondary | ICD-10-CM

## 2022-03-02 DIAGNOSIS — E785 Hyperlipidemia, unspecified: Secondary | ICD-10-CM

## 2022-03-02 DIAGNOSIS — K55069 Acute infarction of intestine, part and extent unspecified: Secondary | ICD-10-CM

## 2022-03-02 DIAGNOSIS — I8510 Secondary esophageal varices without bleeding: Secondary | ICD-10-CM

## 2022-03-02 DIAGNOSIS — E119 Type 2 diabetes mellitus without complications: Secondary | ICD-10-CM

## 2022-03-02 DIAGNOSIS — E1159 Type 2 diabetes mellitus with other circulatory complications: Secondary | ICD-10-CM | POA: Diagnosis not present

## 2022-03-02 DIAGNOSIS — K580 Irritable bowel syndrome with diarrhea: Secondary | ICD-10-CM

## 2022-03-02 DIAGNOSIS — K3189 Other diseases of stomach and duodenum: Secondary | ICD-10-CM

## 2022-03-02 DIAGNOSIS — E039 Hypothyroidism, unspecified: Secondary | ICD-10-CM

## 2022-03-02 DIAGNOSIS — I152 Hypertension secondary to endocrine disorders: Secondary | ICD-10-CM

## 2022-03-02 DIAGNOSIS — I8500 Esophageal varices without bleeding: Secondary | ICD-10-CM | POA: Insufficient documentation

## 2022-03-02 DIAGNOSIS — E1169 Type 2 diabetes mellitus with other specified complication: Secondary | ICD-10-CM | POA: Diagnosis not present

## 2022-03-02 DIAGNOSIS — I81 Portal vein thrombosis: Secondary | ICD-10-CM

## 2022-03-02 LAB — BAYER DCA HB A1C WAIVED: HB A1C (BAYER DCA - WAIVED): 5.1 % (ref 4.8–5.6)

## 2022-03-02 MED ORDER — SILDENAFIL CITRATE 20 MG PO TABS: ORAL_TABLET | ORAL | 5 refills | Status: AC

## 2022-03-02 MED ORDER — FENOFIBRATE 160 MG PO TABS: ORAL_TABLET | ORAL | 2 refills | Status: DC

## 2022-03-02 MED ORDER — LEVOTHYROXINE SODIUM 50 MCG PO TABS: ORAL_TABLET | ORAL | 2 refills | Status: DC

## 2022-03-02 MED ORDER — VALSARTAN-HYDROCHLOROTHIAZIDE 320-25 MG PO TABS: ORAL_TABLET | ORAL | 2 refills | Status: DC

## 2022-03-02 NOTE — Progress Notes (Signed)
Maylon Peppers, M.D. ?Gastroenterology & Hepatology ?[facility] ?[address] ?[place],  [address] ? ?Primary Care Physician: ?Claretta Fraise, MD ?[place] [address] ? ?I will communicate my assessment and recommendations to the referring MD via EMR. ? 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 ?Bowel ischemia secondary to thrombosis ?Grade 1 esophageal varices ?IBS-D ? ?History of Present Illness: ?James [name] is a 56 y.o. 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 diff,  who presents for follow up of splenic and mesenteric thrombosis. ? ?The patient was last seen on 07/02/2021. At that time, the patient was scheduled for an EGD and colonoscopy.  EGD showed presence of grade 1 esophageal varices, portal hypertensive gastropathy.  Colonoscopy showed presence of 6 polyps in the cecum between 1 to 2 mm in size, 4 polyps in the sigmoid, ascending colon and cecum.  2 to 5 mm in size.  Diverticulosis.  Small internal hemorrhoids.  Pathology was consistent with tubular adenomas except for sigmoid which was hyperplastic.  He was advised to have repeat colonoscopy in 2 years. ? ?Patient reports that after he had his surgery his change in his bowel movements has been worse.  He reports that since he was a child he has had chronic diarrhea episodes which has not changed in severity until he had his most recent surgery..  Depending on the type of food he eats his bowel movements may vary from 2-7 bowel movements per day. He had a CT scan in the past that that showed some moderate stool  and was advised to take Miralax daily - however this lead to multiple bowel movements per day, so he is currently taking it 3 times a week.  Most recent CT scan on file was from October 2022 which showed vaginal stool in the sigmoid and rectum.  Sometimes he has a little blood when he has multiple bowel movements but this is not frequent. No melena. Still having some soreness in the area where he had his hernia repair. The patient denies having any nausea, vomiting, fever, chills, hematochezia, melena, hematemesis, diarrhea, jaundice, pruritus or weight loss. ? ?He is taking peptobismol for diarrhea occasionally. ? ?Patient underwent abdominal wall construction for incisional hernia with mesh placement on 11/23/2021.  This surgery was performed by Dr. Johney Maine. ? ?Patient is currently on carvedilol 3.125 mg twice a day.  He is also taking Xarelto 20 mg daily. ? ?Last EGD: as above ?Last Colonoscopy: as above ? ?Past Medical History: ?Past Medical History:  ?Diagnosis Date  ? Arthritis   ? Hx of exploratory laparotomy   ? enterectomy small intest. resection,celiotomy  ? Hyperlipidemia   ? Hypertension   ? Hypothyroidism   ? Pre-diabetes   ? Sleep apnea   ? ? ?Past Surgical History: ?Past Surgical History:  ?Procedure Laterality Date  ? BACK SURGERY    ? BIOPSY  11/22/2021  ? Procedure: BIOPSY;  Surgeon: Montez Morita, Quillian Quince, MD;  [facility];  Service: Gastroenterology;;  ? COLONOSCOPY WITH PROPOFOL N/A 11/22/2021  ? Procedure: COLONOSCOPY WITH PROPOFOL;  Surgeon: Harvel Quale, MD;  [facility];  Service: Gastroenterology;  Laterality: N/A;  8:45  ? ESOPHAGOGASTRODUODENOSCOPY (EGD) WITH PROPOFOL  N/A 11/22/2021  ? Procedure: ESOPHAGOGASTRODUODENOSCOPY (EGD) WITH PROPOFOL;  Surgeon: Harvel Quale, MD;  [facility];  Service: Gastroenterology;  Laterality: N/A;  ? INCISIONAL HERNIA REPAIR N/A 11/23/2021  ? Procedure: OPEN COMPONENT SEPARATION AND MESH ABDOMINAL  WALL RECONSTRUCTION FOR INCISIONAL HERNIA REPAIR, LYSIS OF ADHESIONS AND TRANSABDOMINAL PLANE BLOCK;  Surgeon: [name], MD;  Location[facility];  Service: General;  Laterality: N/A;  ? POLYPECTOMY  11/22/2021  ? Procedure: POLYPECTOMY;  Surgeon: Harvel Quale, MD;  [facility];  Service: Gastroenterology;;  ? TONSILLECTOMY    ? ? ?Family History: ?Family History  ?Problem Relation Age of Onset  ? COPD Mother   ? Alzheimer's disease Father   ? ? ?Social History: ?Social History  ? ?Tobacco Use  ?Smoking Status Never  ?Smokeless Tobacco Never  ? ?Social History  ? ?Substance and Sexual Activity  ?Alcohol Use No  ? ?Social History  ? ?Substance and Sexual Activity  ?Drug Use No  ? ? ?Allergies: ?Allergies  ?Allergen Reactions  ? Metformin And Related   ?  Loose bloody stools  ? ? ?Medications: ?Current Outpatient Medications  ?Medication Sig Dispense Refill  ? atorvastatin (LIPITOR) 40 MG tablet TAKE ONE (1) TABLET EACH DAY 90 tablet 0  ? blood glucose meter kit and supplies Dispense based on patient and insurance preference. Use up to four times daily as directed. (FOR ICD-10 E10.9, E11.9). 1 each 0  ? carvedilol (COREG) 3.125 MG tablet TAKE ONE (1) TABLET BY MOUTH TWO (2) TIMES DAILY 180 tablet 0  ? fenofibrate 160 MG tablet TAKE ONE (1) TABLET EACH DAY 90 tablet 0  ? levothyroxine (SYNTHROID) 50 MCG tablet TAKE ONE (1) TABLET EACH DAY 90 tablet 0  ? testosterone cypionate (DEPOTESTOSTERONE CYPIONATE) 200 MG/ML injection INJECT 0.5ML IM EVERY 7 DAYS 10 mL 0  ? valsartan-hydrochlorothiazide (DIOVAN-HCT) 320-25 MG tablet TAKE ONE TABLET BY MOUTH EACH DAY FOR BLOOD PRESSURE 90 tablet 0  ? XARELTO 20 MG TABS tablet SMARTSIG:1 Tablet(s) By Mouth Every Evening    ? ?No current facility-administered medications for this visit.  ? ? ?Review of Systems: ?GENERAL: negative for malaise, night sweats ?HEENT: No changes in hearing or vision, no nose bleeds or other nasal problems. ?NECK: Negative for  lumps, goiter, pain and significant neck swelling ?RESPIRATORY: Negative for cough, wheezing ?CARDIOVASCULAR: Negative for chest pain, leg swelling, palpitations, orthopnea ?GI: SEE HPI ?MUSCULOSKELETAL: Negative for joint pain or swelling, back pain, and muscle pain. ?SKIN: Negative for lesions, rash ?PSYCH: Negative for sleep disturbance, mood disorder and recent psychosocial stressors. ?HEMATOLOGY Negative for prolonged bleeding, bruising easily, and swollen nodes. ?ENDOCRINE: Negative for cold or heat intolerance, polyuria, polydipsia and goiter. ?NEURO: negative for tremor, gait imbalance, syncope and seizures. ?The remainder of the review of systems is noncontributory. ? ? ?Physical Exam: ?BP 125/82 (BP Location: Left Arm, Patient Position: Sitting, Cuff Size: Large)   Pulse 83   Temp (!) 97.5 ?F (36.4 ?C) (Oral)   Ht '6\' 1"'  (1.854 m)   Wt 222 lb 9.6 oz (101 kg)   BMI 29.37 kg/m?  ?GENERAL: The patient is AO x3, in no acute distress. ?HEENT: Head is normocephalic and atraumatic. EOMI are intact. Mouth is well hydrated and without lesions. ?NECK: Supple. No masses ?LUNGS: Clear to auscultation. No presence of rhonchi/wheezing/rales. Adequate chest expansion ?HEART: RRR, normal s1 and s2. ?ABDOMEN: Soft, nontender, no guarding, no peritoneal signs, and nondistended. BS +. No masses. Has mid incisional scar. ?EXTREMITIES: Without  any cyanosis, clubbing, rash, lesions or edema. ?NEUROLOGIC: AOx3, no focal motor deficit. ?SKIN: no jaundice, no rashes ? ?Imaging/Labs: ?as above ? ?I personally reviewed and interpreted the available labs, imaging and endoscopic files. ? ?Impression and Plan: ?James [name] is a 56 y.o. 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 diff,   who presents for follow up of splenic and mesenteric thrombosis.  Patient has remained asymptomatic after his bowel resection and he is doing well while taking Xarelto.  He is currently following with hematol

## 2022-03-02 NOTE — Patient Instructions (Addendum)
Increase carvedilol to 6.25 mg twice a day (two pills every 12 hours, call us before you run out of your current script) ?Explained presumed etiology of IBS symptoms. Patient was counseled about the benefit of implementing a low FODMAP to improve symptoms and recurrent episodes. A dietary list was provided to the patient. Also, the patient was counseled about the benefit of avoiding stressing situations and potential environmental triggers leading to symptomatology. ?Continue Imodium as needed for diarrhea ?Follow up with hematology ?

## 2022-03-02 NOTE — Progress Notes (Signed)
? ?Subjective:  ?Patient ID: James [name], male    DOB: Nov 06, 1966  Age: 56 y.o. MRN: [id number] ? ?CC: Medical Management of Chronic Issues ? ? ?HPI ?James [name] presents forFollow-up of diabetes. Patient checks blood sugar at home.  ? 90-100 fasting and not checking postprandial ?Patient denies symptoms such as polyuria, polydipsia, excessive hunger, nausea ?No significant hypoglycemic spells noted. ?Medications reviewed. Pt reports taking them regularly without complication/adverse reaction being reported today.  ? ? ?History ?James [name] has a past medical history of Arthritis, exploratory laparotomy, Hyperlipidemia, Hypertension, Hypothyroidism, Pre-diabetes, and Sleep apnea.  ? ?James [name] has a past surgical history that includes Back surgery; Tonsillectomy; Incisional hernia repair (N/A, 11/23/2021); Colonoscopy with propofol (N/A, 11/22/2021); Esophagogastroduodenoscopy (egd) with propofol (N/A, 11/22/2021); polypectomy (11/22/2021); and biopsy (11/22/2021).  ? ?His family history includes Alzheimer's disease in his father; COPD in his mother.James [name] reports that James [name] has never smoked. James [name] has never used smokeless tobacco. James [name] reports that James [name] does not drink alcohol and does not use drugs. ? ?Current Outpatient Medications on File Prior to Visit  ?Medication Sig Dispense Refill  ? atorvastatin (LIPITOR) 40 MG tablet TAKE ONE (1) TABLET EACH DAY 90 tablet 0  ? blood glucose meter kit and supplies Dispense based on patient and insurance preference. Use up to four times daily as directed. (FOR ICD-10 E10.9, E11.9). 1 each 0  ? XARELTO 20 MG TABS tablet SMARTSIG:1 Tablet(s) By Mouth Every Evening    ? [DISCONTINUED] carvedilol (COREG) 3.125 MG tablet TAKE ONE (1) TABLET BY MOUTH TWO (2) TIMES DAILY 180 tablet 0  ? ?No current facility-administered medications on file prior to visit.  ? ? ?ROS ?Review of Systems  ?Constitutional:  Negative for fever.  ?Respiratory:  Negative for shortness of breath.   ?Cardiovascular:  Negative for  chest pain.  ?Gastrointestinal:  Positive for abdominal pain. Abdominal distention: sore since surgery. ?Musculoskeletal:  Negative for arthralgias.  ?Skin:  Negative for rash.  ? ?Objective:  ?BP (!) 114/53   Pulse 75   Temp 98 ?F (36.7 ?C)   Ht _0  (1.854 m)   Wt 221 lb 6.4 oz (100.4 kg)   SpO2 100%   BMI 29.21 kg/m?  ? ?BP Readings from Last 3 Encounters:  ?03/02/22 (!) 114/53  ?03/02/22 125/82  ?02/16/22 123/66  ? ? ?Wt Readings from Last 3 Encounters:  ?03/02/22 221 lb 6.4 oz (100.4 kg)  ?03/02/22 222 lb 9.6 oz (101 kg)  ?02/16/22 219 lb 2 oz (99.4 kg)  ? ? ? ?Physical Exam ?Vitals reviewed.  ?Constitutional:   ?   Appearance: James [name] is well-developed.  ?HENT:  ?   Head: Normocephalic and atraumatic.  ?   Right Ear: External ear normal.  ?   Left Ear: External ear normal.  ?   Mouth/Throat:  ?   Pharynx: No oropharyngeal exudate or posterior oropharyngeal erythema.  ?Eyes:  ?   Pupils: Pupils are equal, round, and reactive to light.  ?Cardiovascular:  ?   Rate and Rhythm: Normal rate and regular rhythm.  ?   Heart sounds: No murmur heard. ?Pulmonary:  ?   Effort: No respiratory distress.  ?   Breath sounds: Normal breath sounds.  ?Abdominal:  ?   Tenderness: There is no abdominal tenderness.  ?Musculoskeletal:  ?   Cervical back: Normal range of motion and neck supple.  ?Neurological:  ?   Mental Status: James [name] is alert and oriented to person, place, and time.  ? ? ?A1c=5.1 ? ?Assessment &  Plan:  ? ?James [name] was seen today for medical management of chronic issues. ? ?Diagnoses and all orders for this visit: ? ?Type 2 diabetes mellitus without complication, without long-term current use of insulin (James [name]) ?-     Bayer DCA Hb A1c Waived ? ?Hyperlipidemia associated with type 2 diabetes mellitus ([place]) ?-     Lipid panel ?-     fenofibrate 160 MG tablet; TAKE ONE (1) TABLET EACH DAY ? ?Hypertension associated with diabetes ([place]) ?-     CBC with Differential/Platelet ?-     CMP14+EGFR ? ?Acquired hypothyroidism ?-      TSH + free T4 ?-     levothyroxine (SYNTHROID) 50 MCG tablet; TAKE ONE (1) TABLET EACH DAY ? ?Hypogonadism in male ?-     Testosterone,Free and Total ? ?Other orders ?-     valsartan-hydrochlorothiazide (DIOVAN-HCT) 320-25 MG tablet; TAKE ONE TABLET BY MOUTH EACH DAY FOR BLOOD PRESSURE ?-     sildenafil (REVATIO) 20 MG tablet; Take 2-5 pills at once, orally, with each sexual encounter ? ?I am concerned that his portal, splenic, SMV, and intrahepatic venous thromboses while on anticoagulation are at least in part due to his use of testosterone injection.  Because of this I have asked him to discontinue use of the testosterone supplement.  Hopefully James [name] can have a good response to sildenafil in spite of this. ? ?Additionally James [name] has lost 60 pounds since his onset mesenteric ischemia.  As result his diabetes is now at nondiabetic levels with regard to A1c.  Since James [name] is off of all treatment for diabetes at the same time I believe we can resolve this diagnosis as long as James [name] keeps his weight off. ? ? ?I have discontinued Havard W. Prosser's testosterone cypionate. I am also having him start on sildenafil. Additionally, I am having him maintain his blood glucose meter kit and supplies, Xarelto, atorvastatin, fenofibrate, levothyroxine, and valsartan-hydrochlorothiazide. ? ? ? ? ?Follow-up: Return in about 6 months (around 09/02/2022). ? ?Claretta Fraise, M.D. ?

## 2022-03-03 LAB — CBC WITH DIFFERENTIAL/PLATELET
Basophils Absolute: 0.1 10*3/uL (ref 0.0–0.2)
Basos: 1 %
EOS (ABSOLUTE): 0.1 10*3/uL (ref 0.0–0.4)
Eos: 2 %
Hematocrit: 37.8 % (ref 37.5–51.0)
Hemoglobin: 11.6 g/dL — ABNORMAL LOW (ref 13.0–17.7)
Immature Grans (Abs): 0 10*3/uL (ref 0.0–0.1)
Immature Granulocytes: 0 %
Lymphocytes Absolute: 1 10*3/uL (ref 0.7–3.1)
Lymphs: 18 %
MCH: 24 pg — ABNORMAL LOW (ref 26.6–33.0)
MCHC: 30.7 g/dL — ABNORMAL LOW (ref 31.5–35.7)
MCV: 78 fL — ABNORMAL LOW (ref 79–97)
Monocytes Absolute: 0.5 10*3/uL (ref 0.1–0.9)
Monocytes: 9 %
Neutrophils Absolute: 3.8 10*3/uL (ref 1.4–7.0)
Neutrophils: 70 %
Platelets: 132 10*3/uL — ABNORMAL LOW (ref 150–450)
RBC: 4.84 x10E6/uL (ref 4.14–5.80)
RDW: 14.4 % (ref 11.6–15.4)
WBC: 5.4 10*3/uL (ref 3.4–10.8)

## 2022-03-03 LAB — LIPID PANEL
Chol/HDL Ratio: 2.2 ratio (ref 0.0–5.0)
Cholesterol, Total: 65 mg/dL — ABNORMAL LOW (ref 100–199)
HDL: 30 mg/dL — ABNORMAL LOW (ref >39)
LDL Chol Calc (NIH): 20 mg/dL (ref 0–99)
Triglycerides: 61 mg/dL (ref 0–149)
VLDL Cholesterol Cal: 15 mg/dL (ref 5–40)

## 2022-03-03 LAB — CMP14+EGFR
ALT: 19 IU/L (ref 0–44)
AST: 25 IU/L (ref 0–40)
Albumin/Globulin Ratio: 1.7 (ref 1.2–2.2)
Albumin: 4.5 g/dL (ref 3.8–4.9)
Alkaline Phosphatase: 57 IU/L (ref 44–121)
BUN/Creatinine Ratio: 12 (ref 9–20)
BUN: 20 mg/dL (ref 6–24)
Bilirubin Total: 0.7 mg/dL (ref 0.0–1.2)
CO2: 23 mmol/L (ref 20–29)
Calcium: 9.3 mg/dL (ref 8.7–10.2)
Chloride: 102 mmol/L (ref 96–106)
Creatinine, Ser: 1.67 mg/dL — ABNORMAL HIGH (ref 0.76–1.27)
Globulin, Total: 2.6 g/dL (ref 1.5–4.5)
Glucose: 117 mg/dL — ABNORMAL HIGH (ref 70–99)
Potassium: 4.2 mmol/L (ref 3.5–5.2)
Sodium: 137 mmol/L (ref 134–144)
Total Protein: 7.1 g/dL (ref 6.0–8.5)
eGFR: 48 mL/min/{1.73_m2} — ABNORMAL LOW (ref >59)

## 2022-03-03 LAB — TSH+FREE T4
Free T4: 1.1 ng/dL (ref 0.82–1.77)
TSH: 3.31 u[IU]/mL (ref 0.450–4.500)

## 2022-04-17 ENCOUNTER — Other Ambulatory Visit (INDEPENDENT_AMBULATORY_CARE_PROVIDER_SITE_OTHER): Payer: Self-pay | Admitting: Gastroenterology

## 2022-04-17 ENCOUNTER — Telehealth (INDEPENDENT_AMBULATORY_CARE_PROVIDER_SITE_OTHER): Payer: Self-pay | Admitting: *Deleted

## 2022-04-17 DIAGNOSIS — I8500 Esophageal varices without bleeding: Secondary | ICD-10-CM

## 2022-04-17 MED ORDER — CARVEDILOL 6.25 MG PO TABS: 6.2500 mg | ORAL_TABLET | Freq: Two times a day (BID) | ORAL | 3 refills | Status: DC

## 2022-04-17 NOTE — Telephone Encounter (Signed)
Fax from the drug store requesting refill on carvedilol 3.125 bid. This med is not on med list. Canceled on 03/02/22 at office visit. Note states to increase to 6.'25mg'$  bid. I do not see where new rx was sent in. Is it ok for me to send new dose to pharmacy? ?

## 2022-04-17 NOTE — Telephone Encounter (Signed)
I refilled the 6.25 BID dosing. Script sent to [place], [place]  ?

## 2022-05-05 ENCOUNTER — Other Ambulatory Visit: Payer: Self-pay | Admitting: Family Medicine

## 2022-05-26 ENCOUNTER — Other Ambulatory Visit: Payer: Self-pay | Admitting: Family Medicine

## 2022-05-26 DIAGNOSIS — E1169 Type 2 diabetes mellitus with other specified complication: Secondary | ICD-10-CM

## 2022-05-29 ENCOUNTER — Other Ambulatory Visit (INDEPENDENT_AMBULATORY_CARE_PROVIDER_SITE_OTHER): Payer: Self-pay

## 2022-05-29 DIAGNOSIS — I8500 Esophageal varices without bleeding: Secondary | ICD-10-CM

## 2022-05-29 MED ORDER — CARVEDILOL 6.25 MG PO TABS: 6.2500 mg | ORAL_TABLET | Freq: Two times a day (BID) | ORAL | 3 refills | Status: DC

## 2022-06-14 ENCOUNTER — Encounter (INDEPENDENT_AMBULATORY_CARE_PROVIDER_SITE_OTHER): Payer: Self-pay | Admitting: Gastroenterology

## 2022-07-04 ENCOUNTER — Ambulatory Visit: Payer: 59 | Admitting: Family Medicine

## 2022-07-05 ENCOUNTER — Encounter: Payer: Self-pay | Admitting: Family Medicine

## 2022-08-03 ENCOUNTER — Other Ambulatory Visit: Payer: Self-pay | Admitting: Family Medicine

## 2022-08-03 NOTE — Telephone Encounter (Signed)
Patient has appt 09/04/22

## 2022-08-07 DIAGNOSIS — Z79890 Hormone replacement therapy (postmenopausal): Secondary | ICD-10-CM | POA: Diagnosis not present

## 2022-08-07 DIAGNOSIS — E291 Testicular hypofunction: Secondary | ICD-10-CM | POA: Diagnosis not present

## 2022-08-07 DIAGNOSIS — E611 Iron deficiency: Secondary | ICD-10-CM | POA: Diagnosis not present

## 2022-08-07 DIAGNOSIS — I8290 Acute embolism and thrombosis of unspecified vein: Secondary | ICD-10-CM | POA: Diagnosis not present

## 2022-08-07 DIAGNOSIS — Z6830 Body mass index (BMI) 30.0-30.9, adult: Secondary | ICD-10-CM | POA: Diagnosis not present

## 2022-08-07 DIAGNOSIS — Z79899 Other long term (current) drug therapy: Secondary | ICD-10-CM | POA: Diagnosis not present

## 2022-08-11 DIAGNOSIS — Z029 Encounter for administrative examinations, unspecified: Secondary | ICD-10-CM

## 2022-08-22 ENCOUNTER — Ambulatory Visit (INDEPENDENT_AMBULATORY_CARE_PROVIDER_SITE_OTHER): Payer: BC Managed Care – PPO | Admitting: Gastroenterology

## 2022-08-22 ENCOUNTER — Encounter (INDEPENDENT_AMBULATORY_CARE_PROVIDER_SITE_OTHER): Payer: Self-pay | Admitting: Gastroenterology

## 2022-08-22 ENCOUNTER — Other Ambulatory Visit: Payer: Self-pay | Admitting: Family Medicine

## 2022-08-22 VITALS — BP 126/79 | HR 80 | Temp 97.3°F | Ht 73.0 in | Wt 235.9 lb

## 2022-08-22 DIAGNOSIS — I81 Portal vein thrombosis: Secondary | ICD-10-CM

## 2022-08-22 DIAGNOSIS — E1169 Type 2 diabetes mellitus with other specified complication: Secondary | ICD-10-CM

## 2022-08-22 DIAGNOSIS — K55069 Acute infarction of intestine, part and extent unspecified: Secondary | ICD-10-CM | POA: Diagnosis not present

## 2022-08-22 DIAGNOSIS — K580 Irritable bowel syndrome with diarrhea: Secondary | ICD-10-CM | POA: Diagnosis not present

## 2022-08-22 DIAGNOSIS — I8500 Esophageal varices without bleeding: Secondary | ICD-10-CM | POA: Diagnosis not present

## 2022-08-22 NOTE — Progress Notes (Signed)
Referring Provider: Claretta Fraise, MD Primary Care Physician:  Claretta Fraise, MD Primary GI Physician: castaneda   Chief Complaint  Patient presents with   Abdominal Pain    6 month follow up. Has concerns about pain on right side at times. Had hernia surgery dec 7th. Pain started after that. Has constipation and diarrhea. Asking for letter for disability hearing coming up.    HPI:   James [name] is a 56 y.o. 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 diff  Patient presenting today for follow up of abdominal pain secondary to Cavernomatous degeneration and Extensive intra-abdominal thrombosis, portal vein, SMV, splenic vein and intrahepatic veins   Last seen 03/02/22, at that time reported change in bowel habits, moving bowesl 2-7x/day, using miralax three times per week, also with some blood on occasion with his BMs. Using pepto bismol for diarrhea. carvedilol increased to 6.51m BID. Discussed IBS with low FODMAP diet, continue imodium PRN, follow up with hematology   Last labs 08/07/22 with hgb 15.3 ferritin 36.3, iron 237, TIBC 387, iron sat 61%  Present:  Patient reports that he has been noticing some pain to right side of abdomen since his abdominal surgery in December.Diarrhea comes and goes, he is using metamucil, usually with 1 BM per day. Diarrhea occurs a couple of times per week and will have up to 3-4 episodes of diarrhea with loose to watery stools. Appetite is improving.  He denies nausea or vomiting. He does note some dizziness with sudden movements or upon standing too fast. Denies melena. Has occasional toilet tissue hematochezia after BMs. He denies any weight loss, has actually gained weight.   He continues on Xarelto, is followed by  hematology, last saw them 08/07/22 with plans to continue on anticoagulation at this time. Labs done at that time with normal iron studies.  CT A/P with contrast: Oct 2022 interval development of cavernous transformation of the main portal vein secondary to thrombosis noted on prior CT scan of June 13, 2021. -Embolization coils noted within right hepatic lobe which was not identified on prior exam. -Large amount of stool seen in the sigmoid colon and rectum concerning for rectal impaction. -Sigmoid diverticulosis is noted without definite evidence of acute inflammation. -Mild broad necked fat containing periumbilical hernia is  abdominal wall construction for incisional hernia with mesh placement, Dr. GJohney Maineon 11/23/2021.   Last Colonoscopy:11/22/21 6 polyps in the cecum between 1 to 2 mm in size, 4 polyps in the sigmoid, ascending colon and cecum.  2 to 5 mm in size.  Diverticulosis.  Small internal hemorrhoids.  Pathology was consistent with tubular adenomas except for sigmoid which was hyperplastic Last Endoscopy:11/22/21 grade 1 esophageal varices, portal hypertensive gastropathy.  Recommendations:  Repeat colonoscopy July 2024 Repeat EGD December 2023  Past Medical History:  Diagnosis Date   Arthritis    Hx of exploratory laparotomy    enterectomy small intest. resection,celiotomy   Hyperlipidemia    Hypertension    Hypothyroidism    Pre-diabetes    Sleep apnea     Past Surgical History:  Procedure Laterality Date   BACK SURGERY     BIOPSY  11/22/2021   Procedure: BIOPSY;  Surgeon: CHarvel Quale MD;  [facility];  Service: Gastroenterology;;   COLONOSCOPY WITH PROPOFOL N/A 11/22/2021   Procedure: COLONOSCOPY  WITH PROPOFOL;  Surgeon: Montez Morita, Quillian Quince, MD;  Location: [facility];  Service: Gastroenterology;  Laterality: N/A;  8:45   ESOPHAGOGASTRODUODENOSCOPY (EGD) WITH PROPOFOL N/A 11/22/2021   Procedure: ESOPHAGOGASTRODUODENOSCOPY (EGD) WITH  PROPOFOL;  Surgeon: Harvel Quale, MD;  Location: [facility];  Service: Gastroenterology;  Laterality: N/A;   INCISIONAL HERNIA REPAIR N/A 11/23/2021   Procedure: OPEN COMPONENT SEPARATION AND MESH ABDOMINAL WALL RECONSTRUCTION FOR INCISIONAL HERNIA REPAIR, LYSIS OF ADHESIONS AND TRANSABDOMINAL PLANE BLOCK;  Surgeon: [name], MD;  Location: [facility];  Service: General;  Laterality: N/A;   POLYPECTOMY  11/22/2021   Procedure: POLYPECTOMY;  Surgeon: Harvel Quale, MD;  Location: [facility];  Service: Gastroenterology;;   TONSILLECTOMY      Current Outpatient Medications  Medication Sig Dispense Refill   atorvastatin (LIPITOR) 40 MG tablet TAKE ONE (1) TABLET EACH DAY 90 tablet 0   blood glucose meter kit and supplies Dispense based on patient and insurance preference. Use up to four times daily as directed. (FOR ICD-10 E10.9, E11.9). 1 each 0   carvedilol (COREG) 6.25 MG tablet Take 1 tablet (6.25 mg total) by mouth 2 (two) times daily with a meal. 180 tablet 3   fenofibrate 160 MG tablet TAKE ONE (1) TABLET EACH DAY 90 tablet 2   levothyroxine (SYNTHROID) 50 MCG tablet TAKE ONE (1) TABLET EACH DAY 90 tablet 2   sildenafil (REVATIO) 20 MG tablet Take 2-5 pills at once, orally, with each sexual encounter 50 tablet 5   testosterone cypionate (DEPOTESTOSTERONE CYPIONATE) 200 MG/ML injection INJECT 0.5ML IM EVERY 7 DAYS 3 mL 0   valsartan-hydrochlorothiazide (DIOVAN-HCT) 320-25 MG tablet TAKE ONE TABLET BY MOUTH EACH DAY FOR BLOOD PRESSURE 90 tablet 2   XARELTO 20 MG TABS tablet SMARTSIG:1 Tablet(s) By Mouth Every Evening     No current facility-administered medications for this visit.    Allergies as of 08/22/2022 - Review Complete 08/22/2022  Allergen Reaction Noted   Metformin and related  05/04/2021    Family History  Problem Relation Age of Onset   COPD Mother    Alzheimer's disease Father     Social History   Socioeconomic History   Marital status:  Married    Spouse name: Not on file   Number of children: Not on file   Years of education: Not on file   Highest education level: Not on file  Occupational History   Not on file  Tobacco Use   Smoking status: Never    Passive exposure: Never   Smokeless tobacco: Never  Vaping Use   Vaping Use: Never used  Substance and Sexual Activity   Alcohol use: No   Drug use: No   Sexual activity: Yes    Birth control/protection: None  Other Topics Concern   Not on file  Social History Narrative   Not on file   Social Determinants of Health   Financial Resource Strain: Not on file  Food Insecurity: Not on file  Transportation Needs: Not on file  Physical Activity: Not on file  Stress: Not on file  Social Connections: Not on file   Review of systems General: negative for malaise, night sweats, fever, chills, weight loss Neck: Negative for lumps, goiter, pain and significant neck swelling Resp: Negative for cough, wheezing, dyspnea at rest CV: Negative for chest pain, leg swelling, palpitations, orthopnea GI: denies melena, hematochezia, nausea, vomiting, constipation, dysphagia, odyonophagia, early satiety or unintentional weight loss. +diarrhea +R abd pain MSK: Negative for joint  pain or swelling, back pain, and muscle pain. Derm: Negative for itching or rash Psych: Denies depression, anxiety, memory loss, confusion. No homicidal or suicidal ideation.  Heme: Negative for prolonged bleeding, bruising easily, and swollen nodes. Endocrine: Negative for cold or heat intolerance, polyuria, polydipsia and goiter. Neuro: negative for tremor, gait imbalance, syncope and seizures. The remainder of the review of systems is noncontributory.  Physical Exam: BP 126/79 (BP Location: Right Arm, Patient Position: Sitting, Cuff Size: Large)   Pulse 80   Temp (!) 97.3 F (36.3 C) (Oral)   Ht '6\' 1"'  (1.854 m)   Wt 235 lb 14.4 oz (107 kg)   BMI 31.12 kg/m  General:   Alert and oriented. No  distress noted. Pleasant and cooperative.  Head:  Normocephalic and atraumatic. Eyes:  Conjuctiva clear without scleral icterus. Mouth:  Oral mucosa pink and moist. Good dentition. No lesions. Heart: Normal rate and rhythm, s1 and s2 heart sounds present.  Lungs: Clear lung sounds in all lobes. Respirations equal and unlabored. Abdomen:  +BS, soft, non-tender and non-distended. No rebound or guarding. No HSM or masses noted. Derm: No palmar erythema or jaundice Msk:  Symmetrical without gross deformities. Normal posture. Extremities:  Without edema. Neurologic:  Alert and  oriented x4 Psych:  Alert and cooperative. Normal mood and affect.  Invalid input(s): "6 MONTHS"   ASSESSMENT: James [name] is a 56 y.o. male presenting today for follow up.  Patient with history of abdominal pain secondary to Cavernomatous degeneration and Extensive intra-abdominal thrombosis, portal vein, SMV, splenic vein and intrahepatic veins and presence of grade 1 esophageal varices, maintained on xarelto and followed by hematology, recent visit with them in august with plans to continue anticoagulation for the foreseeable future. Patient continues to have some right sided abdominal pain at site of previous abdominal surgery as well as intermittent diarrhea, doing metamucil daily with some improvement in bowel habits. Occasional toilet tissue hematochezia, appetite is good and he has not had any weight loss. Carvedilol was increased at last OV, notably HR remains above goal in regards to his esophageal varices, at 80 BPM today though he tells me he is having some orthostatic dizziness/lightheadedness, therefore I am concerned for possible orthostatic hypotension secondary to beta blocker, I recommend patient check HR and BP at the same time each day x1 week and call us with the readings. Will plan for repeat EGD for esophageal varices in December 2023.    PLAN:  Check BP and HTN x1 week, pt to call with report 2.  Continue to follow with hematology  3. Repeat EGD in December for varices, ASA III 4. Low FODMAP diet   All questions were answered, patient verbalized understanding and is in agreement with plan as outlined above.    Follow Up: 6 months   Rilie Glanz L. Alver Sorrow, MSN, APRN, AGNP-C Adult-Gerontology Nurse Practitioner [facility]

## 2022-08-22 NOTE — Patient Instructions (Addendum)
-  Please check heart rate and BP for the next week, at the same time of day and let us know what these readings are, thereafter as ideally we would like your heart rate to be under 60 with your history of esophageal varices, but given episodes of dizziness, it is possible your BP is dropping too much at times -I am providing the low FODMAP diet as this can be helpful in avoiding triggers and finding commonly well tolerated foods with IBS. You can continue metamucil with good results -we will reach out to you once we have letter available for disability for you to pick up  If you are noticing worsening pain to right sided abdomen, please let us know as we may need to refer you back to the surgeon who did your procedure previously   Follow up 6 months

## 2022-09-04 ENCOUNTER — Encounter: Payer: Self-pay | Admitting: Family Medicine

## 2022-09-04 ENCOUNTER — Ambulatory Visit (INDEPENDENT_AMBULATORY_CARE_PROVIDER_SITE_OTHER): Payer: Self-pay | Admitting: Gastroenterology

## 2022-09-04 ENCOUNTER — Ambulatory Visit: Payer: BC Managed Care – PPO | Admitting: Family Medicine

## 2022-09-04 VITALS — BP 119/59 | HR 76 | Temp 97.7°F | Ht 73.0 in | Wt 234.4 lb

## 2022-09-04 DIAGNOSIS — I152 Hypertension secondary to endocrine disorders: Secondary | ICD-10-CM

## 2022-09-04 DIAGNOSIS — E1159 Type 2 diabetes mellitus with other circulatory complications: Secondary | ICD-10-CM | POA: Diagnosis not present

## 2022-09-04 DIAGNOSIS — E1169 Type 2 diabetes mellitus with other specified complication: Secondary | ICD-10-CM | POA: Diagnosis not present

## 2022-09-04 DIAGNOSIS — E039 Hypothyroidism, unspecified: Secondary | ICD-10-CM | POA: Diagnosis not present

## 2022-09-04 DIAGNOSIS — E119 Type 2 diabetes mellitus without complications: Secondary | ICD-10-CM

## 2022-09-04 DIAGNOSIS — K55069 Acute infarction of intestine, part and extent unspecified: Secondary | ICD-10-CM

## 2022-09-04 DIAGNOSIS — E785 Hyperlipidemia, unspecified: Secondary | ICD-10-CM

## 2022-09-04 DIAGNOSIS — E291 Testicular hypofunction: Secondary | ICD-10-CM

## 2022-09-04 LAB — BAYER DCA HB A1C WAIVED: HB A1C (BAYER DCA - WAIVED): 5.4 % (ref 4.8–5.6)

## 2022-09-04 MED ORDER — ATORVASTATIN CALCIUM 40 MG PO TABS: ORAL_TABLET | ORAL | 2 refills | Status: DC

## 2022-09-04 NOTE — Progress Notes (Signed)
Subjective:  Patient ID: James [name], male    DOB: 1966-10-23  Age: 56 y.o. MRN: [id number]  CC: Medical Management of Chronic Issues   HPI James [name] presents for  in for follow-up of elevated cholesterol. Doing well without complaints on current medication. Denies side effects of statin including myalgia and arthralgia and nausea. Currently no chest pain, shortness of breath or other cardiovascular related symptoms noted.  Testosterone injected weekly.   Pt. Also followed for GI for disability caused by Mesenteric thrombosis. Has signicant GI pain, diarrhea and constipation. Also has incontinence. Applied for disability for this.   follow-up on  thyroid. The patient has a history of hypothyroidism for many years. It has been stable recently. Pt. denies any change in  voice, loss of hair, heat or cold intolerance. Energy level has been adequate to good. Patient denies constipation and diarrhea. No myxedema. Medication is as noted below. Verified that pt is taking it daily on an empty stomach. Well tolerated.      09/04/2022    1:05 PM 03/02/2022    1:56 PM 11/14/2021    8:26 AM  Depression screen PHQ 2/9  Decreased Interest 0 0 0  Down, Depressed, Hopeless 0 0 0  PHQ - 2 Score 0 0 0    History James [name] has a past medical history of Arthritis, exploratory laparotomy, Hyperlipidemia, Hypertension, Hypothyroidism, Pre-diabetes, and Sleep apnea.   James [name] has a past surgical history that includes Back surgery; Tonsillectomy; Incisional hernia repair (N/A, 11/23/2021); Colonoscopy with propofol (N/A, 11/22/2021); Esophagogastroduodenoscopy (egd) with propofol (N/A, 11/22/2021); polypectomy (11/22/2021); and biopsy (11/22/2021).   His family history includes Alzheimer's disease in his father; COPD in his mother.James [name] reports that James [name] has never smoked. James [name] has never been exposed to tobacco smoke. James [name] has never used smokeless tobacco. James [name] reports that James [name] does not drink alcohol and does not use  drugs.    ROS Review of Systems  Constitutional:  Negative for fever.  Respiratory:  Negative for shortness of breath.   Cardiovascular:  Negative for chest pain.  Musculoskeletal:  Negative for arthralgias.  Skin:  Negative for rash.    Objective:  BP (!) 119/59   Pulse 76   Temp 97.7 F (36.5 C)   Ht 6' 1" (1.854 m)   Wt 234 lb 6.4 oz (106.3 kg)   SpO2 98%   BMI 30.93 kg/m   BP Readings from Last 3 Encounters:  09/04/22 (!) 119/59  08/22/22 126/79  03/02/22 (!) 114/53    Wt Readings from Last 3 Encounters:  09/04/22 234 lb 6.4 oz (106.3 kg)  08/22/22 235 lb 14.4 oz (107 kg)  03/02/22 221 lb 6.4 oz (100.4 kg)     Physical Exam Vitals reviewed.  Constitutional:      Appearance: James [name] is well-developed.  HENT:     Head: Normocephalic and atraumatic.     Right Ear: External ear normal.     Left Ear: External ear normal.     Mouth/Throat:     Pharynx: No oropharyngeal exudate or posterior oropharyngeal erythema.  Eyes:     Pupils: Pupils are equal, round, and reactive to light.  Cardiovascular:     Rate and Rhythm: Normal rate and regular rhythm.     Heart sounds: No murmur heard. Pulmonary:     Effort: No respiratory distress.     Breath sounds: Normal breath sounds.  Musculoskeletal:     Cervical back: Normal range of motion and neck supple.  Neurological:  Mental Status: James [name] is alert and oriented to person, place, and time.       Assessment & Plan:   James [name] was seen today for medical management of chronic issues.  Diagnoses and all orders for this visit:  Type 2 diabetes mellitus without complication, without long-term current use of insulin ([facility]) -     Bayer DCA Hb A1c Waived  Hyperlipidemia associated with type 2 diabetes mellitus ([facility]) -     Lipid panel -     atorvastatin (LIPITOR) 40 MG tablet; TAKE ONE (1) TABLET EACH DAY  Hypertension associated with diabetes ([place]) -     CBC with Differential/Platelet -     CMP14+EGFR  Acquired  hypothyroidism -     TSH + free T4  Hypogonadism in male -     Testosterone,Free and Total  Mesenteric thrombosis ([facility])       I am having James [name] maintain his blood glucose meter kit and supplies, Xarelto, fenofibrate, levothyroxine, valsartan-hydrochlorothiazide, sildenafil, carvedilol, testosterone cypionate, and atorvastatin.  Allergies as of 09/04/2022       Reactions   Metformin And Related    Loose bloody stools        Medication List        Accurate as of September 04, 2022  3:22 PM. If you have any questions, ask your nurse or doctor.          atorvastatin 40 MG tablet Commonly known as: LIPITOR TAKE ONE (1) TABLET EACH DAY   blood glucose meter kit and supplies Dispense based on patient and insurance preference. Use up to four times daily as directed. (FOR ICD-10 E10.9, E11.9).   carvedilol 6.25 MG tablet Commonly known as: Coreg Take 1 tablet (6.25 mg total) by mouth 2 (two) times daily with a meal.   fenofibrate 160 MG tablet TAKE ONE (1) TABLET EACH DAY   levothyroxine 50 MCG tablet Commonly known as: SYNTHROID TAKE ONE (1) TABLET EACH DAY   sildenafil 20 MG tablet Commonly known as: REVATIO Take 2-5 pills at once, orally, with each sexual encounter   testosterone cypionate 200 MG/ML injection Commonly known as: DEPOTESTOSTERONE CYPIONATE INJECT 0.5ML IM EVERY 7 DAYS   valsartan-hydrochlorothiazide 320-25 MG tablet Commonly known as: DIOVAN-HCT TAKE ONE TABLET BY MOUTH EACH DAY FOR BLOOD PRESSURE   Xarelto 20 MG Tabs tablet Generic drug: rivaroxaban SMARTSIG:1 Tablet(s) By Mouth Every Evening         Follow-up: Return in about 6 months (around 03/05/2023).  Claretta Fraise, M.D.

## 2022-09-04 NOTE — Patient Instructions (Signed)
Diabetes Mellitus and Foot Care Foot care is an important part of your health, especially when you have diabetes. Diabetes may cause you to have problems because of poor blood flow (circulation) to your feet and legs, which can cause your skin to: Become thinner and drier. Break more easily. Heal more slowly. Peel and crack. You may also have nerve damage (neuropathy) in your legs and feet, causing decreased feeling in them. This means that you may not notice minor injuries to your feet that could lead to more serious problems. Noticing and addressing any potential problems early is the best way to prevent future foot problems. How to care for your feet Foot hygiene  Wash your feet daily with warm water and mild soap. Do not use hot water. Then, pat your feet and the areas between your toes until they are completely dry. Do not soak your feet as this can dry your skin. Trim your toenails straight across. Do not dig under them or around the cuticle. File the edges of your nails with an emery board or nail file. Apply a moisturizing lotion or petroleum jelly to the skin on your feet and to dry, brittle toenails. Use lotion that does not contain alcohol and is unscented. Do not apply lotion between your toes. Shoes and socks Wear clean socks or stockings every day. Make sure they are not too tight. Do not wear knee-high stockings since they may decrease blood flow to your legs. Wear shoes that fit properly and have enough cushioning. Always look in your shoes before you put them on to be sure there are no objects inside. To break in new shoes, wear them for just a few hours a day. This prevents injuries on your feet. Wounds, scrapes, corns, and calluses  Check your feet daily for blisters, cuts, bruises, sores, and redness. If you cannot see the bottom of your feet, use a mirror or ask someone for help. Do not cut corns or calluses or try to remove them with medicine. If you find a minor scrape,  cut, or break in the skin on your feet, keep it and the skin around it clean and dry. You may clean these areas with mild soap and water. Do not clean the area with peroxide, alcohol, or iodine. If you have a wound, scrape, corn, or callus on your foot, look at it several times a day to make sure it is healing and not infected. Check for: Redness, swelling, or pain. Fluid or blood. Warmth. Pus or a bad smell. General tips Do not cross your legs. This may decrease blood flow to your feet. Do not use heating pads or hot water bottles on your feet. They may burn your skin. If you have lost feeling in your feet or legs, you may not know this is happening until it is too late. Protect your feet from hot and cold by wearing shoes, such as at the beach or on hot pavement. Schedule a complete foot exam at least once a year (annually) or more often if you have foot problems. Report any cuts, sores, or bruises to your health care provider immediately. Where to find more information American Diabetes Association: www.diabetes.org Association of Diabetes Care & Education Specialists: www.diabeteseducator.org Contact a health care provider if: You have a medical condition that increases your risk of infection and you have any cuts, sores, or bruises on your feet. You have an injury that is not healing. You have redness on your legs or feet. You   feel burning or tingling in your legs or feet. You have pain or cramps in your legs and feet. Your legs or feet are numb. Your feet always feel cold. You have pain around any toenails. Get help right away if: You have a wound, scrape, corn, or callus on your foot and: You have pain, swelling, or redness that gets worse. You have fluid or blood coming from the wound, scrape, corn, or callus. Your wound, scrape, corn, or callus feels warm to the touch. You have pus or a bad smell coming from the wound, scrape, corn, or callus. You have a fever. You have a red  line going up your leg. Summary Check your feet every day for blisters, cuts, bruises, sores, and redness. Apply a moisturizing lotion or petroleum jelly to the skin on your feet and to dry, brittle toenails. Wear shoes that fit properly and have enough cushioning. If you have foot problems, report any cuts, sores, or bruises to your health care provider immediately. Schedule a complete foot exam at least once a year (annually) or more often if you have foot problems. This information is not intended to replace advice given to you by your health care provider. Make sure you discuss any questions you have with your health care provider. Document Revised: 06/24/2020 Document Reviewed: 06/24/2020 Elsevier Patient Education  2023 [employer]vier Inc.  

## 2022-09-05 ENCOUNTER — Other Ambulatory Visit: Payer: BC Managed Care – PPO

## 2022-09-05 DIAGNOSIS — E1159 Type 2 diabetes mellitus with other circulatory complications: Secondary | ICD-10-CM | POA: Diagnosis not present

## 2022-09-05 DIAGNOSIS — I152 Hypertension secondary to endocrine disorders: Secondary | ICD-10-CM | POA: Diagnosis not present

## 2022-09-05 DIAGNOSIS — E785 Hyperlipidemia, unspecified: Secondary | ICD-10-CM | POA: Diagnosis not present

## 2022-09-05 DIAGNOSIS — E1169 Type 2 diabetes mellitus with other specified complication: Secondary | ICD-10-CM | POA: Diagnosis not present

## 2022-09-05 DIAGNOSIS — E039 Hypothyroidism, unspecified: Secondary | ICD-10-CM | POA: Diagnosis not present

## 2022-09-11 ENCOUNTER — Telehealth (INDEPENDENT_AMBULATORY_CARE_PROVIDER_SITE_OTHER): Payer: Self-pay | Admitting: Gastroenterology

## 2022-09-11 ENCOUNTER — Encounter (INDEPENDENT_AMBULATORY_CARE_PROVIDER_SITE_OTHER): Payer: Self-pay

## 2022-09-11 LAB — CBC WITH DIFFERENTIAL/PLATELET
Basophils Absolute: 0.1 10*3/uL (ref 0.0–0.2)
Basos: 1 %
EOS (ABSOLUTE): 0.2 10*3/uL (ref 0.0–0.4)
Eos: 3 %
Hematocrit: 46.1 % (ref 37.5–51.0)
Hemoglobin: 15.3 g/dL (ref 13.0–17.7)
Immature Grans (Abs): 0 10*3/uL (ref 0.0–0.1)
Immature Granulocytes: 0 %
Lymphocytes Absolute: 1.1 10*3/uL (ref 0.7–3.1)
Lymphs: 14 %
MCH: 29.1 pg (ref 26.6–33.0)
MCHC: 33.2 g/dL (ref 31.5–35.7)
MCV: 88 fL (ref 79–97)
Monocytes Absolute: 0.6 10*3/uL (ref 0.1–0.9)
Monocytes: 8 %
Neutrophils Absolute: 5.9 10*3/uL (ref 1.4–7.0)
Neutrophils: 74 %
Platelets: 120 10*3/uL — ABNORMAL LOW (ref 150–450)
RBC: 5.25 x10E6/uL (ref 4.14–5.80)
RDW: 15.3 % (ref 11.6–15.4)
WBC: 7.9 10*3/uL (ref 3.4–10.8)

## 2022-09-11 LAB — CMP14+EGFR
ALT: 32 IU/L (ref 0–44)
AST: 28 IU/L (ref 0–40)
Albumin/Globulin Ratio: 1.7 (ref 1.2–2.2)
Albumin: 4.3 g/dL (ref 3.8–4.9)
Alkaline Phosphatase: 59 IU/L (ref 44–121)
BUN/Creatinine Ratio: 12 (ref 9–20)
BUN: 18 mg/dL (ref 6–24)
Bilirubin Total: 0.8 mg/dL (ref 0.0–1.2)
CO2: 24 mmol/L (ref 20–29)
Calcium: 9.7 mg/dL (ref 8.7–10.2)
Chloride: 102 mmol/L (ref 96–106)
Creatinine, Ser: 1.46 mg/dL — ABNORMAL HIGH (ref 0.76–1.27)
Globulin, Total: 2.6 g/dL (ref 1.5–4.5)
Glucose: 111 mg/dL — ABNORMAL HIGH (ref 70–99)
Potassium: 3.9 mmol/L (ref 3.5–5.2)
Sodium: 140 mmol/L (ref 134–144)
Total Protein: 6.9 g/dL (ref 6.0–8.5)
eGFR: 56 mL/min/{1.73_m2} — ABNORMAL LOW (ref >59)

## 2022-09-11 LAB — TESTOSTERONE,FREE AND TOTAL
Testosterone, Free: 27.3 pg/mL — ABNORMAL HIGH (ref 7.2–24.0)
Testosterone: 1417 ng/dL — ABNORMAL HIGH (ref 264–916)

## 2022-09-11 LAB — LIPID PANEL
Chol/HDL Ratio: 2.9 ratio (ref 0.0–5.0)
Cholesterol, Total: 70 mg/dL — ABNORMAL LOW (ref 100–199)
HDL: 24 mg/dL — ABNORMAL LOW (ref >39)
LDL Chol Calc (NIH): 20 mg/dL (ref 0–99)
Triglycerides: 155 mg/dL — ABNORMAL HIGH (ref 0–149)
VLDL Cholesterol Cal: 26 mg/dL (ref 5–40)

## 2022-09-11 LAB — TSH+FREE T4
Free T4: 1.05 ng/dL (ref 0.82–1.77)
TSH: 3.42 u[IU]/mL (ref 0.450–4.500)

## 2022-09-11 NOTE — Telephone Encounter (Signed)
BP is stable. Couple of readings that are on the softer side. With patient reporting intermittent lightheadedness at his last OV with [facility]. Looks like Dr. Jenetta Downer recommended decreasing carvedilol to 3.125 mg though I am not sure if this recommendation was communicated with the patient.  Please find out what dose of carvedilol he is taking currently and if he is having any ongoing lightheadedness.

## 2022-09-11 NOTE — Telephone Encounter (Signed)
Patient left voice mail message stating he has some numbers for [facility] - please advise- ph# [phone]

## 2022-09-11 NOTE — Telephone Encounter (Signed)
Patient called today states he was told to call with update in one week on his bp and his pulses per [facility] ov 08/22/2022. Patient also states he is in need of his disability letter also. I advised that Vikki Ports was not currently in the office and that we could not get it to him any earlier than next week upon her return.   09/08: 115/68 p 78 09/09: 121/75 p 71 09/10: 110/64 p 72 09/11: 113/65 p 78 09/12: 116/70 p 79 09/13: 115/72 p72 09/14: 104/58 p 75 09/15: 105/64 p 73 09/18: 117/73 p 75  08/22/2022 ov Instructions per [place] at [facility]  -Please check heart rate and BP for the next week, at the same time of day and let us know what these readings are, thereafter as ideally we would like your heart rate to be under 60 with your history of esophageal varices, but given episodes of dizziness, it is possible your BP is dropping too much at times    Please advise. Thanks,

## 2022-09-12 NOTE — Telephone Encounter (Signed)
Recommend cutting carvedilol in half to equal  3.125 mg daily as Dr. Jenetta Downer previously recommended. He should continue to keep a log of his BP and HR as before and call in 1 week with how he is doing and his log. Further recommendations per [facility] when she returns.

## 2022-09-12 NOTE — Telephone Encounter (Signed)
James [name], Please clarify if the 3.125 mg is daily or bid as the patient had been 6.25 bid. Please advise. Thanks

## 2022-09-12 NOTE — Telephone Encounter (Signed)
Patient made aware to take 3.125 bid.

## 2022-09-12 NOTE — Telephone Encounter (Signed)
I called and left a message asked that patient please return call.  ?

## 2022-09-12 NOTE — Telephone Encounter (Signed)
Per Patient he is taking Carvedilol 6.25 Bid and he does have issues with dizziness upon standing or stooping.

## 2022-09-12 NOTE — Telephone Encounter (Signed)
Carvedilol 3.125 mg BID. Thank you for double checking on this.

## 2022-09-21 NOTE — Telephone Encounter (Signed)
Per patient he thinks the dizziness has gotten worse, and heart rate has increased also since increasing the coreg. He says he is in need of the note to state he is still having issues with controlling his bowels and he has fatigue and dizziness.

## 2022-09-21 NOTE — Telephone Encounter (Signed)
Patient was told to call in with is bp and hr levels, he also is inquiring about a disability letter we told him he could get.   09/28 120/71 p 82 09/29 111/ 52 p 89 09/30 106/63 p 78  10/01 107/61 p 77  10/02 114/74 p 80 10/03 118/72 p 82  10/04 116/65 p 76

## 2022-09-25 NOTE — Telephone Encounter (Signed)
Yes, heart rates are listed below with the bp's below.

## 2022-09-26 ENCOUNTER — Other Ambulatory Visit (INDEPENDENT_AMBULATORY_CARE_PROVIDER_SITE_OTHER): Payer: Self-pay | Admitting: Gastroenterology

## 2022-09-26 MED ORDER — NADOLOL 20 MG PO TABS: 20.0000 mg | ORAL_TABLET | Freq: Every day | ORAL | 1 refills | Status: DC

## 2022-09-27 NOTE — Telephone Encounter (Signed)
Patient made aware of all. He asked if his letter was written. Please advise.

## 2022-09-27 NOTE — Telephone Encounter (Signed)
I spoke with the patient advised to get the forms he needs sent to [place]. He says he will reach out to his attorney to see if there are forms he can send over for disability.

## 2022-10-02 ENCOUNTER — Other Ambulatory Visit (INDEPENDENT_AMBULATORY_CARE_PROVIDER_SITE_OTHER): Payer: Self-pay

## 2022-10-02 ENCOUNTER — Telehealth (INDEPENDENT_AMBULATORY_CARE_PROVIDER_SITE_OTHER): Payer: Self-pay

## 2022-10-02 DIAGNOSIS — I81 Portal vein thrombosis: Secondary | ICD-10-CM

## 2022-10-02 DIAGNOSIS — I152 Hypertension secondary to endocrine disorders: Secondary | ICD-10-CM

## 2022-10-02 DIAGNOSIS — K580 Irritable bowel syndrome with diarrhea: Secondary | ICD-10-CM

## 2022-10-02 DIAGNOSIS — I8510 Secondary esophageal varices without bleeding: Secondary | ICD-10-CM

## 2022-10-02 DIAGNOSIS — I8500 Esophageal varices without bleeding: Secondary | ICD-10-CM

## 2022-10-02 MED ORDER — CARVEDILOL 3.125 MG PO TABS: 3.1250 mg | ORAL_TABLET | Freq: Two times a day (BID) | ORAL | 5 refills | Status: DC

## 2022-10-02 NOTE — Telephone Encounter (Signed)
Patient called today stating the Nadolol is causing him to have sickness on stomach dizziness, feeling of passing out,fatigued.He last took the nadolol yesterday and has not taken it today.He says yesterday his bp was in the 95'Q systolic, he did not remember what diastolic was. Pulse was 70.  He says he can not tolerate this  He would like to go back on Coreg 6.125. Please advise.

## 2022-10-02 NOTE — Telephone Encounter (Signed)
Sent in 3.125 mg bid to the drug store in [place], [place]. Patient aware.

## 2022-10-19 ENCOUNTER — Telehealth: Payer: Self-pay | Admitting: Internal Medicine

## 2022-10-19 NOTE — Telephone Encounter (Signed)
James [name] with [employer] called re: records being sent to them.... he sent a request on 10/20.Marland KitchenMarland KitchenMarland Kitchen phone # is [phone] ex. [phone]

## 2022-11-03 ENCOUNTER — Encounter (INDEPENDENT_AMBULATORY_CARE_PROVIDER_SITE_OTHER): Payer: Self-pay | Admitting: *Deleted

## 2022-12-02 ENCOUNTER — Other Ambulatory Visit: Payer: Self-pay | Admitting: Family Medicine

## 2022-12-15 ENCOUNTER — Other Ambulatory Visit: Payer: Self-pay | Admitting: Family Medicine

## 2022-12-15 DIAGNOSIS — E039 Hypothyroidism, unspecified: Secondary | ICD-10-CM

## 2022-12-22 ENCOUNTER — Encounter: Payer: Self-pay | Admitting: Family Medicine

## 2022-12-22 ENCOUNTER — Telehealth (INDEPENDENT_AMBULATORY_CARE_PROVIDER_SITE_OTHER): Payer: 59 | Admitting: Family Medicine

## 2022-12-22 ENCOUNTER — Telehealth: Payer: Self-pay | Admitting: *Deleted

## 2022-12-22 DIAGNOSIS — J0140 Acute pansinusitis, unspecified: Secondary | ICD-10-CM | POA: Diagnosis not present

## 2022-12-22 DIAGNOSIS — H9203 Otalgia, bilateral: Secondary | ICD-10-CM | POA: Diagnosis not present

## 2022-12-22 MED ORDER — AMOXICILLIN-POT CLAVULANATE 875-125 MG PO TABS: 1.0000 | ORAL_TABLET | Freq: Two times a day (BID) | ORAL | 0 refills | Status: AC

## 2022-12-22 NOTE — Progress Notes (Signed)
   Virtual Visit  Note Due to COVID-19 pandemic this visit was conducted virtually. This visit type was conducted due to national recommendations for restrictions regarding the COVID-19 Pandemic (e.g. social distancing, sheltering in place) in an effort to limit this patient's exposure and mitigate transmission in our community. All issues noted in this document were discussed and addressed.  A physical exam was not performed with this format.  I connected with James [name] on 12/22/22 at 13:52 by telephone and verified that I am speaking with the correct person using two identifiers. James [name] is currently located at home and no one is currently with him during the visit. The provider, Gwenlyn Perking, FNP is located in their office at time of visit.  I discussed the limitations, risks, security and privacy concerns of performing an evaluation and management service by telephone and the availability of in person appointments. I also discussed with the patient that there may be a patient responsible charge related to this service. The patient expressed understanding and agreed to proceed.  CC: congestion  History and Present Illness:  HPI Patient was unable to connect via video so visit was conducted via telephone audio only.   James [name] reports sinus congestion for 3 weeks. This has been worsening over the last week. He also reports bilateral ear pain for the last week that has been worsening. 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 He has not checked his temperature, but reports a new subjective fever. Denies hearing loss.    ROS Negative unless specially indicated above in HPI.  Observations/Objective: Alert and oriented x 3. Able to speak in full sentences without difficulty.    Assessment and Plan: Diagnoses and all orders for this visit:  Acute non-recurrent pansinusitis Augmentin as below. Discussed symptomatic care and return precautions.  -      amoxicillin-clavulanate (AUGMENTIN) 875-125 MG tablet; Take 1 tablet by mouth 2 (two) times daily for 10 days.  Acute otalgia, bilateral Hx of TM rupture. Will treat empirically with Augmentin give hx.  -     amoxicillin-clavulanate (AUGMENTIN) 875-125 MG tablet; Take 1 tablet by mouth 2 (two) times daily for 10 days.   Follow Up Instructions: As needed.     I discussed the assessment and treatment plan with the patient. The patient was provided an opportunity to ask questions and all were answered. The patient agreed with the plan and demonstrated an understanding of the instructions.   The patient was advised to call back or seek an in-person evaluation if the symptoms worsen or if the condition fails to improve as anticipated.  The above assessment and management plan was discussed with the patient. The patient verbalized understanding of and has agreed to the management plan. Patient is aware to call the clinic if symptoms persist or worsen. Patient is aware when to return to the clinic for a follow-up visit. Patient educated on when it is appropriate to go to the emergency department.   Time call ended:  1403  I provided 11 minutes of  non face-to-face time during this encounter.    Gwenlyn Perking, FNP

## 2022-12-22 NOTE — Telephone Encounter (Signed)
During pt's televisit with James [name] today pt requested CPAP supplies which he says DR Livia Snellen takes care of for him be sent to [facility]. Please advise.

## 2022-12-22 NOTE — Telephone Encounter (Signed)
I don't usually do CPAP for anyone. That should come from their sleep specialist.

## 2022-12-25 ENCOUNTER — Other Ambulatory Visit: Payer: Self-pay | Admitting: Family Medicine

## 2022-12-25 DIAGNOSIS — G4733 Obstructive sleep apnea (adult) (pediatric): Secondary | ICD-10-CM

## 2022-12-25 NOTE — Telephone Encounter (Signed)
Patient reports he has not seen sleep doctor is 20 years. PCP has always ordered. Patient requests prescription be sent this time as well as referral to a sleep doctor if he will need to start getting supplies from them.

## 2022-12-25 NOTE — Telephone Encounter (Signed)
You have been ordering for him.

## 2022-12-25 NOTE — Telephone Encounter (Signed)
Be that as it may. I may have done it to help him out once or twice, but he needs to go through his sleep doctor. Is there a reason he cannot?

## 2022-12-26 LAB — HM DIABETES EYE EXAM

## 2022-12-27 NOTE — Telephone Encounter (Signed)
Pt calling to check up on refill status. Please call back

## 2022-12-27 NOTE — Telephone Encounter (Signed)
Refaxed order  

## 2023-01-01 ENCOUNTER — Telehealth: Payer: Self-pay | Admitting: Family Medicine

## 2023-01-01 NOTE — Telephone Encounter (Signed)
Disregard message. Pt scheduled an apt.

## 2023-01-02 ENCOUNTER — Ambulatory Visit: Payer: 59 | Admitting: Family Medicine

## 2023-01-02 ENCOUNTER — Other Ambulatory Visit: Payer: Self-pay | Admitting: Family Medicine

## 2023-01-02 ENCOUNTER — Encounter: Payer: Self-pay | Admitting: Family Medicine

## 2023-01-02 VITALS — BP 125/72 | HR 76 | Temp 98.2°F | Ht 73.0 in | Wt 245.4 lb

## 2023-01-02 DIAGNOSIS — H66009 Acute suppurative otitis media without spontaneous rupture of ear drum, unspecified ear: Secondary | ICD-10-CM | POA: Diagnosis not present

## 2023-01-02 DIAGNOSIS — J0100 Acute maxillary sinusitis, unspecified: Secondary | ICD-10-CM | POA: Diagnosis not present

## 2023-01-02 MED ORDER — BETAMETHASONE SOD PHOS & ACET 6 (3-3) MG/ML IJ SUSP
6.0000 mg | Freq: Once | INTRAMUSCULAR | Status: AC
  Administered 2023-01-02: 6 mg via INTRAMUSCULAR

## 2023-01-02 MED ORDER — AMOXICILLIN-POT CLAVULANATE 875-125 MG PO TABS: 1.0000 | ORAL_TABLET | Freq: Two times a day (BID) | ORAL | 0 refills | Status: DC

## 2023-01-02 NOTE — Progress Notes (Signed)
Chief Complaint  Patient presents with   EAR PRESSURE    HPI  Patient presents today for 1 week of increasing discomfort and pressure in each ear.  The left is worse than the right.  He had some sinus infection treated recently.  Chest has cleared but he still got some runny stuffy nose from that.  Otherwise symptoms in the chest have resolved including his cough.  He says he is still having some night sweats.  His concern for ear infection is that his left TM is perforated in the past.  He does not want that to happen again.  PMH: Smoking status noted ROS: Per HPI  Objective: BP 125/72   Pulse 76   Temp 98.2 F (36.8 C)   Ht '6\' 1"'$  (1.854 m)   Wt 245 lb 6.4 oz (111.3 kg)   SpO2 96%   BMI 32.38 kg/m  Gen: NAD, alert, cooperative with exam HEENT: NCAT, EOMI, PERRL the right TM has fluid level.  The left has purulent matter behind the TM CV: RRR, good S1/S2, no murmur Resp: CTABL, no wheezes, non-labored Abd: SNTND, BS present, no guarding or organomegaly Ext: No edema, warm Neuro: Alert and oriented, No gross deficits  Assessment and plan:  1. Acute maxillary sinusitis, recurrence not specified   2. Acute suppurative otitis media without spontaneous rupture of ear drum, recurrence not specified, unspecified laterality     Meds ordered this encounter  Medications   amoxicillin-clavulanate (AUGMENTIN) 875-125 MG tablet    Sig: Take 1 tablet by mouth 2 (two) times daily. Take all of this medication    Dispense:  20 tablet    Refill:  0   betamethasone acetate-betamethasone sodium phosphate (CELESTONE) injection 6 mg    No orders of the defined types were placed in this encounter.   Follow up as needed.  Claretta Fraise, MD

## 2023-01-04 ENCOUNTER — Telehealth (INDEPENDENT_AMBULATORY_CARE_PROVIDER_SITE_OTHER): Payer: Self-pay | Admitting: *Deleted

## 2023-01-04 NOTE — Telephone Encounter (Signed)
Can hold for 48 hours Thanks

## 2023-01-04 NOTE — Telephone Encounter (Signed)
Pt is due for repeat EGD, ASA 3, screen varices. Pt on xarelto. Please advise if pt can just hold or will need clearance

## 2023-01-05 NOTE — Telephone Encounter (Signed)
LMOVM to call back 

## 2023-01-08 ENCOUNTER — Encounter: Payer: Self-pay | Admitting: *Deleted

## 2023-01-08 NOTE — Telephone Encounter (Signed)
Pt has been scheduled for 02/06/23. Instructions mailed

## 2023-01-09 ENCOUNTER — Encounter (INDEPENDENT_AMBULATORY_CARE_PROVIDER_SITE_OTHER): Payer: Self-pay | Admitting: Gastroenterology

## 2023-01-23 ENCOUNTER — Encounter: Payer: Self-pay | Admitting: Family Medicine

## 2023-01-23 ENCOUNTER — Telehealth: Payer: 59 | Admitting: Family Medicine

## 2023-01-23 DIAGNOSIS — J0141 Acute recurrent pansinusitis: Secondary | ICD-10-CM

## 2023-01-23 MED ORDER — CEFDINIR 300 MG PO CAPS: 300.0000 mg | ORAL_CAPSULE | Freq: Two times a day (BID) | ORAL | 0 refills | Status: DC

## 2023-01-23 NOTE — Progress Notes (Signed)
Virtual Visit via telephone Note Due to COVID-19 pandemic this visit was conducted virtually. This visit type was conducted due to national recommendations for restrictions regarding the COVID-19 Pandemic (e.g. social distancing, sheltering in place) in an effort to limit this patient's exposure and mitigate transmission in our community. All issues noted in this document were discussed and addressed.  A physical exam was not performed with this format.   I connected with James [name] on 01/23/2023 at 1300 by telephone and verified that I am speaking with the correct person using two identifiers. James [name] is currently located at home and patient is currently with them during visit. The provider, Monia Pouch, FNP is located in their office at time of visit.  I discussed the limitations, risks, security and privacy concerns of performing an evaluation and management service by virtual visit and the availability of in person appointments. I also discussed with the patient that there may be a patient responsible charge related to this service. The patient expressed understanding and agreed to proceed.  Subjective:  Patient ID: James [name], male    DOB: May 26, 1966, 57 y.o.   MRN: [id number]  Chief Complaint:  Sinus Problem   HPI: James [name] is a 57 y.o. male presenting on 01/23/2023 for Sinus Problem   Pt presents today with recurrent sinus pressure and pain with nasal congestion and ear fullness. He was on Augment last month, states symptoms resolved slightly for a few days and then returned.   Sinus Problem This is a recurrent problem. The current episode started 1 to 4 weeks ago. The problem has been gradually worsening since onset. The pain is moderate. Associated symptoms include congestion, coughing, ear pain, headaches and sinus pressure. Pertinent negatives include no chills, diaphoresis, hoarse voice, neck pain, shortness of breath, sneezing, sore throat or swollen glands.  Past treatments include oral decongestants. The treatment provided no relief.     Relevant past medical, surgical, family, and social history reviewed and updated as indicated.  Allergies and medications reviewed and updated.   Past Medical History:  Diagnosis Date   Arthritis    Hx of exploratory laparotomy    enterectomy small intest. resection,celiotomy   Hyperlipidemia    Hypertension    Hypothyroidism    Pre-diabetes    Sleep apnea     Past Surgical History:  Procedure Laterality Date   BACK SURGERY     BIOPSY  11/22/2021   Procedure: BIOPSY;  Surgeon: Harvel Quale, MD;  Location: [facility];  Service: Gastroenterology;;   COLONOSCOPY WITH PROPOFOL N/A 11/22/2021   Procedure: COLONOSCOPY WITH PROPOFOL;  Surgeon: Harvel Quale, MD;  Location: [facility];  Service: Gastroenterology;  Laterality: N/A;  8:45   ESOPHAGOGASTRODUODENOSCOPY (EGD) WITH PROPOFOL N/A 11/22/2021   Procedure: ESOPHAGOGASTRODUODENOSCOPY (EGD) WITH PROPOFOL;  Surgeon: Harvel Quale, MD;  Location: [facility];  Service: Gastroenterology;  Laterality: N/A;   INCISIONAL HERNIA REPAIR N/A 11/23/2021   Procedure: OPEN COMPONENT SEPARATION AND MESH ABDOMINAL WALL RECONSTRUCTION FOR INCISIONAL HERNIA REPAIR, LYSIS OF ADHESIONS AND TRANSABDOMINAL PLANE BLOCK;  Surgeon: [name], MD;  Location: [facility];  Service: General;  Laterality: N/A;   POLYPECTOMY  11/22/2021   Procedure: POLYPECTOMY;  Surgeon: Harvel Quale, MD;  Location: [facility];  Service: Gastroenterology;;   TONSILLECTOMY      Social History   Socioeconomic History   Marital status: Married    Spouse name: Not on file   Number of children:  Not on file   Years of education: Not on file   Highest education level: Not on file  Occupational History   Not on file  Tobacco Use   Smoking status: Never    Passive exposure: Never   Smokeless tobacco: Never  Vaping Use   Vaping Use: Never  used  Substance and Sexual Activity   Alcohol use: No   Drug use: No   Sexual activity: Yes    Birth control/protection: None  Other Topics Concern   Not on file  Social History Narrative   Not on file   Social Determinants of Health   Financial Resource Strain: Not on file  Food Insecurity: Not on file  Transportation Needs: Not on file  Physical Activity: Not on file  Stress: Not on file  Social Connections: Not on file  Intimate Partner Violence: Not on file    Outpatient Encounter Medications as of 01/23/2023  Medication Sig   cefdinir (OMNICEF) 300 MG capsule Take 1 capsule (300 mg total) by mouth 2 (two) times daily. 1 po BID   atorvastatin (LIPITOR) 40 MG tablet TAKE ONE (1) TABLET EACH DAY   blood glucose meter kit and supplies Dispense based on patient and insurance preference. Use up to four times daily as directed. (FOR ICD-10 E10.9, E11.9).   carvedilol (COREG) 3.125 MG tablet Take 1 tablet (3.125 mg total) by mouth 2 (two) times daily with a meal.   fenofibrate 160 MG tablet TAKE ONE (1) TABLET EACH DAY   levothyroxine (SYNTHROID) 50 MCG tablet TAKE ONE (1) TABLET EACH DAY   sildenafil (REVATIO) 20 MG tablet Take 2-5 pills at once, orally, with each sexual encounter   testosterone cypionate (DEPOTESTOSTERONE CYPIONATE) 200 MG/ML injection INJECT 0.5ML IM EVERY 7 DAYS   valsartan-hydrochlorothiazide (DIOVAN-HCT) 320-25 MG tablet TAKE ONE TABLET BY MOUTH EACH DAY FOR BLOOD PRESSURE   XARELTO 20 MG TABS tablet SMARTSIG:1 Tablet(s) By Mouth Every Evening   [DISCONTINUED] amoxicillin-clavulanate (AUGMENTIN) 875-125 MG tablet Take 1 tablet by mouth 2 (two) times daily. Take all of this medication   No facility-administered encounter medications on file as of 01/23/2023.    Allergies  Allergen Reactions   Metformin And Related     Loose bloody stools    Review of Systems  Constitutional:  Negative for activity change, appetite change, chills, diaphoresis, fatigue,  fever and unexpected weight change.  HENT:  Positive for congestion, ear pain, postnasal drip, rhinorrhea, sinus pressure and sinus pain. Negative for dental problem, drooling, ear discharge, facial swelling, hearing loss, hoarse voice, mouth sores, nosebleeds, sneezing, sore throat, tinnitus, trouble swallowing and voice change.   Eyes:  Negative for photophobia and visual disturbance.  Respiratory:  Positive for cough. Negative for shortness of breath.   Cardiovascular:  Negative for chest pain, palpitations and leg swelling.  Gastrointestinal:  Negative for abdominal pain, nausea and vomiting.  Endocrine: Negative for polydipsia, polyphagia and polyuria.  Genitourinary:  Negative for decreased urine volume and difficulty urinating.  Musculoskeletal:  Negative for arthralgias, myalgias and neck pain.  Neurological:  Positive for headaches. Negative for dizziness, tremors, seizures, syncope, facial asymmetry, speech difficulty, weakness, light-headedness and numbness.  Psychiatric/Behavioral:  Negative for confusion.   All other systems reviewed and are negative.        Observations/Objective: No vital signs or physical exam, this was a virtual health encounter.  Pt alert and oriented, answers all questions appropriately, and able to speak in full sentences.    Assessment and Plan: Mannie  was seen today for sinus problem.  Diagnoses and all orders for this visit:  Acute recurrent pansinusitis Recurrent symptoms. Was treated with Augmentin last month and had relieve for a few days and then symptoms returned. Will treat with cefdinir. Continue flonase. If persistent or recurrent, will refer to ENT. -     cefdinir (OMNICEF) 300 MG capsule; Take 1 capsule (300 mg total) by mouth 2 (two) times daily. 1 po BID     Follow Up Instructions: Return if symptoms worsen or fail to improve.    I discussed the assessment and treatment plan with the patient. The patient was provided an  opportunity to ask questions and all were answered. The patient agreed with the plan and demonstrated an understanding of the instructions.   The patient was advised to call back or seek an in-person evaluation if the symptoms worsen or if the condition fails to improve as anticipated.  The above assessment and management plan was discussed with the patient. The patient verbalized understanding of and has agreed to the management plan. Patient is aware to call the clinic if they develop any new symptoms or if symptoms persist or worsen. Patient is aware when to return to the clinic for a follow-up visit. Patient educated on when it is appropriate to go to the emergency department.    I provided 15 minutes of time during this telephone encounter.   Monia Pouch, FNP-C [facility] [address] [place], [place] [address] [phone] 01/23/2023

## 2023-01-25 ENCOUNTER — Telehealth: Payer: Self-pay | Admitting: Family Medicine

## 2023-01-25 ENCOUNTER — Other Ambulatory Visit: Payer: Self-pay | Admitting: Family Medicine

## 2023-01-25 DIAGNOSIS — U071 COVID-19: Secondary | ICD-10-CM

## 2023-01-25 MED ORDER — MOLNUPIRAVIR EUA 200MG CAPSULE: 4.0000 | ORAL_CAPSULE | Freq: Two times a day (BID) | ORAL | 0 refills | Status: AC

## 2023-01-25 NOTE — Telephone Encounter (Signed)
Patient aware.

## 2023-01-31 NOTE — Patient Instructions (Signed)
Cameron CHARLI CURLESS  01/31/2023     @[email or web address]$ @   Your procedure is scheduled on  02/06/2023.   Report to [facility] at  1245  P.M.   Call this number if you have problems the morning of surgery:  [phone]  If you experience any cold or flu symptoms such as cough, fever, chills, shortness of breath, etc. between now and your scheduled surgery, please notify us at the above number.   Remember:  Follow the diet instructions given to you by the office.         Your last dose of xarelto should be on 02/03/2023.    Take these medicines the morning of surgery with A SIP OF WATER               carvedilol, finofibrate, levothyroxine.    Do not wear jewelry, make-up or nail polish.  Do not wear lotions, powders, or perfumes, or deodorant.  Do not shave 48 hours prior to surgery.  Men may shave face and neck.  Do not bring valuables to the hospital.  [facility] is not responsible for any belongings or valuables.  Contacts, dentures or bridgework may not be worn into surgery.  Leave your suitcase in the car.  After surgery it may be brought to your room.  For patients admitted to the hospital, discharge time will be determined by your treatment team.  Patients discharged the day of surgery will not be allowed to drive home and must have someone with them for 24 hours.    Special instructions:   DO NOT smoke tobacco or vape for 24 hours before your procedure.  Please read over the following fact sheets that you were given. Anesthesia Post-op Instructions and Care and Recovery After Surgery      Upper Endoscopy, Adult, Care After After the procedure, it is common to have a sore throat. It is also common to have: Mild stomach pain or discomfort. Bloating. Nausea. Follow these instructions at home: The instructions below may help you care for yourself at home. Your health care provider may give you more instructions. If you have questions, ask your health care  provider. If you were given a sedative during the procedure, it can affect you for several hours. Do not drive or operate machinery until your health care provider says that it is safe. If you will be going home right after the procedure, plan to have a responsible adult: Take you home from the hospital or clinic. You will not be allowed to drive. Care for you for the time you are told. Follow instructions from your health care provider about what you may eat and drink. Return to your normal activities as told by your health care provider. Ask your health care provider what activities are safe for you. Take over-the-counter and prescription medicines only as told by your health care provider. Contact a health care provider if you: Have a sore throat that lasts longer than one day. Have trouble swallowing. Have a fever. Get help right away if you: Vomit blood or your vomit looks like coffee grounds. Have bloody, black, or tarry stools. Have a very bad sore throat or you cannot swallow. Have difficulty breathing or very bad pain in your chest or abdomen. These symptoms may be an emergency. Get help right away. Call 911. Do not wait to see if the symptoms will go away. Do not drive yourself to the hospital. Summary After the procedure, it  is common to have a sore throat, mild stomach discomfort, bloating, and nausea. If you were given a sedative during the procedure, it can affect you for several hours. Do not drive until your health care provider says that it is safe. Follow instructions from your health care provider about what you may eat and drink. Return to your normal activities as told by your health care provider. This information is not intended to replace advice given to you by your health care provider. Make sure you discuss any questions you have with your health care provider. Document Revised: 03/15/2022 Document Reviewed: 03/15/2022 Elsevier Patient Education  [place] After The following information offers guidance on how to care for yourself after your procedure. Your health care provider may also give you more specific instructions. If you have problems or questions, contact your health care provider. What can I expect after the procedure? After the procedure, it is common to have: Tiredness. Little or no memory about what happened during or after the procedure. Impaired judgment when it comes to making decisions. Nausea or vomiting. Some trouble with balance. Follow these instructions at home: For the time period you were told by your health care provider:  Rest. Do not participate in activities where you could fall or become injured. Do not drive or use machinery. Do not drink alcohol. Do not take sleeping pills or medicines that cause drowsiness. Do not make important decisions or sign legal documents. Do not take care of children on your own. Medicines Take over-the-counter and prescription medicines only as told by your health care provider. If you were prescribed antibiotics, take them as told by your health care provider. Do not stop using the antibiotic even if you start to feel better. Eating and drinking Follow instructions from your health care provider about what you may eat and drink. Drink enough fluid to keep your urine pale yellow. If you vomit: Drink clear fluids slowly and in small amounts as you are able. Clear fluids include water, ice chips, low-calorie sports drinks, and fruit juice that has water added to it (diluted fruit juice). Eat light and bland foods in small amounts as you are able. These foods include bananas, applesauce, rice, lean meats, toast, and crackers. General instructions  Have a responsible adult stay with you for the time you are told. It is important to have someone help care for you until you are awake and alert. If you have sleep apnea, surgery and some medicines  can increase your risk for breathing problems. Follow instructions from your health care provider about wearing your sleep device: When you are sleeping. This includes during daytime naps. While taking prescription pain medicines, sleeping medicines, or medicines that make you drowsy. Do not use any products that contain nicotine or tobacco. These products include cigarettes, chewing tobacco, and vaping devices, such as e-cigarettes. If you need help quitting, ask your health care provider. Contact a health care provider if: You feel nauseous or vomit every time you eat or drink. You feel light-headed. You are still sleepy or having trouble with balance after 24 hours. You get a rash. You have a fever. You have redness or swelling around the IV site. Get help right away if: You have trouble breathing. You have new confusion after you get home. These symptoms may be an emergency. Get help right away. Call 911. Do not wait to see if the symptoms will go away. Do not drive yourself to the  hospital. This information is not intended to replace advice given to you by your health care provider. Make sure you discuss any questions you have with your health care provider. Document Revised: 05/01/2022 Document Reviewed: 05/01/2022 Elsevier Patient Education  [place].

## 2023-02-01 ENCOUNTER — Other Ambulatory Visit: Payer: Self-pay

## 2023-02-01 ENCOUNTER — Encounter (HOSPITAL_COMMUNITY): Payer: Self-pay

## 2023-02-01 ENCOUNTER — Encounter (HOSPITAL_COMMUNITY)
Admission: RE | Admit: 2023-02-01 | Discharge: 2023-02-01 | Disposition: A | Discharge status: Home / Self Care | Payer: 59 | Source: Ambulatory Visit | Attending: Gastroenterology | Admitting: Gastroenterology

## 2023-02-01 VITALS — BP 140/69 | HR 82 | Temp 98.5°F | Resp 18 | Ht 73.0 in | Wt 240.0 lb

## 2023-02-01 DIAGNOSIS — Z01818 Encounter for other preprocedural examination: Secondary | ICD-10-CM | POA: Diagnosis not present

## 2023-02-01 DIAGNOSIS — E1159 Type 2 diabetes mellitus with other circulatory complications: Secondary | ICD-10-CM | POA: Diagnosis not present

## 2023-02-01 DIAGNOSIS — I152 Hypertension secondary to endocrine disorders: Secondary | ICD-10-CM | POA: Diagnosis not present

## 2023-02-01 DIAGNOSIS — E119 Type 2 diabetes mellitus without complications: Secondary | ICD-10-CM | POA: Diagnosis not present

## 2023-02-01 LAB — BASIC METABOLIC PANEL
Anion gap: 8 (ref 5–15)
BUN: 20 mg/dL (ref 6–20)
CO2: 25 mmol/L (ref 22–32)
Calcium: 9.5 mg/dL (ref 8.9–10.3)
Chloride: 102 mmol/L (ref 98–111)
Creatinine, Ser: 1.25 mg/dL — ABNORMAL HIGH (ref 0.61–1.24)
GFR, Estimated: >60 mL/min (ref >60)
Glucose, Bld: 102 mg/dL — ABNORMAL HIGH (ref 70–99)
Potassium: 3.5 mmol/L (ref 3.5–5.1)
Sodium: 135 mmol/L (ref 135–145)

## 2023-02-01 NOTE — Progress Notes (Signed)
[facility] [address]  [address]   Clinic Day:  02/02/2023  Referring physician: Gareth Morgan, MD  Patient Care Team: Claretta Fraise, MD as PCP - General (Family Medicine) Gareth Morgan, MD (Inactive) as Referring Physician (Hematology) Burke Keels, MD (Surgery) [name], MD as Consulting Physician (General Surgery) Derek Jack, MD as Medical Oncologist (Medical Oncology)   ASSESSMENT & PLAN:   Assessment:  1.  Splanchnic vein thrombosis: - Seen at the request of Dr. Joan Flores at [facility] - Diagnosed 06/13/2021 with 2 weeks of left-sided abdominal pain, thrombosis of SMV, several jejunal veins, splenic vein, main portal vein and central right and left intrahepatic portal veins.  VTE risk factors include preceding diverticulitis.  APLA triple negative.  He underwent approximately 55 cm small bowel resection on 06/14/2021.  Also underwent VIR mechanical thrombectomy and balloon maceration of thrombus in SMV and splenic vein, embolization of the access tract.  He was treated with Xarelto 20 mg daily, switch to Xarelto 10 mg daily in May 2023. - JAK2 V617F testing was negative in July 2022.  2.  Social/family history: - He is retired Engineer, structural.  Non-smoker. - No family history of DVT/PE. - Maternal grandmother had stomach cancer.  3.  Iron deficiency anemia: - He developed iron deficiency anemia in November 2022 and again in May 2023.  Dr. Joan Flores has planned to give parenteral iron therapy.  However due to the insurance noncoverage, he did not receive it.  He took iron pills for 2 months with improvement.  4.  Mild thrombocytopenia: - He has mild thrombocytopenia since number of 2022, unclear etiology. - CTAP (10/07/2021): Spleen size normal.   Plan:  1.  Iron deficiency anemia: - Will check CBC, ferritin and iron panel today.  He reports stopping iron tablet several months ago. - Will schedule a phone visit in a week.  2.  Venous  thrombosis: - As the etiology for the cause is not clear, I have recommended Xarelto 10 mg daily indefinitely.  He is agreeable. - On physical exam today a subcentimeter lymph node in the right axillary region.  Will monitor.  3.  Hypogonadism: - He is currently taking testosterone 100 mg every 7 days.  Last testosterone was 1417 on 09/05/2022.  Will send a level today.   Orders Placed This Encounter  Procedures   CBC with Differential    Standing Status:   Future    Standing Expiration Date:   02/02/2024   Ferritin    Standing Status:   Future    Standing Expiration Date:   02/02/2024   Iron and TIBC ([facility] ONLY)    Standing Status:   Future    Standing Expiration Date:   02/03/2024   D-dimer, quantitative    Standing Status:   Future    Standing Expiration Date:   02/02/2024   Testosterone    Standing Status:   Future    Standing Expiration Date:   02/02/2024   Ambulatory referral to Social Work    Referral Priority:   Routine    Referral Type:   Consultation    Referral Reason:   Specialty Services Required    Referred to Provider:   Derek Jack, MD    Number of Visits Requested:   1      Kaleen Odea as a scribe for Derek Jack, MD.,have documented all relevant documentation on the behalf of Derek Jack, MD,as directed by  Derek Jack, MD while in  the presence of Derek Jack, MD.   I, Derek Jack MD, have reviewed the above documentation for accuracy and completeness, and I agree with the above.   Doyce Loose   2/16/20249:08 AM  CHIEF COMPLAINT/PURPOSE OF CONSULT:   Diagnosis: anemia and BVT  Current Therapy: Xarelto 10 mg daily.  HISTORY OF PRESENT ILLNESS:   James [name] is a 57 y.o. male presenting to clinic today for evaluation of anemia and BVT at the request of Dr. Gareth Morgan.  06/13/2021 Venous thromboembolism was diagnosed after 2 mo of diarrhea and then 2 weeks of  additional abdominal pain with thrombosis of the SMV, several jejunal veins, splenic vein, main portal vein and central right and left intrahepatic portal veins. VTE risk factors at that time included high hemoglobin from testosterone therapy and diverticulitis.  Presentation was with left-sided abdominal pain.  He also reports having COVID 6 months prior to the blood clot.  Today, he states that he is doing well overall. His appetite level is at 100%. His energy level is at 60%.  He denies any bleeding problems while on Xarelto. He denies blood in stool and black stool. He denies any unintentional weight loss. He stopped taking iron pills several months ago. He never had an iron infusion.   He is currently recovering from Covid respiratory issues due to having Covid 2 wks ago.   He is currently taking testosterone cypionate 0.5 ml once a week.  He denies history of blood clots in the family. His maternal grandma had stomach caner.   He denies all other issues and pains not discussed.   He is currently a Engineer, mining.   PAST MEDICAL HISTORY:   Past Medical History: Past Medical History:  Diagnosis Date   Arthritis    Hx of exploratory laparotomy    enterectomy small intest. resection,celiotomy   Hyperlipidemia    Hypertension    Hypothyroidism    Pre-diabetes    Sleep apnea     Surgical History: Past Surgical History:  Procedure Laterality Date   BACK SURGERY     BIOPSY  11/22/2021   Procedure: BIOPSY;  Surgeon: Harvel Quale, MD;  [facility];  Service: Gastroenterology;;   COLONOSCOPY WITH PROPOFOL N/A 11/22/2021   Procedure: COLONOSCOPY WITH PROPOFOL;  Surgeon: Harvel Quale, MD;  [facility];  Service: Gastroenterology;  Laterality: N/A;  8:45   ESOPHAGOGASTRODUODENOSCOPY (EGD) WITH PROPOFOL N/A 11/22/2021   Procedure: ESOPHAGOGASTRODUODENOSCOPY (EGD) WITH PROPOFOL;  Surgeon: Harvel Quale, MD;  [facility];  Service: Gastroenterology;  Laterality: N/A;   INCISIONAL HERNIA REPAIR N/A 11/23/2021   Procedure: OPEN COMPONENT SEPARATION AND MESH ABDOMINAL WALL RECONSTRUCTION FOR INCISIONAL HERNIA REPAIR, LYSIS OF ADHESIONS AND TRANSABDOMINAL PLANE BLOCK;  Surgeon: [name], MD;  Location: [facility];  Service: General;  Laterality: N/A;   POLYPECTOMY  11/22/2021   Procedure: POLYPECTOMY;  Surgeon: Harvel Quale, MD;  [facility];  Service: Gastroenterology;;   TONSILLECTOMY      Social History: Social History   Socioeconomic History   Marital status: Married    Spouse name: Not on file   Number of children: Not on file   Years of education: Not on file   Highest education level: Not on file  Occupational History   Not on file  Tobacco Use   Smoking status: Never    Passive exposure: Never   Smokeless tobacco: Never  Vaping Use   Vaping Use: Never used  Substance and Sexual Activity   Alcohol use: No   Drug use: No   Sexual activity: Yes    Birth control/protection: None  Other Topics Concern   Not on file  Social History Narrative   He is currently a Engineer, mining.    Social Determinants of Health   Financial Resource Strain: Not on file  Food Insecurity: No Food Insecurity (02/02/2023)   Hunger Vital Sign    Worried About Running Out of Food in the Last Year: Never true    Ran Out of Food in the Last Year: Never true  Transportation Needs: No Transportation Needs (02/02/2023)   PRAPARE - Hydrologist (Medical): No    Lack of Transportation (Non-Medical): No  Physical Activity: Not on file  Stress: Not on file  Social Connections: Not on file  Intimate Partner Violence: Not At Risk (02/02/2023)   Humiliation, Afraid, Rape, and Kick questionnaire    Fear of Current or Ex-Partner: No    Emotionally Abused: No    Physically Abused: No    Sexually Abused: No    Family History: Family History  Problem Relation Age  of Onset   COPD Mother    Alzheimer's disease Father    Stomach cancer Maternal Grandmother     Current Medications:  Current Outpatient Medications:    acidophilus (RISAQUAD) CAPS capsule, Take 1 capsule by mouth daily., Disp: , Rfl:    aspirin EC 81 MG tablet, Take 81 mg by mouth daily. Swallow whole., Disp: , Rfl:    atorvastatin (LIPITOR) 40 MG tablet, TAKE ONE (1) TABLET EACH DAY, Disp: 90 tablet, Rfl: 2   blood glucose meter kit and supplies, Dispense based on patient and insurance preference. Use up to four times daily as directed. (FOR ICD-10 E10.9, E11.9)., Disp: 1 each, Rfl: 0   carvedilol (COREG) 3.125 MG tablet, Take 1 tablet (3.125 mg total) by mouth 2 (two) times daily with a meal., Disp: 60 tablet, Rfl: 5   fenofibrate 160 MG tablet, TAKE ONE (1) TABLET EACH DAY, Disp: 90 tablet, Rfl: 2   fluticasone (FLONASE) 50 MCG/ACT nasal spray, USE 1 SPRAY IN BOTH NOSTRILS TWICE DAILYAS NEEDED FOR ALLERGIES OR RHINITIS, Disp: 16 g, Rfl: 0   levothyroxine (SYNTHROID) 50 MCG tablet, TAKE ONE (1) TABLET EACH DAY, Disp: 90 tablet, Rfl: 2   sildenafil (REVATIO) 20 MG tablet, Take 2-5 pills at once, orally, with each sexual encounter, Disp: 50 tablet, Rfl: 5   testosterone cypionate (DEPOTESTOSTERONE CYPIONATE) 200 MG/ML injection, INJECT 0.5ML IM EVERY 7 DAYS, Disp: 10 mL, Rfl: 0   valsartan-hydrochlorothiazide (DIOVAN-HCT) 320-25 MG tablet, TAKE ONE TABLET BY MOUTH EACH DAY FOR BLOOD PRESSURE, Disp: 90 tablet, Rfl: 0   XARELTO 20 MG TABS tablet, Take 10 mg by mouth daily., Disp: , Rfl:    Allergies: Allergies  Allergen Reactions   Metformin And Related     Loose bloody stools    REVIEW OF SYSTEMS:   Review of Systems  Constitutional:  Negative for chills, fatigue and fever.  HENT:   Negative for lump/mass, mouth sores, nosebleeds, sore throat and trouble swallowing.   Eyes:  Negative for eye problems.  Respiratory:  Positive for cough (Covid 2 wks ago) and shortness of breath  (Covid 2 wks ago).   Cardiovascular:  Negative for chest pain, leg swelling and palpitations.  Gastrointestinal:  Positive for diarrhea (Chronic). Negative for abdominal pain, constipation, nausea and vomiting.  Genitourinary:  Negative for bladder incontinence,  difficulty urinating, dysuria, frequency, hematuria and nocturia.   Musculoskeletal:  Negative for arthralgias, back pain, flank pain, myalgias and neck pain.  Skin:  Negative for itching and rash.  Neurological:  Positive for headaches. Negative for dizziness and numbness.  Hematological:  Does not bruise/bleed easily.  Psychiatric/Behavioral:  Positive for sleep disturbance. Negative for depression and suicidal ideas. The patient is not nervous/anxious.   All other systems reviewed and are negative.    VITALS:   Blood pressure 120/72, pulse 83, temperature 97.9 F (36.6 C), temperature source Oral, resp. rate 16, height 6' 1"$  (1.854 m), weight 245 lb 12.8 oz (111.5 kg), SpO2 98 %.  Wt Readings from Last 3 Encounters:  02/02/23 245 lb 12.8 oz (111.5 kg)  02/01/23 240 lb (108.9 kg)  01/02/23 245 lb 6.4 oz (111.3 kg)    Body mass index is 32.43 kg/m.   PHYSICAL EXAM:   Physical Exam Vitals and nursing note reviewed. Exam conducted with a chaperone present.  Constitutional:      Appearance: Normal appearance.  Cardiovascular:     Rate and Rhythm: Normal rate and regular rhythm.     Pulses: Normal pulses.     Heart sounds: Normal heart sounds.  Pulmonary:     Effort: Pulmonary effort is normal.     Breath sounds: Normal breath sounds.  Abdominal:     Palpations: Abdomen is soft. There is no hepatomegaly, splenomegaly or mass.     Tenderness: There is no abdominal tenderness.  Musculoskeletal:     Right lower leg: No edema.     Left lower leg: No edema.  Lymphadenopathy:     Cervical: No cervical adenopathy.     Right cervical: No superficial, deep or posterior cervical adenopathy.    Left cervical: No superficial,  deep or posterior cervical adenopathy.     Upper Body:     Right upper body: Axillary adenopathy present. No supraclavicular adenopathy.     Left upper body: No supraclavicular or axillary adenopathy.     Comments: Right axillary 1 centimeter lymph node under   Neurological:     General: No focal deficit present.     Mental Status: He is alert and oriented to person, place, and time.  Psychiatric:        Mood and Affect: Mood normal.        Behavior: Behavior normal.     LABS:      Latest Ref Rng & Units 09/05/2022    8:38 AM 03/02/2022    2:05 PM 11/24/2021    4:38 AM  CBC  WBC 3.4 - 10.8 x10E3/uL 7.9  5.4  11.1   Hemoglobin 13.0 - 17.7 g/dL 15.3  11.6  8.6   Hematocrit 37.5 - 51.0 % 46.1  37.8  27.8   Platelets 150 - 450 x10E3/uL 120  132  128       Latest Ref Rng & Units 02/01/2023   10:04 AM 09/05/2022    8:38 AM 03/02/2022    2:05 PM  CMP  Glucose 70 - 99 mg/dL 102  111  117   BUN 6 - 20 mg/dL 20  18  20   $ Creatinine 0.61 - 1.24 mg/dL 1.25  1.46  1.67   Sodium 135 - 145 mmol/L 135  140  137   Potassium 3.5 - 5.1 mmol/L 3.5  3.9  4.2   Chloride 98 - 111 mmol/L 102  102  102   CO2 22 - 32 mmol/L 25  24  23   Calcium 8.9 - 10.3 mg/dL 9.5  9.7  9.3   Total Protein 6.0 - 8.5 g/dL  6.9  7.1   Total Bilirubin 0.0 - 1.2 mg/dL  0.8  0.7   Alkaline Phos 44 - 121 IU/L  59  57   AST 0 - 40 IU/L  28  25   ALT 0 - 44 IU/L  32  19      No results found for: "CEA1", "CEA" / No results found for: "CEA1", "CEA" No results found for: "PSA1" No results found for: "[id number]" No results found for: "CAN125"  No results found for: "TOTALPROTELP", "ALBUMINELP", "A1GS", "A2GS", "BETS", "BETA2SER", "GAMS", "MSPIKE", "SPEI" No results found for: "TIBC", "FERRITIN", "IRONPCTSAT" No results found for: "LDH"   STUDIES:   No results found.

## 2023-02-02 ENCOUNTER — Encounter: Payer: Self-pay | Admitting: Hematology

## 2023-02-02 ENCOUNTER — Inpatient Hospital Stay: Payer: 59

## 2023-02-02 ENCOUNTER — Inpatient Hospital Stay: Payer: 59 | Attending: Hematology | Admitting: Hematology

## 2023-02-02 VITALS — BP 120/72 | HR 83 | Temp 97.9°F | Resp 16 | Ht 73.0 in | Wt 245.8 lb

## 2023-02-02 DIAGNOSIS — D509 Iron deficiency anemia, unspecified: Secondary | ICD-10-CM

## 2023-02-02 DIAGNOSIS — Z86718 Personal history of other venous thrombosis and embolism: Secondary | ICD-10-CM

## 2023-02-02 DIAGNOSIS — Z7901 Long term (current) use of anticoagulants: Secondary | ICD-10-CM

## 2023-02-02 DIAGNOSIS — E291 Testicular hypofunction: Secondary | ICD-10-CM | POA: Diagnosis not present

## 2023-02-02 DIAGNOSIS — Z79890 Hormone replacement therapy (postmenopausal): Secondary | ICD-10-CM

## 2023-02-02 DIAGNOSIS — D696 Thrombocytopenia, unspecified: Secondary | ICD-10-CM | POA: Diagnosis not present

## 2023-02-02 DIAGNOSIS — K55069 Acute infarction of intestine, part and extent unspecified: Secondary | ICD-10-CM

## 2023-02-02 DIAGNOSIS — I81 Portal vein thrombosis: Secondary | ICD-10-CM

## 2023-02-02 LAB — CBC WITH DIFFERENTIAL/PLATELET
Abs Immature Granulocytes: 0.05 10*3/uL (ref 0.00–0.07)
Basophils Absolute: 0.1 10*3/uL (ref 0.0–0.1)
Basophils Relative: 1 %
Eosinophils Absolute: 0.1 10*3/uL (ref 0.0–0.5)
Eosinophils Relative: 2 %
HCT: 46.1 % (ref 39.0–52.0)
Hemoglobin: 14.8 g/dL (ref 13.0–17.0)
Immature Granulocytes: 1 %
Lymphocytes Relative: 14 %
Lymphs Abs: 1.1 10*3/uL (ref 0.7–4.0)
MCH: 29 pg (ref 26.0–34.0)
MCHC: 32.1 g/dL (ref 30.0–36.0)
MCV: 90.2 fL (ref 80.0–100.0)
Monocytes Absolute: 0.7 10*3/uL (ref 0.1–1.0)
Monocytes Relative: 10 %
Neutro Abs: 5.5 10*3/uL (ref 1.7–7.7)
Neutrophils Relative %: 72 %
Platelets: 129 10*3/uL — ABNORMAL LOW (ref 150–400)
RBC: 5.11 MIL/uL (ref 4.22–5.81)
RDW: 14.3 % (ref 11.5–15.5)
WBC: 7.5 10*3/uL (ref 4.0–10.5)
nRBC: 0 % (ref 0.0–0.2)

## 2023-02-02 LAB — IRON AND TIBC
Iron: 74 ug/dL (ref 45–182)
Saturation Ratios: 16 % — ABNORMAL LOW (ref 17.9–39.5)
TIBC: 466 ug/dL — ABNORMAL HIGH (ref 250–450)
UIBC: 392 ug/dL

## 2023-02-02 LAB — FERRITIN: Ferritin: 23 ng/mL — ABNORMAL LOW (ref 24–336)

## 2023-02-02 LAB — D-DIMER, QUANTITATIVE: D-Dimer, Quant: 0.32 ug/mL-FEU (ref 0.00–0.50)

## 2023-02-02 NOTE — Patient Instructions (Addendum)
[facility]  Discharge Instructions  You were seen and examined today by Dr. Delton Coombes. Dr. Delton Coombes is a hematologist, meaning that he specializes in blood abnormalities. Dr. Delton Coombes discussed your past medical history, family history of cancers/blood conditions and the events that led to you being here today.  You were referred to Dr. Delton Coombes for ongoing care of your previous anemia and blood clots.  Dr. Delton Coombes has recommended additional labs today for further evaluation, he will be rechecking your blood counts, iron, testosterone and a d-dimer (elevated with the presence of blood clots).  Continue Xarelto as prescribed.  Follow-up as scheduled.  Thank you for choosing [facility] to provide your oncology and hematology care.   To afford each patient quality time with our provider, please arrive at least 15 minutes before your scheduled appointment time. You may need to reschedule your appointment if you arrive late (10 or more minutes). Arriving late affects you and other patients whose appointments are after yours.  Also, if you miss three or more appointments without notifying the office, you may be dismissed from the clinic at the provider's discretion.    Again, thank you for choosing [facility].  Our hope is that these requests will decrease the amount of time that you wait before being seen by our physicians.   If you have a lab appointment with the [facility] please come in thru the Main Entrance and check in at the main information desk.           _____________________________________________________________  Should you have questions after your visit to [facility], please contact our office at [phone] and follow the prompts.  Our office hours are 8:00 a.m. to 4:30 p.m. Monday - Thursday and 8:00 a.m. to 2:30 p.m. Friday.  Please note that voicemails left after 4:00 p.m. may  not be returned until the following business day.  We are closed weekends and all major holidays.  You do have access to a nurse 24-7, just call the main number to the clinic [phone] and do not press any options, hold on the line and a nurse will answer the phone.    For prescription refill requests, have your pharmacy contact our office and allow 72 hours.    Masks are optional in the cancer centers. If you would like for your care team to wear a mask while they are taking care of you, please let them know. You may have one support person who is at least 56 years old accompany you for your appointments.

## 2023-02-03 LAB — TESTOSTERONE: Testosterone: 991 ng/dL — ABNORMAL HIGH (ref 264–916)

## 2023-02-05 ENCOUNTER — Telehealth: Payer: Self-pay | Admitting: Family Medicine

## 2023-02-05 ENCOUNTER — Other Ambulatory Visit: Payer: Self-pay | Admitting: Family Medicine

## 2023-02-05 DIAGNOSIS — H9203 Otalgia, bilateral: Secondary | ICD-10-CM

## 2023-02-05 NOTE — Telephone Encounter (Signed)
REFERRAL REQUEST Telephone Note  Have you been seen at our office for this problem? 01/23/2023 (Advise that they may need an appointment with their PCP before a referral can be done)  Reason for Referral: ENT Referral discussed with patient: yes pt was told if no better to call back and go head with the ENT referral  Best contact number of patient for referral team: [phone]    Has patient been seen by a specialist for this issue before: it has been awhile  Patient provider preference for referral: Dr Blenda Peals in [facility]? Or whoever [facility] recommends Patient location preference for referral: Dr Blenda Peals in [facility]? Or whoever Rakes recommends   Patient notified that referrals can take up to a week or longer to process. If they haven't heard anything within a week they should call back and speak with the referral department.

## 2023-02-05 NOTE — Telephone Encounter (Signed)
Referral placed, as requested WS 

## 2023-02-06 ENCOUNTER — Other Ambulatory Visit: Payer: Self-pay

## 2023-02-06 ENCOUNTER — Ambulatory Visit (HOSPITAL_COMMUNITY)
Admission: RE | Admit: 2023-02-06 | Discharge: 2023-02-06 | Disposition: A | Discharge status: Home / Self Care | Payer: 59 | Source: Ambulatory Visit | Attending: Gastroenterology | Admitting: Gastroenterology

## 2023-02-06 ENCOUNTER — Encounter (HOSPITAL_COMMUNITY): Payer: Self-pay | Admitting: Gastroenterology

## 2023-02-06 ENCOUNTER — Ambulatory Visit (HOSPITAL_BASED_OUTPATIENT_CLINIC_OR_DEPARTMENT_OTHER): Payer: 59 | Admitting: Anesthesiology

## 2023-02-06 ENCOUNTER — Encounter (HOSPITAL_COMMUNITY)
Admission: RE | Disposition: A | Discharge status: Home / Self Care | Payer: Self-pay | Source: Ambulatory Visit | Attending: Gastroenterology

## 2023-02-06 ENCOUNTER — Ambulatory Visit (HOSPITAL_COMMUNITY): Payer: 59 | Admitting: Anesthesiology

## 2023-02-06 DIAGNOSIS — G4733 Obstructive sleep apnea (adult) (pediatric): Secondary | ICD-10-CM | POA: Diagnosis not present

## 2023-02-06 DIAGNOSIS — I10 Essential (primary) hypertension: Secondary | ICD-10-CM | POA: Insufficient documentation

## 2023-02-06 DIAGNOSIS — E039 Hypothyroidism, unspecified: Secondary | ICD-10-CM | POA: Diagnosis not present

## 2023-02-06 DIAGNOSIS — K3189 Other diseases of stomach and duodenum: Secondary | ICD-10-CM

## 2023-02-06 DIAGNOSIS — Z7901 Long term (current) use of anticoagulants: Secondary | ICD-10-CM | POA: Diagnosis not present

## 2023-02-06 DIAGNOSIS — Z79899 Other long term (current) drug therapy: Secondary | ICD-10-CM | POA: Insufficient documentation

## 2023-02-06 DIAGNOSIS — K766 Portal hypertension: Secondary | ICD-10-CM | POA: Diagnosis not present

## 2023-02-06 DIAGNOSIS — K449 Diaphragmatic hernia without obstruction or gangrene: Secondary | ICD-10-CM | POA: Diagnosis not present

## 2023-02-06 DIAGNOSIS — Z86718 Personal history of other venous thrombosis and embolism: Secondary | ICD-10-CM | POA: Insufficient documentation

## 2023-02-06 DIAGNOSIS — E785 Hyperlipidemia, unspecified: Secondary | ICD-10-CM | POA: Insufficient documentation

## 2023-02-06 DIAGNOSIS — I8510 Secondary esophageal varices without bleeding: Secondary | ICD-10-CM | POA: Diagnosis not present

## 2023-02-06 DIAGNOSIS — I8501 Esophageal varices with bleeding: Secondary | ICD-10-CM

## 2023-02-06 DIAGNOSIS — E1122 Type 2 diabetes mellitus with diabetic chronic kidney disease: Secondary | ICD-10-CM | POA: Diagnosis not present

## 2023-02-06 DIAGNOSIS — Z794 Long term (current) use of insulin: Secondary | ICD-10-CM | POA: Diagnosis not present

## 2023-02-06 DIAGNOSIS — E119 Type 2 diabetes mellitus without complications: Secondary | ICD-10-CM | POA: Insufficient documentation

## 2023-02-06 DIAGNOSIS — I8500 Esophageal varices without bleeding: Secondary | ICD-10-CM | POA: Diagnosis not present

## 2023-02-06 DIAGNOSIS — D751 Secondary polycythemia: Secondary | ICD-10-CM | POA: Diagnosis not present

## 2023-02-06 HISTORY — PX: ESOPHAGOGASTRODUODENOSCOPY (EGD) WITH PROPOFOL: SHX5813

## 2023-02-06 LAB — GLUCOSE, CAPILLARY
Glucose-Capillary: 78 mg/dL (ref 70–99)
Glucose-Capillary: 94 mg/dL (ref 70–99)

## 2023-02-06 SURGERY — ESOPHAGOGASTRODUODENOSCOPY (EGD) WITH PROPOFOL
Anesthesia: General

## 2023-02-06 MED ORDER — LACTATED RINGERS IV SOLN
INTRAVENOUS | Status: DC
  Administered 2023-02-06: 1000 mL via INTRAVENOUS

## 2023-02-06 MED ORDER — LIDOCAINE HCL (CARDIAC) PF 100 MG/5ML IV SOSY
PREFILLED_SYRINGE | INTRAVENOUS | Status: DC | PRN
  Administered 2023-02-06: 50 mg via INTRAVENOUS

## 2023-02-06 MED ORDER — DEXTROSE 50 % IV SOLN
INTRAVENOUS | Status: AC
  Administered 2023-02-06: 25 mL
  Filled 2023-02-06: qty 50

## 2023-02-06 MED ORDER — PROPOFOL 10 MG/ML IV BOLUS
INTRAVENOUS | Status: DC | PRN
  Administered 2023-02-06: 100 mg via INTRAVENOUS
  Administered 2023-02-06: 50 mg via INTRAVENOUS

## 2023-02-06 NOTE — Telephone Encounter (Signed)
Left detailed message per dpr referral was sent

## 2023-02-06 NOTE — H&P (Signed)
James [name] is an 57 y.o. male.   Chief Complaint: Surveillance esophageal varices HPI: James [name] is a 57 y.o. 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 diff, coming for surveillance of esophageal varices.  The patient denies having any nausea, vomiting, fever, chills, hematochezia, melena, hematemesis, abdominal distention, abdominal pain, diarrhea, jaundice, pruritus or weight loss. Patient has been taking carvedilol 3.125 mg twice daily compliantly.  Past Medical History:  Diagnosis Date   Arthritis    Hx of exploratory laparotomy    enterectomy small intest. resection,celiotomy   Hyperlipidemia    Hypertension    Hypothyroidism    Pre-diabetes    Sleep apnea     Past Surgical History:  Procedure Laterality Date   BACK SURGERY     BIOPSY  11/22/2021   Procedure: BIOPSY;  Surgeon: Harvel Quale, MD;  [facility];  Service: Gastroenterology;;   COLONOSCOPY WITH PROPOFOL N/A 11/22/2021   Procedure: COLONOSCOPY WITH PROPOFOL;  Surgeon: Harvel Quale, MD;  [facility];  Service: Gastroenterology;  Laterality: N/A;  8:45   ESOPHAGOGASTRODUODENOSCOPY (EGD) WITH PROPOFOL N/A 11/22/2021   Procedure: ESOPHAGOGASTRODUODENOSCOPY (EGD) WITH PROPOFOL;  Surgeon: Harvel Quale, MD;  [facility];  Service: Gastroenterology;  Laterality: N/A;   INCISIONAL HERNIA REPAIR N/A 11/23/2021   Procedure: OPEN COMPONENT SEPARATION AND MESH ABDOMINAL WALL RECONSTRUCTION FOR INCISIONAL HERNIA REPAIR, LYSIS OF ADHESIONS AND TRANSABDOMINAL PLANE BLOCK;  Surgeon: [name], MD;  Location[facility];  Service: General;  Laterality: N/A;   POLYPECTOMY  11/22/2021   Procedure: POLYPECTOMY;  Surgeon:  Harvel Quale, MD;  [facility];  Service: Gastroenterology;;   TONSILLECTOMY      Family History  Problem Relation Age of Onset   COPD Mother    Alzheimer's disease Father    Stomach cancer Maternal Grandmother    Social History:  reports that he has never smoked. He has never been exposed to tobacco smoke. He has never used smokeless tobacco. He reports that he does not drink alcohol and does not use drugs.  Allergies:  Allergies  Allergen Reactions   Metformin And Related     Loose bloody stools    Medications Prior to Admission  Medication Sig Dispense Refill   acidophilus (RISAQUAD) CAPS capsule Take 1 capsule by mouth daily.     aspirin EC 81 MG tablet Take 81 mg by mouth daily. Swallow whole.     atorvastatin (LIPITOR) 40 MG tablet TAKE ONE (1) TABLET EACH DAY 90 tablet 2   carvedilol (COREG) 3.125 MG tablet Take 1 tablet (3.125 mg total) by mouth 2 (two) times daily with a meal. 60 tablet 5   fenofibrate 160 MG tablet TAKE ONE (1) TABLET EACH DAY 90 tablet 2   fluticasone (FLONASE) 50 MCG/ACT nasal spray USE 1 SPRAY IN BOTH NOSTRILS TWICE DAILYAS NEEDED FOR ALLERGIES OR RHINITIS 16 g 0   levothyroxine (SYNTHROID) 50 MCG tablet TAKE ONE (1) TABLET EACH DAY 90 tablet 2   testosterone cypionate (DEPOTESTOSTERONE CYPIONATE) 200 MG/ML injection INJECT 0.5ML IM EVERY 7 DAYS 10 mL 0   valsartan-hydrochlorothiazide (DIOVAN-HCT) 320-25 MG tablet TAKE ONE TABLET BY MOUTH EACH DAY FOR BLOOD PRESSURE 90 tablet 0   XARELTO 20 MG TABS tablet Take 10 mg by mouth daily.  blood glucose meter kit and supplies Dispense based on patient and insurance preference. Use up to four times daily as directed. (FOR ICD-10 E10.9, E11.9). 1 each 0   sildenafil (REVATIO) 20 MG tablet Take 2-5 pills at once, orally, with each sexual encounter 50 tablet 5    Results for orders placed or performed during the hospital encounter of 02/06/23 (from the past 48 hour(s))  Glucose,  capillary     Status: None   Collection Time: 02/06/23 12:57 PM  Result Value Ref Range   Glucose-Capillary 78 70 - 99 mg/dL    Comment: Glucose reference range applies only to samples taken after fasting for at least 8 hours.   No results found.  Review of Systems  All other systems reviewed and are negative.   Blood pressure (!) 148/73, pulse 85, temperature 98.4 F (36.9 C), temperature source Oral, resp. rate 18, weight 108.4 kg, SpO2 96 %. Physical Exam  GENERAL: The patient is AO x3, in no acute distress. HEENT: Head is normocephalic and atraumatic. EOMI are intact. Mouth is well hydrated and without lesions. NECK: Supple. No masses LUNGS: Clear to auscultation. No presence of rhonchi/wheezing/rales. Adequate chest expansion HEART: RRR, normal s1 and s2. ABDOMEN: Soft, nontender, no guarding, no peritoneal signs, and nondistended. BS +. No masses. EXTREMITIES: Without any cyanosis, clubbing, rash, lesions or edema. NEUROLOGIC: AOx3, no focal motor deficit. SKIN: no jaundice, no rashes  Assessment/Plan : James [name] is a 57 y.o. 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 diff, coming for surveillance of esophageal varices.  Will need to proceed with EGD.  Harvel Quale, MD 02/06/2023, 2:04 PM

## 2023-02-06 NOTE — Anesthesia Postprocedure Evaluation (Signed)
Anesthesia Post Note  Patient: James [name]  Procedure(s) Performed: ESOPHAGOGASTRODUODENOSCOPY (EGD) WITH PROPOFOL  Patient location during evaluation: Phase II Anesthesia Type: General Level of consciousness: awake and alert and oriented Pain management: pain level controlled Vital Signs Assessment: post-procedure vital signs reviewed and stable Respiratory status: spontaneous breathing, nonlabored ventilation and respiratory function stable Cardiovascular status: blood pressure returned to baseline and stable Postop Assessment: no apparent nausea or vomiting Anesthetic complications: no  No notable events documented.   Last Vitals:  Vitals:   02/06/23 1454 02/06/23 1458  BP: (!) 100/45   Pulse: 86   Resp: 15   Temp: 36.8 C   SpO2: 95% 94%    Last Pain:  Vitals:   02/06/23 1454  TempSrc: Oral  PainSc: 0-No pain                 [name]

## 2023-02-06 NOTE — Discharge Instructions (Signed)
You are being discharged to home.  Resume your previous diet.  Your physician has recommended a repeat upper endoscopy in two years for surveillance.  Continue Coreg 3.125 mg BID.

## 2023-02-06 NOTE — Transfer of Care (Signed)
Immediate Anesthesia Transfer of Care Note  Patient: James [name]  Procedure(s) Performed: ESOPHAGOGASTRODUODENOSCOPY (EGD) WITH PROPOFOL  Patient Location: Short Stay  Anesthesia Type:General  Level of Consciousness: drowsy  Airway & Oxygen Therapy: Patient Spontanous Breathing  Post-op Assessment: Report given to RN and Post -op Vital signs reviewed and stable  Post vital signs: Reviewed and stable  Last Vitals:  Vitals Value Taken Time  BP 100/45 02/06/23 1454  Temp 36.8 C 02/06/23 1454  Pulse 86 02/06/23 1454  Resp 15 02/06/23 1454  SpO2 95 % 02/06/23 1454    Last Pain:  Vitals:   02/06/23 1454  TempSrc: Oral  PainSc: 0-No pain      Patients Stated Pain Goal: 7 ([id number])  Complications: No notable events documented.

## 2023-02-06 NOTE — Op Note (Signed)
[facility] Patient Name: James [name] Procedure Date: 02/06/2023 2:39 PM MRN: [id number] Date of Birth: [birth year] Attending MD: Maylon Peppers , , [id number] CSN: [id number] Age: 57 Admit Type: Outpatient Procedure:                Upper GI endoscopy Indications:              Follow-up of esophageal varices with bleeding Providers:                Maylon Peppers, Caprice Kluver, Hanover Page,                            Suzan Garibaldi. Risa Grill, Technician, Aram Candela Referring MD:              Medicines:                Monitored Anesthesia Care Complications:            No immediate complications. Estimated Blood Loss:     Estimated blood loss: none. Procedure:                Pre-Anesthesia Assessment:                           - Prior to the procedure, a History and Physical                            was performed, and patient medications, allergies                            and sensitivities were reviewed. The patient's                            tolerance of previous anesthesia was reviewed.                           - The risks and benefits of the procedure and the                            sedation options and risks were discussed with the                            patient. All questions were answered and informed                            consent was obtained.                           - ASA Grade Assessment: III - A patient with severe                            systemic disease.                           After obtaining informed consent, the endoscope was                            passed  under direct vision. Throughout the                            procedure, the patient's blood pressure, pulse, and                            oxygen saturations were monitored continuously. The                            [id number] scope was introduced through the                            mouth, and advanced to the second part of duodenum.                            The  upper GI endoscopy was accomplished without                            difficulty. The patient tolerated the procedure                            well. Scope In: 2:44:07 PM Scope Out: 2:48:08 PM Total Procedure Duration: 0 hours 4 minutes 1 second  Findings:      Grade I varices were found in the distal esophagus.      A 1 cm hiatal hernia was present.      Portal hypertensive gastropathy was found in the entire examined stomach.      The examined duodenum was normal. Impression:               - Grade I esophageal varices.                           - 1 cm hiatal hernia.                           - Portal hypertensive gastropathy.                           - Normal examined duodenum.                           - No specimens collected. Moderate Sedation:      Per Anesthesia Care Recommendation:           - Discharge patient to home (ambulatory).                           - Resume previous diet.                           - Repeat upper endoscopy in 2 years for                            surveillance.                           - Continue Coreg 3.125 mg BID.  Procedure Code(s):        --- Professional ---                           [phone], Esophagogastroduodenoscopy, flexible,                            transoral; diagnostic, including collection of                            specimen(s) by brushing or washing, when performed                            (separate procedure) Diagnosis Code(s):        --- Professional ---                           K44.9, Diaphragmatic hernia without obstruction or                            gangrene                           K76.6, Portal hypertension                           K31.89, Other diseases of stomach and duodenum                           I85.01, Esophageal varices with bleeding CPT copyright 2022 American Medical Association. All rights reserved. The codes documented in this report are preliminary and upon coder review may  be revised to meet  current compliance requirements. Maylon Peppers, MD Maylon Peppers,  02/06/2023 2:54:32 PM This report has been signed electronically. Number of Addenda: 0

## 2023-02-06 NOTE — Anesthesia Preprocedure Evaluation (Signed)
Anesthesia Evaluation  Patient identified by MRN, date of birth, ID band Patient awake    Reviewed: Allergy & Precautions, H&P , NPO status , Patient's Chart, lab work & pertinent test results, reviewed documented beta blocker date and time   Airway Mallampati: III  TM Distance: >3 FB Neck ROM: Full    Dental  (+) Dental Advisory Given, Chipped   Pulmonary sleep apnea and Continuous Positive Airway Pressure Ventilation    Pulmonary exam normal breath sounds clear to auscultation       Cardiovascular Exercise Tolerance: Good hypertension, Pt. on medications and Pt. on home beta blockers + DVT (mesenteric and portal vein thrombosis)  Normal cardiovascular exam Rhythm:Regular Rate:Normal     Neuro/Psych negative neurological ROS  negative psych ROS   GI/Hepatic negative GI ROS,,,(+)   Esophageal Varices      Endo/Other  diabetes, Well Controlled, Type 2, Insulin DependentHypothyroidism    Renal/GU Renal InsufficiencyRenal disease  negative genitourinary   Musculoskeletal  (+) Arthritis , Osteoarthritis,    Abdominal   Peds negative pediatric ROS (+)  Hematology  (+) Blood dyscrasia (secondary polycythemia)   Anesthesia Other Findings   Reproductive/Obstetrics negative OB ROS                             Anesthesia Physical Anesthesia Plan  ASA: 3  Anesthesia Plan: General   Post-op Pain Management: Minimal or no pain anticipated   Induction:   PONV Risk Score and Plan: 0 and Propofol infusion  Airway Management Planned: Nasal Cannula and Natural Airway  Additional Equipment:   Intra-op Plan:   Post-operative Plan:   Informed Consent: I have reviewed the patients History and Physical, chart, labs and discussed the procedure including the risks, benefits and alternatives for the proposed anesthesia with the patient or authorized representative who has indicated his/her  understanding and acceptance.     Dental advisory given  Plan Discussed with: CRNA and Surgeon  Anesthesia Plan Comments:        Anesthesia Quick Evaluation

## 2023-02-08 ENCOUNTER — Inpatient Hospital Stay: Payer: 59 | Admitting: Licensed Clinical Social Worker

## 2023-02-08 DIAGNOSIS — D509 Iron deficiency anemia, unspecified: Secondary | ICD-10-CM

## 2023-02-08 NOTE — Progress Notes (Signed)
[place] CSW Progress Note  Clinical Education officer, museum  received a referral to contact pt regarding completing advance directives.    Pt contacted and booked for 2/29 to complete and notarize.      Henriette Combs, LCSW

## 2023-02-12 ENCOUNTER — Encounter (HOSPITAL_COMMUNITY): Payer: Self-pay | Admitting: Gastroenterology

## 2023-02-13 ENCOUNTER — Encounter: Payer: Self-pay | Admitting: Physician Assistant

## 2023-02-13 ENCOUNTER — Inpatient Hospital Stay (HOSPITAL_BASED_OUTPATIENT_CLINIC_OR_DEPARTMENT_OTHER): Payer: 59 | Admitting: Physician Assistant

## 2023-02-13 VITALS — Wt 239.0 lb

## 2023-02-13 DIAGNOSIS — D500 Iron deficiency anemia secondary to blood loss (chronic): Secondary | ICD-10-CM

## 2023-02-13 DIAGNOSIS — K55069 Acute infarction of intestine, part and extent unspecified: Secondary | ICD-10-CM

## 2023-02-13 DIAGNOSIS — D696 Thrombocytopenia, unspecified: Secondary | ICD-10-CM

## 2023-02-13 NOTE — Progress Notes (Signed)
VIRTUAL VISIT via [place]   I connected with James [name]  on 02/13/23 at 1:16 PM by telephone and verified that I am speaking with the correct person using two identifiers.  Location: Patient: Home Provider: [facility]   I discussed the limitations, risks, security and privacy concerns of performing an evaluation and management service by telephone and the availability of in person appointments. I also discussed with the patient that there may be a patient responsible charge related to this service. The patient expressed understanding and agreed to proceed.  REASON FOR VISIT: Splanchnic vein thrombosis and iron deficiency anemia  CURRENT THERAPY: Xarelto  INTERVAL HISTORY:  James [name] is contacted today for follow-up of his splanchnic vein thrombosis and iron deficiency anemia.  He was last seen by Dr. Delton Coombes (initial consultation) on 02/02/2023..  At today's visit, he reports feeling fairly well.  He has not had any changes in his baseline health status or symptoms since his initial visit with Dr. Delton Coombes 2 weeks ago.  He continues to have ongoing abdominal pain and alternating diarrhea and constipation, persistent since small bowel resection in 2022.  He denies any nausea, vomiting, or new onset abdominal symptoms.  He continues to take both Xarelto and testosterone.  He does have occasional black and tarry bowel movements, most recently noticed this last week.  He reports some mild fatigue, denies any ice pica.  REVIEW OF SYSTEMS:   Review of Systems  Constitutional:  Positive for malaise/fatigue. Negative for chills, diaphoresis, fever and weight loss.  Respiratory:  Negative for cough and shortness of breath.   Cardiovascular:  Negative for chest pain and palpitations.  Gastrointestinal:  Positive for abdominal pain, constipation and diarrhea. Negative for blood in stool, melena, nausea and vomiting.  Neurological:   Negative for dizziness and headaches.     PHYSICAL EXAM: (per limitations of virtual telephone visit)  The patient is alert and oriented x 3, exhibiting adequate mentation, good mood, and ability to speak in full sentences and execute sound judgement.  ASSESSMENT & PLAN:  1.  Splanchnic vein thrombosis - Seen at the request of Dr. Joan Flores (benign hematology) at [facility] by 2 months of diarrhea and 2 weeks of abdominal pain - Diagnosed 06/13/2021: Thrombosis of SMV, several jejunal veins, splenic vein, main portal vein, central right and left intrahepatic portal veins - VTE risk factors: Testosterone therapy x 10 years, with secondary erythrocytosis at the time of presentation (Hgb 17.3 on 05/27/2021) Preceding diverticulitis (unclear if he had thrombi as a result of diverticulitis versus thrombi ultimately causing diverticulitis - Small bowel resection, approximately 55 cm (06/14/2021) - IR intervention with portal vein thrombectomy and angioplasty on 06/15/2021 - Treated with >6 months of Xarelto 20 mg daily, switched to prophylactic dose (Xarelto 10 mg daily) in May 2023 - JAK2 V617F testing negative (July 2022).  Antiphospholipid antibody testing triple negative. - Patient expressed desire to come off of Xarelto, but this was advised against. [facility] hematology (Dr. Joan Flores) favored continued prophylactic anticoagulant, especially since patient is back on testosterone treatment now. - Per Dr. Delton Coombes, indefinite anticoagulation favored due to unprovoked extensive splanchnic vein thrombosis with etiology unclear. - D-dimer (02/02/2023): Normal at 0.32.  Creatinine 1.25 with GFR >60. - PLAN: Continue Xarelto 10 mg daily indefinitely  2.  Iron deficiency anemia - Developed iron deficiency anemia in November 2022, again in May 2023. - EGD (11/22/2021): Very subtle grade 1 varices found  in lower third of esophagus, small in size - Colonoscopy (11/22/2021): Polyps x 10 throughout colon removed -  EGD (02/06/2023): Grade 1 esophageal varices, 1 cm hiatal hernia, portal hypertensive gastropathy - His previous hematologist (Dr. Joan Flores of [facility]) recommended IV iron therapy, but this was not covered by insurance therefore patient declines. - He took iron pills for 2 months, with improvement - Previously took iron tablet and did well on this, but stopped this several months ago  - Most recent labs (02/02/2023): Hgb 14.8/MCV 90.2.  Ferritin 23, iron saturation 16% with elevated TIBC 466 - Intermittent black tarry bowel movements - PLAN: Recommend starting ferrous sulfate 325 mg daily.  Recheck CBC with iron panel in 3 months.  3.  Mild thrombocytopenia - Mild thrombocytopenia since November 2022, unclear etiology - [facility] screen from [facility] hematology was negative in July 2022 - No associated anemia or white cell count abnormalities to suggest primary bone marrow problem such as myelofibrosis - CTAP (10/07/2021): Spleen size normal - Prior testing was negative for hepatitis C and HIV. - Patient taking vitamin B12 1000 mcg daily, although B12 levels were not checked so no clear indication for this - Most recent CBC/D (02/02/2023): Platelets 129 - PLAN: We will check additional thrombocytopenia labs when patient returns for follow-up in 3 months, including immature platelet fraction, B12, MMA, copper, folate, SPEP, light chains, ANA, RF, hepatitis B (surface antibody, surface antigen, core antibody)  4.  Lymphadenopathy (right axillary) - Subcentimeter lymph node in the right axillary region noted by Dr. Delton Coombes on 02/02/2023 - PLAN: Office visit in 3 months to follow-up on lymphadenopathy.  Patient advised to call sooner if he notices any obvious enlargement or new lymphadenopathy.  5.  Hypogonadism - Testosterone supplementation was stopped after he was diagnosed with splanchnic vein thrombosis - He restarted in May 2023, currently taking testosterone 100 mg every 7 days - Most recent testosterone  level 991 (02/02/2023) - PLAN: Continue management as per Dr. Livia Snellen (PCP)  6.  Other history - He is retired Engineer, structural.  Non-smoker. - No family history of DVT/PE. - Maternal grandmother had stomach cancer.  PLAN SUMMARY: >> Labs in 3 months = CBC/D, immature platelet fraction, CMP, ferritin, iron/TIBC, B12, MMA, folate, copper, SPEP, light chains, ANA, rheumatoid factor, hepatitis B (surface antibody, surface antigen, core antibody) >> OFFICE visit in 3 months (1 week after labs)     I discussed the assessment and treatment plan with the patient. The patient was provided an opportunity to ask questions and all were answered. The patient agreed with the plan and demonstrated an understanding of the instructions.   The patient was advised to call back or seek an in-person evaluation if the symptoms worsen or if the condition fails to improve as anticipated.  I provided 22 minutes of non-face-to-face time during this encounter.  Harriett Rush, PA-C 02/13/23 4:38 PM

## 2023-02-14 ENCOUNTER — Other Ambulatory Visit: Payer: Self-pay

## 2023-02-14 DIAGNOSIS — D500 Iron deficiency anemia secondary to blood loss (chronic): Secondary | ICD-10-CM

## 2023-02-14 DIAGNOSIS — D509 Iron deficiency anemia, unspecified: Secondary | ICD-10-CM

## 2023-02-14 DIAGNOSIS — I81 Portal vein thrombosis: Secondary | ICD-10-CM

## 2023-02-14 DIAGNOSIS — D696 Thrombocytopenia, unspecified: Secondary | ICD-10-CM

## 2023-02-14 DIAGNOSIS — K55069 Acute infarction of intestine, part and extent unspecified: Secondary | ICD-10-CM

## 2023-02-15 ENCOUNTER — Inpatient Hospital Stay: Payer: 59 | Admitting: Licensed Clinical Social Worker

## 2023-02-22 ENCOUNTER — Ambulatory Visit (INDEPENDENT_AMBULATORY_CARE_PROVIDER_SITE_OTHER): Payer: BC Managed Care – PPO | Admitting: Gastroenterology

## 2023-03-05 ENCOUNTER — Encounter: Payer: Self-pay | Admitting: Family Medicine

## 2023-03-05 ENCOUNTER — Ambulatory Visit: Payer: 59 | Admitting: Family Medicine

## 2023-03-05 VITALS — BP 108/60 | HR 85 | Temp 98.1°F | Ht 73.0 in | Wt 244.6 lb

## 2023-03-05 DIAGNOSIS — E1159 Type 2 diabetes mellitus with other circulatory complications: Secondary | ICD-10-CM

## 2023-03-05 DIAGNOSIS — I152 Hypertension secondary to endocrine disorders: Secondary | ICD-10-CM

## 2023-03-05 DIAGNOSIS — E039 Hypothyroidism, unspecified: Secondary | ICD-10-CM

## 2023-03-05 DIAGNOSIS — E119 Type 2 diabetes mellitus without complications: Secondary | ICD-10-CM

## 2023-03-05 DIAGNOSIS — M2042 Other hammer toe(s) (acquired), left foot: Secondary | ICD-10-CM | POA: Diagnosis not present

## 2023-03-05 DIAGNOSIS — E1169 Type 2 diabetes mellitus with other specified complication: Secondary | ICD-10-CM

## 2023-03-05 DIAGNOSIS — H6992 Unspecified Eustachian tube disorder, left ear: Secondary | ICD-10-CM

## 2023-03-05 DIAGNOSIS — E291 Testicular hypofunction: Secondary | ICD-10-CM

## 2023-03-05 DIAGNOSIS — E785 Hyperlipidemia, unspecified: Secondary | ICD-10-CM

## 2023-03-05 LAB — BAYER DCA HB A1C WAIVED: HB A1C (BAYER DCA - WAIVED): 5.4 % (ref 4.8–5.6)

## 2023-03-05 NOTE — Progress Notes (Signed)
Subjective:  Patient ID: James [name], male    DOB: 14-Sep-1966  Age: 57 y.o. MRN: [id number]  CC: Medical Management of Chronic Issues   HPI Marinette presents for  follow-up of hypertension. Patient has no history of headache chest pain or shortness of breath or recent cough. Patient also denies symptoms of TIA such as focal numbness or weakness. Patient denies side effects from medication. States taking it regularly.    follow-up on  thyroid. The patient has a history of hypothyroidism for many years. It has been stable recently. Pt. denies any change in  voice, loss of hair, heat or cold intolerance. Energy level has been adequate but not great. Patient denies constipation and diarrhea. No myxedema. Medication is as noted below. Verified that pt is taking it daily on an empty stomach. Well tolerated.  Took testosterone, 1/2 ml IM yesterday.  presents forFollow-up of diabetes. Patient denies symptoms such as polyuria, polydipsia, excessive hunger, nausea No significant hypoglycemic spells noted. Medications reviewed. Pt reports taking them regularly without complication/adverse reaction being reported today.  Lab Results  Component Value Date   HGBA1C 5.4 03/05/2023   HGBA1C 5.4 09/04/2022   HGBA1C 5.1 03/02/2022        History Denney has a past medical history of Arthritis, exploratory laparotomy, Hyperlipidemia, Hypertension, Hypothyroidism, Pre-diabetes, and Sleep apnea.   He has a past surgical history that includes Back surgery; Tonsillectomy; Incisional hernia repair (N/A, 11/23/2021); Colonoscopy with propofol (N/A, 11/22/2021); Esophagogastroduodenoscopy (egd) with propofol (N/A, 11/22/2021); polypectomy (11/22/2021); biopsy (11/22/2021); and Esophagogastroduodenoscopy (egd) with propofol (N/A, 02/06/2023).   His family history includes Alzheimer's disease in his father; COPD in his mother; Stomach cancer in his maternal grandmother.He reports that he has never smoked.  He has never been exposed to tobacco smoke. He has never used smokeless tobacco. He reports that he does not drink alcohol and does not use drugs.  Current Outpatient Medications on File Prior to Visit  Medication Sig Dispense Refill   acidophilus (RISAQUAD) CAPS capsule Take 1 capsule by mouth daily.     aspirin EC 81 MG tablet Take 81 mg by mouth daily. Swallow whole.     atorvastatin (LIPITOR) 40 MG tablet TAKE ONE (1) TABLET EACH DAY 90 tablet 2   blood glucose meter kit and supplies Dispense based on patient and insurance preference. Use up to four times daily as directed. (FOR ICD-10 E10.9, E11.9). 1 each 0   carvedilol (COREG) 3.125 MG tablet Take 1 tablet (3.125 mg total) by mouth 2 (two) times daily with a meal. 60 tablet 5   fenofibrate 160 MG tablet TAKE ONE (1) TABLET EACH DAY 90 tablet 2   fluticasone (FLONASE) 50 MCG/ACT nasal spray USE 1 SPRAY IN BOTH NOSTRILS TWICE DAILYAS NEEDED FOR ALLERGIES OR RHINITIS 16 g 0   levothyroxine (SYNTHROID) 50 MCG tablet TAKE ONE (1) TABLET EACH DAY 90 tablet 2   sildenafil (REVATIO) 20 MG tablet Take 2-5 pills at once, orally, with each sexual encounter 50 tablet 5   testosterone cypionate (DEPOTESTOSTERONE CYPIONATE) 200 MG/ML injection INJECT 0.5ML IM EVERY 7 DAYS 10 mL 0   valsartan-hydrochlorothiazide (DIOVAN-HCT) 320-25 MG tablet TAKE ONE TABLET BY MOUTH EACH DAY FOR BLOOD PRESSURE 90 tablet 0   XARELTO 20 MG TABS tablet Take 10 mg by mouth daily.     No current facility-administered medications on file prior to visit.    ROS Review of Systems  Constitutional:  Negative for fever.  Respiratory:  Negative for  shortness of breath.   Cardiovascular:  Negative for chest pain.  Musculoskeletal:  Negative for arthralgias.  Skin:  Negative for rash.    Objective:  BP 108/60   Pulse 85   Temp 98.1 F (36.7 C)   Ht 6\' 1"  (1.854 m)   Wt 244 lb 9.6 oz (110.9 kg)   SpO2 96%   BMI 32.27 kg/m   BP Readings from Last 3 Encounters:   03/05/23 108/60  02/06/23 (!) 100/45  02/02/23 120/72    Wt Readings from Last 3 Encounters:  03/05/23 244 lb 9.6 oz (110.9 kg)  02/13/23 239 lb (108.4 kg)  02/06/23 239 lb (108.4 kg)     Physical Exam Vitals reviewed.  Constitutional:      Appearance: He is well-developed.  HENT:     Head: Normocephalic and atraumatic.     Right Ear: External ear normal.     Left Ear: External ear normal.     Mouth/Throat:     Pharynx: No oropharyngeal exudate or posterior oropharyngeal erythema.  Eyes:     Pupils: Pupils are equal, round, and reactive to light.  Cardiovascular:     Rate and Rhythm: Normal rate and regular rhythm.     Heart sounds: No murmur heard. Pulmonary:     Effort: No respiratory distress.     Breath sounds: Normal breath sounds.  Musculoskeletal:        General: Deformity (hammertoe left 2nd toe) present.     Cervical back: Normal range of motion and neck supple.  Neurological:     Mental Status: He is alert and oriented to person, place, and time.       Assessment & Plan:   Tilman was seen today for medical management of chronic issues.  Diagnoses and all orders for this visit:  Type 2 diabetes mellitus without complication, without long-term current use of insulin ([place]) -     Bayer DCA Hb A1c Waived -     Microalbumin / creatinine urine ratio  Hyperlipidemia associated with type 2 diabetes mellitus ([place]) -     Lipid panel  Hypertension associated with diabetes ([place]) -     CBC with Differential/Platelet -     CMP14+EGFR  Acquired hypothyroidism -     TSH + free T4  Hypogonadism in male  ETD (Eustachian tube dysfunction), left -     Ambulatory referral to ENT  Hammer toe of left foot -     Ambulatory referral to Podiatry   Allergies as of 03/05/2023       Reactions   Metformin And Related    Loose bloody stools        Medication List        Accurate as of March 05, 2023  9:35 PM. If you have any questions, ask your nurse or  doctor.          acidophilus Caps capsule Take 1 capsule by mouth daily.   aspirin EC 81 MG tablet Take 81 mg by mouth daily. Swallow whole.   atorvastatin 40 MG tablet Commonly known as: LIPITOR TAKE ONE (1) TABLET EACH DAY   blood glucose meter kit and supplies Dispense based on patient and insurance preference. Use up to four times daily as directed. (FOR ICD-10 E10.9, E11.9).   carvedilol 3.125 MG tablet Commonly known as: Coreg Take 1 tablet (3.125 mg total) by mouth 2 (two) times daily with a meal.   fenofibrate 160 MG tablet TAKE ONE (1) TABLET EACH DAY  fluticasone 50 MCG/ACT nasal spray Commonly known as: FLONASE USE 1 SPRAY IN BOTH NOSTRILS TWICE DAILYAS NEEDED FOR ALLERGIES OR RHINITIS   levothyroxine 50 MCG tablet Commonly known as: SYNTHROID TAKE ONE (1) TABLET EACH DAY   sildenafil 20 MG tablet Commonly known as: REVATIO Take 2-5 pills at once, orally, with each sexual encounter   testosterone cypionate 200 MG/ML injection Commonly known as: DEPOTESTOSTERONE CYPIONATE INJECT 0.5ML IM EVERY 7 DAYS   valsartan-hydrochlorothiazide 320-25 MG tablet Commonly known as: DIOVAN-HCT TAKE ONE TABLET BY MOUTH EACH DAY FOR BLOOD PRESSURE   Xarelto 20 MG Tabs tablet Generic drug: rivaroxaban Take 10 mg by mouth daily.         Follow-up: Return in about 6 months (around 09/05/2023), or if symptoms worsen or fail to improve.  Claretta Fraise, M.D.

## 2023-03-06 LAB — LIPID PANEL
Chol/HDL Ratio: 3.3 ratio (ref 0.0–5.0)
Cholesterol, Total: 62 mg/dL — ABNORMAL LOW (ref 100–199)
HDL: 19 mg/dL — ABNORMAL LOW (ref >39)
LDL Chol Calc (NIH): 11 mg/dL (ref 0–99)
Triglycerides: 202 mg/dL — ABNORMAL HIGH (ref 0–149)
VLDL Cholesterol Cal: 32 mg/dL (ref 5–40)

## 2023-03-06 LAB — CBC WITH DIFFERENTIAL/PLATELET
Basophils Absolute: 0.1 10*3/uL (ref 0.0–0.2)
Basos: 1 %
EOS (ABSOLUTE): 0.2 10*3/uL (ref 0.0–0.4)
Eos: 3 %
Hematocrit: 46.9 % (ref 37.5–51.0)
Hemoglobin: 15.4 g/dL (ref 13.0–17.7)
Immature Grans (Abs): 0 10*3/uL (ref 0.0–0.1)
Immature Granulocytes: 0 %
Lymphocytes Absolute: 1.1 10*3/uL (ref 0.7–3.1)
Lymphs: 15 %
MCH: 30.3 pg (ref 26.6–33.0)
MCHC: 32.8 g/dL (ref 31.5–35.7)
MCV: 92 fL (ref 79–97)
Monocytes Absolute: 0.5 10*3/uL (ref 0.1–0.9)
Monocytes: 8 %
Neutrophils Absolute: 5.2 10*3/uL (ref 1.4–7.0)
Neutrophils: 73 %
Platelets: 123 10*3/uL — ABNORMAL LOW (ref 150–450)
RBC: 5.09 x10E6/uL (ref 4.14–5.80)
RDW: 14.4 % (ref 11.6–15.4)
WBC: 7.1 10*3/uL (ref 3.4–10.8)

## 2023-03-06 LAB — CMP14+EGFR
ALT: 41 IU/L (ref 0–44)
AST: 35 IU/L (ref 0–40)
Albumin/Globulin Ratio: 1.8 (ref 1.2–2.2)
Albumin: 4.4 g/dL (ref 3.8–4.9)
Alkaline Phosphatase: 43 IU/L — ABNORMAL LOW (ref 44–121)
BUN/Creatinine Ratio: 11 (ref 9–20)
BUN: 16 mg/dL (ref 6–24)
Bilirubin Total: 1.1 mg/dL (ref 0.0–1.2)
CO2: 24 mmol/L (ref 20–29)
Calcium: 9.7 mg/dL (ref 8.7–10.2)
Chloride: 100 mmol/L (ref 96–106)
Creatinine, Ser: 1.41 mg/dL — ABNORMAL HIGH (ref 0.76–1.27)
Globulin, Total: 2.4 g/dL (ref 1.5–4.5)
Glucose: 167 mg/dL — ABNORMAL HIGH (ref 70–99)
Potassium: 4.1 mmol/L (ref 3.5–5.2)
Sodium: 138 mmol/L (ref 134–144)
Total Protein: 6.8 g/dL (ref 6.0–8.5)
eGFR: 59 mL/min/{1.73_m2} — ABNORMAL LOW (ref >59)

## 2023-03-06 LAB — TSH+FREE T4
Free T4: 1.14 ng/dL (ref 0.82–1.77)
TSH: 2.71 u[IU]/mL (ref 0.450–4.500)

## 2023-03-06 LAB — MICROALBUMIN / CREATININE URINE RATIO
Creatinine, Urine: 18.6 mg/dL
Microalb/Creat Ratio: <16 mg/g creat (ref 0–29)
Microalbumin, Urine: <3 ug/mL

## 2023-03-06 NOTE — Progress Notes (Signed)
Hello [name],  Your lab result is normal and/or stable.Some minor variations that are not significant are commonly marked abnormal, but do not represent any medical problem for you.  Best regards, Zacari Stiff, M.D.

## 2023-03-08 ENCOUNTER — Encounter (INDEPENDENT_AMBULATORY_CARE_PROVIDER_SITE_OTHER): Payer: Self-pay | Admitting: Gastroenterology

## 2023-03-08 ENCOUNTER — Ambulatory Visit (INDEPENDENT_AMBULATORY_CARE_PROVIDER_SITE_OTHER): Payer: 59 | Admitting: Gastroenterology

## 2023-03-08 VITALS — BP 118/72 | HR 73 | Temp 98.4°F | Ht 73.0 in | Wt 247.0 lb

## 2023-03-08 DIAGNOSIS — K580 Irritable bowel syndrome with diarrhea: Secondary | ICD-10-CM | POA: Diagnosis not present

## 2023-03-08 DIAGNOSIS — I8500 Esophageal varices without bleeding: Secondary | ICD-10-CM

## 2023-03-08 DIAGNOSIS — I8510 Secondary esophageal varices without bleeding: Secondary | ICD-10-CM | POA: Diagnosis not present

## 2023-03-08 NOTE — Progress Notes (Signed)
Referring Provider: Claretta Fraise, MD Primary Care Physician:  Claretta Fraise, MD Primary GI Physician: Jenetta Downer   Chief Complaint  Patient presents with   Irritable Bowel Syndrome    Follow up on IBS. Would like to discuss hiatial hernia.   HPI:   James [name] is a 57 y.o. 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 diff   Here today for follow up of IBS and esophageal varices  Last seen September 2023, at that time  noticing some pain to right side of abdomen since his abdominal surgery in December.Diarrhea comes and goes, he is using metamucil, usually with 1 BM per day. Diarrhea occurs a couple of times per week and will have up to 3-4 episodes of diarrhea with loose to watery stools. Appetite is improving.  He denies nausea or vomiting. He does note some dizziness with sudden movements or upon standing too fast. Denies melena. Has occasional toilet tissue hematochezia after BMs. He denies any weight loss, has actually gained weight.    He continues on Xarelto, is followed by hematology, last saw them 08/07/22 with plans to continue on anticoagulation at this time. Labs done at that time with normal iron studies.  Recommended to check BP/HTNx1 week, continue to folow with hematology, repeat EGD for EVs in dec 2023, low FODMAP diet.  EGD as below, continued on coreg 3.125mg  BID.  Present:  He is still having some abdominal  discomfort, certain foods tend to bring this on but he does not have pain everytime he eats. He has mix of constipation and diarrhea. Stools are watery when diarrhea occurs. He does not take anything for diarrhea. Will take stool softener for constipation, he is noting doing this almost daily. He is having to have a BM almost  everytime he eats. Denies rectal bleeding, has noted some darker stools, on iron pills. He was taking a daily probiotic, but notes he ran out and needs to get some more. He feels that his is helping some. He was not able to follow the LOW fodmap diet well but has eliminated certain foods that make symptoms worse. Has increased water intake, but feels he probably needs to drink more. He does metamucil some which also seems to help. He has had no weight loss. He  is concerned about the hiatal hernia though is not having any nausea, early satiety, dysphagia or issues with his appetite.   He is doing well on coreg 3.125mg  BID, denies any lightheadedness, dizziness or fatigue. He did not tolerate higher dosage of 6.25mg  BID as this caused some orthostatic hypotension.   CT A/P with contrast: Oct 2022 interval development of cavernous transformation of the main portal vein secondary to thrombosis noted on prior CT scan of June 13, 2021 -Embolization coils noted within right hepatic lobe which was not identified on prior exam. -Large amount of stool seen in the sigmoid colon and rectum concerning for rectal impaction. -Sigmoid diverticulosis is noted without definite evidence of acute inflammation. -Mild broad necked fat containing periumbilical hernia is  abdominal wall construction for incisional hernia with mesh placement, Dr. Johney Maine on 11/23/2021.   Last Colonoscopy:11/22/21 6 polyps in the cecum between 1 to 2 mm in size, 4 polyps in the sigmoid, ascending colon and cecum.  2 to 5 mm in size.  Diverticulosis.  Small internal  hemorrhoids.  Pathology was consistent with tubular adenomas except for sigmoid which was hyperplastic Last Endoscopy:01/2023  - Grade I esophageal varices.                           - 1 cm hiatal hernia.                           - Portal hypertensive gastropathy.                           - Normal examined duodenum.                           - No specimens collected.    Recommendations:  Repeat colonoscopy July 2024 Repeat EGD feb 2026   Past Medical History:  Diagnosis Date   Arthritis    Hx of exploratory laparotomy    enterectomy small intest. resection,celiotomy   Hyperlipidemia    Hypertension    Hypothyroidism    Pre-diabetes    Sleep apnea     Past Surgical History:  Procedure Laterality Date   BACK SURGERY     BIOPSY  11/22/2021   Procedure: BIOPSY;  Surgeon: Harvel Quale, MD;  [facility];  Service: Gastroenterology;;   COLONOSCOPY WITH PROPOFOL N/A 11/22/2021   Procedure: COLONOSCOPY WITH PROPOFOL;  Surgeon: Harvel Quale, MD;  [facility];  Service: Gastroenterology;  Laterality: N/A;  8:45   ESOPHAGOGASTRODUODENOSCOPY (EGD) WITH PROPOFOL N/A 11/22/2021   Procedure: ESOPHAGOGASTRODUODENOSCOPY (EGD) WITH PROPOFOL;  Surgeon: Harvel Quale, MD;  [facility];  Service: Gastroenterology;  Laterality: N/A;   ESOPHAGOGASTRODUODENOSCOPY (EGD) WITH PROPOFOL N/A 02/06/2023   Procedure: ESOPHAGOGASTRODUODENOSCOPY (EGD) WITH PROPOFOL;  Surgeon: Harvel Quale, MD;  [facility];  Service: Gastroenterology;  Laterality: N/A;  2:15 pm   INCISIONAL HERNIA REPAIR N/A 11/23/2021   Procedure: OPEN COMPONENT SEPARATION AND MESH ABDOMINAL WALL RECONSTRUCTION FOR INCISIONAL HERNIA REPAIR, LYSIS OF ADHESIONS AND TRANSABDOMINAL PLANE BLOCK;  Surgeon: [name], MD;  Location: [facility];  Service: General;  Laterality: N/A;   POLYPECTOMY  11/22/2021   Procedure: POLYPECTOMY;  Surgeon: Harvel Quale, MD;  [facility];  Service: Gastroenterology;;   TONSILLECTOMY      Current Outpatient Medications  Medication Sig Dispense Refill   acidophilus (RISAQUAD) CAPS capsule Take 1 capsule by mouth daily.     aspirin EC 81 MG tablet Take 81 mg by mouth daily. Swallow whole.     atorvastatin (LIPITOR) 40 MG tablet TAKE ONE (1) TABLET EACH DAY 90 tablet  2   blood glucose meter kit and supplies Dispense based on patient and insurance preference. Use up to four times daily as directed. (FOR ICD-10 E10.9, E11.9). 1 each 0   carvedilol (COREG) 3.125 MG tablet Take 1 tablet (3.125 mg total) by mouth 2 (two) times daily with a meal. 60 tablet 5   fenofibrate 160 MG tablet TAKE ONE (1) TABLET EACH DAY 90 tablet 2   fluticasone (FLONASE) 50 MCG/ACT nasal spray USE 1 SPRAY IN BOTH NOSTRILS TWICE DAILYAS NEEDED FOR ALLERGIES OR RHINITIS 16 g 0   levothyroxine (SYNTHROID) 50 MCG tablet TAKE ONE (1) TABLET EACH DAY 90 tablet 2   sildenafil (REVATIO) 20 MG tablet Take 2-5 pills at once, orally, with each sexual encounter 50 tablet  5   testosterone cypionate (DEPOTESTOSTERONE CYPIONATE) 200 MG/ML injection INJECT 0.5ML IM EVERY 7 DAYS 10 mL 0   valsartan-hydrochlorothiazide (DIOVAN-HCT) 320-25 MG tablet TAKE ONE TABLET BY MOUTH EACH DAY FOR BLOOD PRESSURE 90 tablet 0   XARELTO 20 MG TABS tablet Take 10 mg by mouth daily.     No current facility-administered medications for this visit.    Allergies as of 03/08/2023 - Review Complete 03/08/2023  Allergen Reaction Noted   Metformin and related  05/04/2021    Family History  Problem Relation Age of Onset   COPD Mother    Alzheimer's disease Father    Stomach cancer Maternal Grandmother     Social History   Socioeconomic History   Marital status: Married    Spouse name: Not on file   Number of children: Not on file   Years of education: Not on file   Highest education level: Not on file  Occupational History   Not on file  Tobacco Use   Smoking status: Never    Passive exposure: Never   Smokeless tobacco: Never  Vaping Use   Vaping Use: Never used  Substance and Sexual Activity   Alcohol use: No   Drug use: No   Sexual activity: Yes    Birth control/protection: None  Other Topics Concern   Not on file  Social History Narrative   He is currently a Engineer, mining.    Social  Determinants of Health   Financial Resource Strain: Not on file  Food Insecurity: No Food Insecurity (02/02/2023)   Hunger Vital Sign    Worried About Running Out of Food in the Last Year: Never true    Ran Out of Food in the Last Year: Never true  Transportation Needs: No Transportation Needs (02/02/2023)   PRAPARE - Hydrologist (Medical): No    Lack of Transportation (Non-Medical): No  Physical Activity: Not on file  Stress: Not on file  Social Connections: Not on file    Review of systems General: negative for malaise, night sweats, fever, chills, weight loss Neck: Negative for lumps, goiter, pain and significant neck swelling Resp: Negative for cough, wheezing, dyspnea at rest CV: Negative for chest pain, leg swelling, palpitations, orthopnea GI: denies melena, hematochezia, nausea, vomiting, dysphagia, odyonophagia, early satiety or unintentional weight loss. +constipation +diarrhea  MSK: Negative for joint pain or swelling, back pain, and muscle pain. Derm: Negative for itching or rash Psych: Denies depression, anxiety, memory loss, confusion. No homicidal or suicidal ideation.  Heme: Negative for prolonged bleeding, bruising easily, and swollen nodes. Endocrine: Negative for cold or heat intolerance, polyuria, polydipsia and goiter. Neuro: negative for tremor, gait imbalance, syncope and seizures. The remainder of the review of systems is noncontributory.  Physical Exam: There were no vitals taken for this visit. General:   Alert and oriented. No distress noted. Pleasant and cooperative.  Head:  Normocephalic and atraumatic. Eyes:  Conjuctiva clear without scleral icterus. Mouth:  Oral mucosa pink and moist. Good dentition. No lesions. Heart: Normal rate and rhythm, s1 and s2 heart sounds present.  Lungs: Clear lung sounds in all lobes. Respirations equal and unlabored. Abdomen:  +BS, soft, non-tender and non-distended. No rebound or guarding.  No HSM or masses noted. Derm: No palmar erythema or jaundice Msk:  Symmetrical without gross deformities. Normal posture. Extremities:  Without edema. Neurologic:  Alert and  oriented x4 Psych:  Alert and cooperative. Normal mood and affect.  Invalid input(s): "6 MONTHS"  ASSESSMENT: James [name] is a 57 y.o. male presenting today for follow up of IBS, esophageal varices   IBS-M: continues to have some abdominal discomfort with certain foods, intermittent diarrhea, constipation though feels this has improved some. Encouraged to continue with metamucil and aim for good water intake, as he was not able to follow the low FODMAP guide, he should continue to avoid trigger foods.   Esophageal varices: secondary to cavernomatous degeneration of portal vein. grade 1 EVs on recent EGD which will need to be repeated in 2 years. He is on coreg 3.125mg  BID, HR today was 73, not within goal of <60, however, he did not tolerate 6.25mg  BID dosing before and had orthostatic hypotension, therefore, will continue with current dose as he is tolerating well.   History of splenic and mesenteric thrombosis: follows with hematology. Remains on chronic anticoagulation. No changes in appetite or weight loss. Abdominal pain appears to be related to his IBS vs. Concern for ischemia.    PLAN:  Increase water intake, aim for atleast 64 oz per day Increase fruits, veggies and whole grains, kiwi and prunes are especially good for constipation 3. Continue coreg at 3.125mg  BID 4. Continue with metamucil  5. Avoid trigger foods  All questions were answered, patient verbalized understanding and is in agreement with plan as outlined above.    6 month follow up  [place] Alver Sorrow, MSN, APRN, AGNP-C Adult-Gerontology Nurse Practitioner [facility]  I have reviewed the note and agree with the APP's assessment as described in this progress note  Maylon Peppers, MD Gastroenterology and  Hepatology [facility] Gastroenterology

## 2023-03-08 NOTE — Patient Instructions (Addendum)
Continue to avoid trigger foods that cause worsening diarrhea You can continue to use metamucil, make sure water intake is good, aim for 64 oz per day Increase fruits, veggies and whole grains, kiwi and prunes are especially good for constipation Continue coreg 3.125mg  BID   Follow up 6 months

## 2023-03-08 NOTE — Progress Notes (Deleted)
He is still having some abdominal  discomfort, certain foods can make it worse. He has mix of constipation and diarrhea. Stools are watery when diarrhea occurs. He does take anything for diarrhea. Will take stool softener for constipation, he is noting doing this almost daily. He is having to have a BM almost everytime he eats. Denies rectal bleeding, has noted some darker stools, on iron pills. He was taking a daily probiotic, but notes he ran out and needs to get some more. He feels that his is helping some. He was not able to follow the LOW fodmap diet well but has eliminated certain foods that make symptoms worse. Has increased water intake, but feels he probably needs to drink more. He does metamucil. He  is concerned about the hiatal hernia though is not having any nausea, early satiety or issues with his appetite.

## 2023-03-12 ENCOUNTER — Ambulatory Visit: Payer: 59 | Admitting: Podiatry

## 2023-03-12 ENCOUNTER — Ambulatory Visit (INDEPENDENT_AMBULATORY_CARE_PROVIDER_SITE_OTHER): Payer: 59

## 2023-03-12 DIAGNOSIS — M2042 Other hammer toe(s) (acquired), left foot: Secondary | ICD-10-CM

## 2023-03-12 NOTE — Patient Instructions (Signed)
Pre-Operative Instructions  Congratulations, you have decided to take an important step to improving your quality of life.  You can be assured that the doctors of [facility] will be with you every step of the way.  Plan to be at the surgery center/hospital at least 1 (one) hour prior to your scheduled time unless otherwise directed by the surgical center/hospital staff.  You must have a responsible adult accompany you, remain during the surgery and drive you home.  Make sure you have directions to the surgical center/hospital and know how to get there on time. For hospital based surgery you will need to obtain a history and physical form from your family physician within 1 month prior to the date of surgery- we will give you a form for you primary physician.  We make every effort to accommodate the date you request for surgery.  There are however, times where surgery dates or times have to be moved.  We will contact you as soon as possible if a change in schedule is required.   No Aspirin/Ibuprofen for one week before surgery.  If you are on aspirin, any non-steroidal anti-inflammatory medications (Mobic, Aleve, Ibuprofen) you should stop taking it 7 days prior to your surgery.  You make take Tylenol  For pain prior to surgery.  Medications- If you are taking daily heart and blood pressure medications, seizure, reflux, allergy, asthma, anxiety, pain or diabetes medications, make sure the surgery center/hospital is aware before the day of surgery so they may notify you which medications to take or avoid the day of surgery. No food or drink after midnight the night before surgery unless directed otherwise by surgical center/hospital staff. No alcoholic beverages 24 hours prior to surgery.  No smoking 24 hours prior to or 24 hours after surgery. Wear loose pants or shorts- loose enough to fit over bandages, boots, and casts. No slip on shoes, sneakers are best. Bring your boot with you to the  surgery center/hospital.  Also bring crutches or a walker if your physician has prescribed it for you.  If you do not have this equipment, it will be provided for you after surgery. If you have not been contracted by the surgery center/hospital by the day before your surgery, call to confirm the date and time of your surgery. Leave-time from work may vary depending on the type of surgery you have.  Appropriate arrangements should be made prior to surgery with your employer. Prescriptions will be provided immediately following surgery by your doctor.  Have these filled as soon as possible after surgery and take the medication as directed. Remove nail polish on the operative foot. Wash the night before surgery.  The night before surgery wash the foot and leg well with the antibacterial soap provided and water paying special attention to beneath the toenails and in between the toes.  Rinse thoroughly with water and dry well with a towel.  Perform this wash unless told not to do so by your physician.  Enclosed: 1 Ice pack (please put in freezer the night before surgery)   1 Hibiclens skin cleaner   Pre-op Instructions  If you have any questions regarding the instructions, do not hesitate to call our office at any point during this process.   [place]: [address] [place], [place] [address] [phone]  [place]: [address] , [place] [address] [phone]  Dr. Nadean Montanaro, DPM  

## 2023-03-12 NOTE — Progress Notes (Unsigned)
Subjective:   Patient ID: James [name], male   DOB: 57 y.o.   MRN: [id number]   HPI Chief Complaint  Patient presents with   Foot Problem    Hammer toe of left foot  57 year old male presents the office for above concerns.  He states he has a hammertoe of the left second toe which is causing pain and he wants to have this fixed.  States it is rubbing causing irritation.  Tried shoe modifications and offloading with significant improvement he states it has being "rubbed raw".   A1c 5.4 on 03/05/2023 No tobacco No Etoh use On Xarelto    Review of Systems  All other systems reviewed and are negative.  Past Medical History:  Diagnosis Date   Arthritis    Hx of exploratory laparotomy    enterectomy small intest. resection,celiotomy   Hyperlipidemia    Hypertension    Hypothyroidism    Pre-diabetes    Sleep apnea     Past Surgical History:  Procedure Laterality Date   BACK SURGERY     BIOPSY  11/22/2021   Procedure: BIOPSY;  Surgeon: Harvel Quale, MD;  [facility];  Service: Gastroenterology;;   COLONOSCOPY WITH PROPOFOL N/A 11/22/2021   Procedure: COLONOSCOPY WITH PROPOFOL;  Surgeon: Harvel Quale, MD;  [facility];  Service: Gastroenterology;  Laterality: N/A;  8:45   ESOPHAGOGASTRODUODENOSCOPY (EGD) WITH PROPOFOL N/A 11/22/2021   Procedure: ESOPHAGOGASTRODUODENOSCOPY (EGD) WITH PROPOFOL;  Surgeon: Harvel Quale, MD;  [facility];  Service: Gastroenterology;  Laterality: N/A;   ESOPHAGOGASTRODUODENOSCOPY (EGD) WITH PROPOFOL N/A 02/06/2023   Procedure: ESOPHAGOGASTRODUODENOSCOPY (EGD) WITH PROPOFOL;  Surgeon: Harvel Quale, MD;  [facility];  Service: Gastroenterology;  Laterality: N/A;  2:15 pm   INCISIONAL HERNIA REPAIR N/A 11/23/2021   Procedure: OPEN COMPONENT SEPARATION AND MESH ABDOMINAL WALL RECONSTRUCTION FOR INCISIONAL HERNIA REPAIR, LYSIS OF ADHESIONS AND TRANSABDOMINAL PLANE  BLOCK;  Surgeon: [name], MD;  Location: [facility];  Service: General;  Laterality: N/A;   POLYPECTOMY  11/22/2021   Procedure: POLYPECTOMY;  Surgeon: Harvel Quale, MD;  [facility];  Service: Gastroenterology;;   TONSILLECTOMY       Current Outpatient Medications:    acidophilus (RISAQUAD) CAPS capsule, Take 1 capsule by mouth daily., Disp: , Rfl:    aspirin EC 81 MG tablet, Take 81 mg by mouth daily. Swallow whole., Disp: , Rfl:    atorvastatin (LIPITOR) 40 MG tablet, TAKE ONE (1) TABLET EACH DAY, Disp: 90 tablet, Rfl: 2   blood glucose meter kit and supplies, Dispense based on patient and insurance preference. Use up to four times daily as directed. (FOR ICD-10 E10.9, E11.9)., Disp: 1 each, Rfl: 0   carvedilol (COREG) 3.125 MG tablet, Take 1 tablet (3.125 mg total) by mouth 2 (two) times daily with a meal., Disp: 60 tablet, Rfl: 5   fenofibrate 160 MG tablet, TAKE ONE (1) TABLET EACH DAY, Disp: 90 tablet, Rfl: 2   fluticasone (FLONASE) 50 MCG/ACT nasal spray, USE 1 SPRAY IN BOTH NOSTRILS TWICE DAILYAS NEEDED FOR ALLERGIES OR RHINITIS, Disp: 16 g, Rfl: 0   levothyroxine (SYNTHROID) 50 MCG tablet, TAKE ONE (1) TABLET EACH DAY, Disp: 90 tablet, Rfl: 2   sildenafil (REVATIO) 20 MG tablet, Take 2-5 pills at once, orally, with each sexual encounter, Disp: 50 tablet, Rfl: 5   testosterone cypionate (DEPOTESTOSTERONE CYPIONATE) 200 MG/ML injection, INJECT 0.5ML IM EVERY 7 DAYS, Disp: 10 mL, Rfl: 0  valsartan-hydrochlorothiazide (DIOVAN-HCT) 320-25 MG tablet, TAKE ONE TABLET BY MOUTH EACH DAY FOR BLOOD PRESSURE, Disp: 90 tablet, Rfl: 0   XARELTO 20 MG TABS tablet, Take 10 mg by mouth daily., Disp: , Rfl:   Allergies  Allergen Reactions   Metformin And Related     Loose bloody stools          Objective:  Physical Exam  General: AAO x3, NAD  Dermatological: Mild erythema dorsal left second PIPJ without any skin breakdown or warmth.  There is no fluctuance or  crepitation.  There is no malodor.  Vascular: Dorsalis Pedis artery and Posterior Tibial artery pedal pulses are 2/4 bilateral with immedate capillary fill time. There is no pain with calf compression, swelling, warmth, erythema.   Neruologic: Grossly intact via light touch bilateral.   Musculoskeletal: Rigid hammertoe contracture noted left second toe with tenderness palpation within the PIPJ.  Severe arthritic changes present the first MPJ.  Mild hallux abductus.  No other area discomfort.  Gait: Unassisted, Nonantalgic.       Assessment:   57 year old male with symptomatic hammertoe left second toe     Plan:  -Treatment options discussed including all alternatives, risks, and complications -Etiology of symptoms were discussed -X-rays were obtained and reviewed with the patient.  3 views of the foot were obtained.  Significant arthritic changes present of the first MPJ. -We discussed both conservative as well as surgical treatment options for the hammertoe.  He has tried padding, shoe modifications and is not getting any better and was proceed with surgical invention.  I discussed with him second digit PIPJ arthrodesis.  We discussed the risk of her K wire fixation.  After discussion he wants to proceed with this. -The incision placement as well as the postoperative course was discussed with the patient. I discussed risks of the surgery which include, but not limited to, infection, bleeding, pain, swelling, need for further surgery, delayed or nonhealing, painful or ugly scar, numbness or sensation changes, over/under correction, recurrence, transfer lesions, further deformity, hardware failure, DVT/PE, loss of toe/foot. Patient understands these risks and wishes to proceed with surgery. The surgical consent was reviewed with the patient all 3 pages were signed. No promises or guarantees were given to the outcome of the procedure. All questions were answered to the best of my ability. Before  the surgery the patient was encouraged to call the office if there is any further questions. The surgery will be performed at the [facility] on an outpatient basis. -He will need medical clearance prior to surgery.  Trula Slade DPM

## 2023-03-13 ENCOUNTER — Other Ambulatory Visit: Payer: Self-pay | Admitting: Family Medicine

## 2023-03-15 ENCOUNTER — Encounter: Payer: Self-pay | Admitting: Podiatry

## 2023-03-19 ENCOUNTER — Telehealth: Payer: Self-pay | Admitting: Urology

## 2023-03-19 NOTE — Telephone Encounter (Signed)
DOS - 03/28/23   HAMMERTOE REPAIR 2ND LEFT --- [id number]   [employer]    SPOKE WITH MEGAN WITH [employer] AND SHE STATED THAT FOR CPT CODE 32440 NO PRIOR AUTH IS REQUIRED.  REF # [id number]

## 2023-03-21 ENCOUNTER — Telehealth: Payer: Self-pay

## 2023-03-21 NOTE — Telephone Encounter (Signed)
James [name] called to cancel his surgery with Dr. Jacqualyn Posey on 03/28/2023. He stated he has a high deductible and can't afford to have surgery at this time. Notified Dr. Jacqualyn Posey and Caren Griffins at [facility]

## 2023-04-02 ENCOUNTER — Encounter: Payer: 59 | Admitting: Podiatry

## 2023-04-13 ENCOUNTER — Encounter: Payer: 59 | Admitting: Podiatry

## 2023-04-24 DIAGNOSIS — K589 Irritable bowel syndrome without diarrhea: Secondary | ICD-10-CM | POA: Diagnosis not present

## 2023-04-24 DIAGNOSIS — Z86718 Personal history of other venous thrombosis and embolism: Secondary | ICD-10-CM | POA: Diagnosis not present

## 2023-04-24 DIAGNOSIS — Z7901 Long term (current) use of anticoagulants: Secondary | ICD-10-CM | POA: Diagnosis not present

## 2023-04-24 DIAGNOSIS — Z79890 Hormone replacement therapy (postmenopausal): Secondary | ICD-10-CM | POA: Diagnosis not present

## 2023-04-24 DIAGNOSIS — E039 Hypothyroidism, unspecified: Secondary | ICD-10-CM | POA: Diagnosis not present

## 2023-04-24 DIAGNOSIS — E669 Obesity, unspecified: Secondary | ICD-10-CM | POA: Diagnosis not present

## 2023-04-24 DIAGNOSIS — I10 Essential (primary) hypertension: Secondary | ICD-10-CM | POA: Diagnosis not present

## 2023-04-24 DIAGNOSIS — I4891 Unspecified atrial fibrillation: Secondary | ICD-10-CM | POA: Diagnosis not present

## 2023-04-24 DIAGNOSIS — N529 Male erectile dysfunction, unspecified: Secondary | ICD-10-CM | POA: Diagnosis not present

## 2023-04-24 DIAGNOSIS — G4733 Obstructive sleep apnea (adult) (pediatric): Secondary | ICD-10-CM | POA: Diagnosis not present

## 2023-04-24 DIAGNOSIS — E291 Testicular hypofunction: Secondary | ICD-10-CM | POA: Diagnosis not present

## 2023-04-24 DIAGNOSIS — E785 Hyperlipidemia, unspecified: Secondary | ICD-10-CM | POA: Diagnosis not present

## 2023-04-26 ENCOUNTER — Encounter: Payer: 59 | Admitting: Podiatry

## 2023-05-04 ENCOUNTER — Other Ambulatory Visit: Payer: Self-pay | Admitting: Family Medicine

## 2023-05-04 DIAGNOSIS — E1169 Type 2 diabetes mellitus with other specified complication: Secondary | ICD-10-CM

## 2023-05-16 ENCOUNTER — Inpatient Hospital Stay: Payer: 59 | Attending: Hematology

## 2023-05-16 ENCOUNTER — Inpatient Hospital Stay: Payer: 59

## 2023-05-21 ENCOUNTER — Inpatient Hospital Stay: Payer: 59 | Attending: Hematology

## 2023-05-21 DIAGNOSIS — I82890 Acute embolism and thrombosis of other specified veins: Secondary | ICD-10-CM | POA: Diagnosis not present

## 2023-05-21 DIAGNOSIS — E291 Testicular hypofunction: Secondary | ICD-10-CM | POA: Insufficient documentation

## 2023-05-21 DIAGNOSIS — D509 Iron deficiency anemia, unspecified: Secondary | ICD-10-CM | POA: Diagnosis not present

## 2023-05-21 DIAGNOSIS — D500 Iron deficiency anemia secondary to blood loss (chronic): Secondary | ICD-10-CM

## 2023-05-21 DIAGNOSIS — D696 Thrombocytopenia, unspecified: Secondary | ICD-10-CM | POA: Insufficient documentation

## 2023-05-21 DIAGNOSIS — Z7901 Long term (current) use of anticoagulants: Secondary | ICD-10-CM | POA: Insufficient documentation

## 2023-05-21 DIAGNOSIS — Z79890 Hormone replacement therapy (postmenopausal): Secondary | ICD-10-CM | POA: Diagnosis not present

## 2023-05-21 DIAGNOSIS — K55069 Acute infarction of intestine, part and extent unspecified: Secondary | ICD-10-CM

## 2023-05-21 DIAGNOSIS — N182 Chronic kidney disease, stage 2 (mild): Secondary | ICD-10-CM | POA: Insufficient documentation

## 2023-05-21 DIAGNOSIS — I81 Portal vein thrombosis: Secondary | ICD-10-CM | POA: Diagnosis not present

## 2023-05-21 LAB — IRON AND TIBC
Iron: 104 ug/dL (ref 45–182)
Saturation Ratios: 27 % (ref 17.9–39.5)
TIBC: 387 ug/dL (ref 250–450)
UIBC: 283 ug/dL

## 2023-05-21 LAB — HEPATITIS B SURFACE ANTIBODY,QUALITATIVE: Hep B S Ab: NONREACTIVE

## 2023-05-21 LAB — CBC WITH DIFFERENTIAL/PLATELET
Abs Immature Granulocytes: 0.02 10*3/uL (ref 0.00–0.07)
Basophils Absolute: 0.1 10*3/uL (ref 0.0–0.1)
Basophils Relative: 1 %
Eosinophils Absolute: 0.3 10*3/uL (ref 0.0–0.5)
Eosinophils Relative: 4 %
HCT: 47.7 % (ref 39.0–52.0)
Hemoglobin: 16.3 g/dL (ref 13.0–17.0)
Immature Granulocytes: 0 %
Lymphocytes Relative: 14 %
Lymphs Abs: 1.2 10*3/uL (ref 0.7–4.0)
MCH: 31.3 pg (ref 26.0–34.0)
MCHC: 34.2 g/dL (ref 30.0–36.0)
MCV: 91.7 fL (ref 80.0–100.0)
Monocytes Absolute: 0.7 10*3/uL (ref 0.1–1.0)
Monocytes Relative: 9 %
Neutro Abs: 5.9 10*3/uL (ref 1.7–7.7)
Neutrophils Relative %: 72 %
Platelets: 131 10*3/uL — ABNORMAL LOW (ref 150–400)
RBC: 5.2 MIL/uL (ref 4.22–5.81)
RDW: 14.3 % (ref 11.5–15.5)
WBC: 8.3 10*3/uL (ref 4.0–10.5)
nRBC: 0 % (ref 0.0–0.2)

## 2023-05-21 LAB — COMPREHENSIVE METABOLIC PANEL
ALT: 39 U/L (ref 0–44)
AST: 29 U/L (ref 15–41)
Albumin: 4 g/dL (ref 3.5–5.0)
Alkaline Phosphatase: 40 U/L (ref 38–126)
Anion gap: 10 (ref 5–15)
BUN: 16 mg/dL (ref 6–20)
CO2: 25 mmol/L (ref 22–32)
Calcium: 9.3 mg/dL (ref 8.9–10.3)
Chloride: 99 mmol/L (ref 98–111)
Creatinine, Ser: 1.4 mg/dL — ABNORMAL HIGH (ref 0.61–1.24)
GFR, Estimated: 59 mL/min — ABNORMAL LOW (ref >60)
Glucose, Bld: 105 mg/dL — ABNORMAL HIGH (ref 70–99)
Potassium: 3.3 mmol/L — ABNORMAL LOW (ref 3.5–5.1)
Sodium: 134 mmol/L — ABNORMAL LOW (ref 135–145)
Total Bilirubin: 1.8 mg/dL — ABNORMAL HIGH (ref 0.3–1.2)
Total Protein: 7.1 g/dL (ref 6.5–8.1)

## 2023-05-21 LAB — FOLATE: Folate: 7.9 ng/mL (ref >5.9)

## 2023-05-21 LAB — VITAMIN B12: Vitamin B-12: 866 pg/mL (ref 180–914)

## 2023-05-21 LAB — HEPATITIS B SURFACE ANTIGEN: Hepatitis B Surface Ag: NONREACTIVE

## 2023-05-21 LAB — HEPATITIS B CORE ANTIBODY, TOTAL: Hep B Core Total Ab: NONREACTIVE

## 2023-05-21 LAB — IMMATURE PLATELET FRACTION: Immature Platelet Fraction: 7.2 % (ref 1.2–8.6)

## 2023-05-21 LAB — FERRITIN: Ferritin: 44 ng/mL (ref 24–336)

## 2023-05-22 LAB — COPPER, SERUM: Copper: 54 ug/dL — ABNORMAL LOW (ref 69–132)

## 2023-05-22 LAB — KAPPA/LAMBDA LIGHT CHAINS
Kappa free light chain: 21.7 mg/L — ABNORMAL HIGH (ref 3.3–19.4)
Kappa, lambda light chain ratio: 1.12 (ref 0.26–1.65)
Lambda free light chains: 19.4 mg/L (ref 5.7–26.3)

## 2023-05-22 LAB — RHEUMATOID FACTOR: Rheumatoid fact SerPl-aCnc: <10 IU/mL (ref <14.0)

## 2023-05-23 ENCOUNTER — Ambulatory Visit: Payer: 59 | Admitting: Physician Assistant

## 2023-05-23 ENCOUNTER — Inpatient Hospital Stay: Payer: 59 | Admitting: Physician Assistant

## 2023-05-23 LAB — METHYLMALONIC ACID, SERUM: Methylmalonic Acid, Quantitative: 161 nmol/L (ref 0–378)

## 2023-05-24 LAB — PROTEIN ELECTROPHORESIS, SERUM
A/G Ratio: 1.4 (ref 0.7–1.7)
Albumin ELP: 3.9 g/dL (ref 2.9–4.4)
Alpha-1-Globulin: 0.2 g/dL (ref 0.0–0.4)
Alpha-2-Globulin: 0.5 g/dL (ref 0.4–1.0)
Beta Globulin: 0.9 g/dL (ref 0.7–1.3)
Gamma Globulin: 1.2 g/dL (ref 0.4–1.8)
Globulin, Total: 2.7 g/dL (ref 2.2–3.9)
Total Protein ELP: 6.6 g/dL (ref 6.0–8.5)

## 2023-05-24 LAB — ANTINUCLEAR ANTIBODIES, IFA: ANA Ab, IFA: NEGATIVE

## 2023-05-28 ENCOUNTER — Inpatient Hospital Stay: Payer: 59 | Admitting: Physician Assistant

## 2023-06-05 ENCOUNTER — Other Ambulatory Visit: Payer: Self-pay | Admitting: Family Medicine

## 2023-06-08 ENCOUNTER — Other Ambulatory Visit: Payer: Self-pay | Admitting: Physician Assistant

## 2023-06-08 ENCOUNTER — Other Ambulatory Visit: Payer: Self-pay

## 2023-06-08 DIAGNOSIS — K55069 Acute infarction of intestine, part and extent unspecified: Secondary | ICD-10-CM

## 2023-06-08 MED ORDER — RIVAROXABAN 10 MG PO TABS: 10.0000 mg | ORAL_TABLET | Freq: Every day | ORAL | 3 refills | Status: DC

## 2023-06-12 NOTE — Progress Notes (Unsigned)
[facility] [address], [place] [address]   CLINIC:  Medical Oncology/Hematology  PCP:  Mechele Claude, MD [address] [place] [address] [phone]   REASON FOR VISIT: Splanchnic vein thrombosis and iron deficiency anemia   CURRENT THERAPY: Xarelto   INTERVAL HISTORY:  James [name] is contacted today for follow-up of his splanchnic vein thrombosis and iron deficiency anemia.  He was last evaluated via telemedicine visit by Rojelio Brenner PA-C on 02/13/2023.   At today's visit, he reports feeling fairly well.  *** *** He continues to have ongoing abdominal pain and alternating diarrhea and constipation, persistent since small bowel resection in 2022. *** He denies any nausea, vomiting, or new onset abdominal symptoms. *** He continues to take both Xarelto and testosterone. *** He does have occasional black and tarry bowel movements, most recently noticed this last week. *** He reports some mild fatigue, denies any ice pica. *** He restarted iron tablet after phone visit in February 2024.  Continues to take vitamin B12 1000 mcg daily.  ***  *** Right axillary lymphadenopathy? *** Infections *** B symptoms *** Lumps or bumps  He reports *** energy and *** appetite.  He has been maintaining a stable weight since his last visit.  ASSESSMENT & PLAN:  1.  Splanchnic vein thrombosis - Seen at the request of Dr. Isaiah Serge (benign hematology) at [facility] - Preceded by 2 months of diarrhea and 2 weeks of abdominal pain - Diagnosed 06/13/2021: Thrombosis of SMV, several jejunal veins, splenic vein, main portal vein, central right and left intrahepatic portal veins - VTE risk factors: Testosterone therapy x 10 years, with secondary erythrocytosis at the time of presentation (Hgb 17.3 on 05/27/2021) Preceding diverticulitis  - Small bowel resection, approximately 55 cm (06/14/2021) - IR intervention with portal vein thrombectomy and angioplasty on  06/15/2021 - Treated with >6 months of Xarelto 20 mg daily, switched to prophylactic dose (Xarelto 10 mg daily) in May 2023 - JAK2 V617F testing negative (July 2022).  Antiphospholipid antibody testing triple negative. - Patient expressed desire to come off of Xarelto, but this was advised against. [facility] hematology (Dr. Isaiah Serge) favored continued prophylactic anticoagulant, especially since patient is back on testosterone treatment now. - Per Dr. Ellin Saba, indefinite anticoagulation favored due to unprovoked extensive splanchnic vein thrombosis with etiology unclear. - D-dimer (02/02/2023): Normal at 0.32.  Creatinine 1.25 with GFR >60. - PLAN: Continue Xarelto 10 mg daily indefinitely   2.  Iron deficiency anemia - Developed iron deficiency anemia in November 2022, again in May 2023. - EGD (11/22/2021): Very subtle grade 1 varices found in lower third of esophagus, small in size - Colonoscopy (11/22/2021): Polyps x 10 throughout colon removed - EGD (02/06/2023): Grade 1 esophageal varices, 1 cm hiatal hernia, portal hypertensive gastropathy - His previous hematologist (Dr. Isaiah Serge of [facility]) recommended IV iron therapy, but this was not covered by insurance therefore patient declines. - Taking ferrous sulfate 325 mg daily.  *** - Most recent labs (06/12/2023): Hgb 16.3/MCV 91.7.  Ferritin 44, iron saturation 27% with normal TIBC.  He has mild CKD with creatinine 1.40/GFR 59 - Intermittent black tarry bowel movements*** - Likely has some underlying malabsorption due to small bowel resection in June 2022 *** - PLAN: Continue ferrous sulfate 325 mg daily.  Recheck CBC with iron panel in 3 months.  *** PHONE visit ***   3.  Mild thrombocytopenia - Mild thrombocytopenia since November 2022, unclear etiology - PNH screen from  [facility] hematology was negative in July 2022 - No associated anemia or white cell count abnormalities to suggest primary bone marrow problem such as myelofibrosis - CTAP (10/07/2021): Spleen  size normal - Prior testing was negative for hepatitis C and HIV. - Additional labs (05/21/2023): Hepatitis B negative. Normal ANA and rheumatoid factor. Immature platelet fraction normal at 7.2%. SPEP normal.  Minimally elevated kappa free light chain 21.7, normal lambda 19.4, normal ratio 1.12. COPPER DEFICIENCY (serum copper low at 54).  Normal B12, MMA.  Normal folate. - Patient taking vitamin B12 1000 mcg daily (labs from 05/21/2023 show normal B12 at 866, normal MMA) - Most recent CBC/D (05/21/2023): Platelets 131 - PLAN: Recommend starting copper 4 mg daily. - We will recheck copper and CBC/D in 3 months *** PHONE visit *** *** Look up common causes of copper deficiency *** likely malabsorption related to partial small bowel resection in June 2022  4.  Lymphadenopathy (right axillary)*** - Subcentimeter lymph node in the right axillary region noted by Dr. Ellin Saba on 02/02/2023 - PLAN: Office visit in 3 months to follow-up on lymphadenopathy.  Patient advised to call sooner if he notices any obvious enlargement or new lymphadenopathy.   5.  Hypogonadism, on testosterone supplementation*** - Testosterone supplementation was stopped after he was diagnosed with splanchnic vein thrombosis - He restarted in May 2023, currently taking testosterone 100 mg every 7 days - Most recent testosterone level 991 (02/02/2023) - PLAN: Continue management as per Dr. Darlyn Read (PCP)   6.  Other history - He is retired Emergency planning/management officer.  Non-smoker. - No family history of DVT/PE. - Maternal grandmother had stomach cancer.   PLAN SUMMARY:*** >>*** ***    REVIEW OF SYSTEMS: ***  Review of Systems - Oncology   PHYSICAL EXAM:  ECOG PERFORMANCE STATUS: {CHL ONC ECOG AV:[id number]} *** There were no vitals filed for this visit. There were no vitals filed for this visit. Physical Exam  PAST MEDICAL/SURGICAL HISTORY:  Past Medical History:  Diagnosis Date   Arthritis    Hx of exploratory laparotomy     enterectomy small intest. resection,celiotomy   Hyperlipidemia    Hypertension    Hypothyroidism    Pre-diabetes    Sleep apnea    Past Surgical History:  Procedure Laterality Date   BACK SURGERY     BIOPSY  11/22/2021   Procedure: BIOPSY;  Surgeon: Dolores Frame, MD;  [facility]: [facility];  Service: Gastroenterology;;   COLONOSCOPY WITH PROPOFOL N/A 11/22/2021   Procedure: COLONOSCOPY WITH PROPOFOL;  Surgeon: Dolores Frame, MD;  Location: [facility];  Service: Gastroenterology;  Laterality: N/A;  8:45   ESOPHAGOGASTRODUODENOSCOPY (EGD) WITH PROPOFOL N/A 11/22/2021   Procedure: ESOPHAGOGASTRODUODENOSCOPY (EGD) WITH PROPOFOL;  Surgeon: Dolores Frame, MD;  [facility]: [facility];  Service: Gastroenterology;  Laterality: N/A;   ESOPHAGOGASTRODUODENOSCOPY (EGD) WITH PROPOFOL N/A 02/06/2023   Procedure: ESOPHAGOGASTRODUODENOSCOPY (EGD) WITH PROPOFOL;  Surgeon: Dolores Frame, MD;  [facility]: [facility];  Service: Gastroenterology;  Laterality: N/A;  2:15 pm   INCISIONAL HERNIA REPAIR N/A 11/23/2021   Procedure: OPEN COMPONENT SEPARATION AND MESH ABDOMINAL WALL RECONSTRUCTION FOR INCISIONAL HERNIA REPAIR, LYSIS OF ADHESIONS AND TRANSABDOMINAL PLANE BLOCK;  Surgeon: Karie Soda, MD;  Location: [facility];  Service: General;  Laterality: N/A;   POLYPECTOMY  11/22/2021   Procedure: POLYPECTOMY;  Surgeon: Dolores Frame, MD;  Location: [facility];  Service: Gastroenterology;;   TONSILLECTOMY      SOCIAL HISTORY:  Social History  Socioeconomic History   Marital status: Married    Spouse name: Not on file   Number of children: Not on file   Years of education: Not on file   Highest education level: Not on file  Occupational History   Not on file  Tobacco Use   Smoking status: Never    Passive exposure: Never   Smokeless tobacco: Never  Vaping Use   Vaping Use: Never used  Substance and Sexual Activity   Alcohol use: No    Drug use: No   Sexual activity: Yes    Birth control/protection: None  Other Topics Concern   Not on file  Social History Narrative   He is currently a Lawyer.    Social Determinants of Health   Financial Resource Strain: Not on file  Food Insecurity: No Food Insecurity (02/02/2023)   Hunger Vital Sign    Worried About Running Out of Food in the Last Year: Never true    Ran Out of Food in the Last Year: Never true  Transportation Needs: No Transportation Needs (02/02/2023)   PRAPARE - Administrator, Civil Service (Medical): No    Lack of Transportation (Non-Medical): No  Physical Activity: Not on file  Stress: Not on file  Social Connections: Not on file  Intimate Partner Violence: Not At Risk (02/02/2023)   Humiliation, Afraid, Rape, and Kick questionnaire    Fear of Current or Ex-Partner: No    Emotionally Abused: No    Physically Abused: No    Sexually Abused: No    FAMILY HISTORY:  Family History  Problem Relation Age of Onset   COPD Mother    Alzheimer's disease Father    Stomach cancer Maternal Grandmother     CURRENT MEDICATIONS:  Outpatient Encounter Medications as of 06/13/2023  Medication Sig   rivaroxaban (XARELTO) 10 MG TABS tablet Take 1 tablet (10 mg total) by mouth daily.   acidophilus (RISAQUAD) CAPS capsule Take 1 capsule by mouth daily.   aspirin EC 81 MG tablet Take 81 mg by mouth daily. Swallow whole.   atorvastatin (LIPITOR) 40 MG tablet TAKE ONE (1) TABLET EACH DAY   blood glucose meter kit and supplies Dispense based on patient and insurance preference. Use up to four times daily as directed. (FOR ICD-10 E10.9, E11.9).   carvedilol (COREG) 3.125 MG tablet Take 1 tablet (3.125 mg total) by mouth 2 (two) times daily with a meal.   fenofibrate 160 MG tablet TAKE ONE (1) TABLET EACH DAY   fluticasone (FLONASE) 50 MCG/ACT nasal spray USE 1 SPRAY IN BOTH NOSTRILS TWICE DAILYAS NEEDED FOR ALLERGIES OR RHINITIS   levothyroxine  (SYNTHROID) 50 MCG tablet TAKE ONE (1) TABLET EACH DAY   sildenafil (REVATIO) 20 MG tablet Take 2-5 pills at once, orally, with each sexual encounter   testosterone cypionate (DEPOTESTOSTERONE CYPIONATE) 200 MG/ML injection INJECT 0.5ML IM EVERY 7 DAYS   valsartan-hydrochlorothiazide (DIOVAN-HCT) 320-25 MG tablet TAKE ONE TABLET BY MOUTH EACH DAY FOR BLOOD PRESSURE   No facility-administered encounter medications on file as of 06/13/2023.    ALLERGIES:  Allergies  Allergen Reactions   Metformin And Related     Loose bloody stools    LABORATORY DATA:  I have reviewed the labs as listed.  CBC    Component Value Date/Time   WBC 8.3 05/21/2023 0752   RBC 5.20 05/21/2023 0752   HGB 16.3 05/21/2023 0752   HGB 15.4 03/05/2023 1339   HCT 47.7 05/21/2023 0752  HCT 46.9 03/05/2023 1339   PLT 131 (L) 05/21/2023 0752   PLT 123 (L) 03/05/2023 1339   MCV 91.7 05/21/2023 0752   MCV 92 03/05/2023 1339   MCH 31.3 05/21/2023 0752   MCHC 34.2 05/21/2023 0752   RDW 14.3 05/21/2023 0752   RDW 14.4 03/05/2023 1339   LYMPHSABS 1.2 05/21/2023 0752   LYMPHSABS 1.1 03/05/2023 1339   MONOABS 0.7 05/21/2023 0752   EOSABS 0.3 05/21/2023 0752   EOSABS 0.2 03/05/2023 1339   BASOSABS 0.1 05/21/2023 0752   BASOSABS 0.1 03/05/2023 1339      Latest Ref Rng & Units 05/21/2023    7:52 AM 03/05/2023    1:39 PM 02/01/2023   10:04 AM  CMP  Glucose 70 - 99 mg/dL 147  829  562   BUN 6 - 20 mg/dL 16  16  20    Creatinine 0.61 - 1.24 mg/dL 1.30  8.65  7.84   Sodium 135 - 145 mmol/L 134  138  135   Potassium 3.5 - 5.1 mmol/L 3.3  4.1  3.5   Chloride 98 - 111 mmol/L 99  100  102   CO2 22 - 32 mmol/L 25  24  25    Calcium 8.9 - 10.3 mg/dL 9.3  9.7  9.5   Total Protein 6.5 - 8.1 g/dL 7.1  6.8    Total Bilirubin 0.3 - 1.2 mg/dL 1.8  1.1    Alkaline Phos 38 - 126 U/L 40  43    AST 15 - 41 U/L 29  35    ALT 0 - 44 U/L 39  41      DIAGNOSTIC IMAGING:  I have independently reviewed the relevant imaging and  discussed with the patient.   WRAP UP:  All questions were answered. The patient knows to call the clinic with any problems, questions or concerns.  Medical decision making: ***  Time spent on visit: I spent *** minutes counseling the patient face to face. The total time spent in the appointment was *** minutes and more than 50% was on counseling.  Carnella Guadalajara, PA-C  ***

## 2023-06-13 ENCOUNTER — Other Ambulatory Visit: Payer: Self-pay

## 2023-06-13 ENCOUNTER — Inpatient Hospital Stay: Payer: 59 | Admitting: Physician Assistant

## 2023-06-13 VITALS — BP 137/60 | HR 77 | Temp 97.8°F | Resp 17 | Ht 73.0 in | Wt 241.3 lb

## 2023-06-13 DIAGNOSIS — E610 Copper deficiency: Secondary | ICD-10-CM | POA: Diagnosis not present

## 2023-06-13 DIAGNOSIS — D509 Iron deficiency anemia, unspecified: Secondary | ICD-10-CM | POA: Diagnosis not present

## 2023-06-13 DIAGNOSIS — I82890 Acute embolism and thrombosis of other specified veins: Secondary | ICD-10-CM | POA: Diagnosis not present

## 2023-06-13 DIAGNOSIS — E291 Testicular hypofunction: Secondary | ICD-10-CM | POA: Diagnosis not present

## 2023-06-13 DIAGNOSIS — Z79890 Hormone replacement therapy (postmenopausal): Secondary | ICD-10-CM | POA: Diagnosis not present

## 2023-06-13 DIAGNOSIS — D500 Iron deficiency anemia secondary to blood loss (chronic): Secondary | ICD-10-CM

## 2023-06-13 DIAGNOSIS — K55069 Acute infarction of intestine, part and extent unspecified: Secondary | ICD-10-CM | POA: Diagnosis not present

## 2023-06-13 DIAGNOSIS — Z7901 Long term (current) use of anticoagulants: Secondary | ICD-10-CM | POA: Diagnosis not present

## 2023-06-13 DIAGNOSIS — D696 Thrombocytopenia, unspecified: Secondary | ICD-10-CM | POA: Diagnosis not present

## 2023-06-13 DIAGNOSIS — I81 Portal vein thrombosis: Secondary | ICD-10-CM | POA: Diagnosis not present

## 2023-06-13 DIAGNOSIS — N182 Chronic kidney disease, stage 2 (mild): Secondary | ICD-10-CM | POA: Diagnosis not present

## 2023-06-13 MED ORDER — COPPER CAPS 2 MG PO CAPS
ORAL_CAPSULE | ORAL | 1 refills | Status: AC
Start: 2023-06-13 — End: 2023-10-02

## 2023-06-13 MED ORDER — RIVAROXABAN 10 MG PO TABS: 10.0000 mg | ORAL_TABLET | Freq: Every day | ORAL | 3 refills | Status: DC

## 2023-06-13 NOTE — Patient Instructions (Signed)
[facility] **VISIT SUMMARY & IMPORTANT INSTRUCTIONS **   You were seen today by Rojelio Brenner PA-C for your follow-up visit.    HISTORY OF BLOOD CLOTS Continue to take Xarelto 10 mg daily. You will need to be on a blood thinner indefinitely, since her blood clots were likely unprovoked.  LOW PLATELETS You have had mildly low platelets for the past several months. Your lab tests showed LOW COPPER, which is likely the cause of your low platelets. I suspect that your low copper is related to malabsorption from your previous small bowel resection. We will start you on copper supplement as follows: Take 8 mg copper daily x 7 days Then take 6 mg copper daily x 7 days Then take 4 mg copper daily x 7 days Then take 2 mg copper indefinitely I will send prescription for copper 2 mg tablets to your pharmacy, but these can sometimes be difficult to find.  You may need to order this online through [employer] or other online store.  Any copper supplement dosed at 2 mg will be sufficient.  IRON & B12 DEFICIENCY Continue taking iron tablet (ferrous sulfate 325 mg) every day. Continue taking vitamin B12 supplement daily.  LYMPH NODE You did not have any obvious lymph node swelling on your exam today.  FOLLOW-UP APPOINTMENT: Labs in 3 months followed by phone visit  ** Thank you for trusting me with your healthcare!  I strive to provide all of my patients with quality care at each visit.  If you receive a survey for this visit, I would be so grateful to you for taking the time to provide feedback.  Thank you in advance!  ~ Teal Raben                   Dr. Doreatha Massed   &   Rojelio Brenner, PA-C   - - - - - - - - - - - - - - - - - -    Thank you for choosing [facility] at [facility] to provide your oncology and hematology care.  To afford each patient quality time with our provider, please arrive at least 15 minutes before your  scheduled appointment time.   If you have a lab appointment with the Cancer Center please come in thru the Main Entrance and check in at the main information desk.  You need to re-schedule your appointment should you arrive 10 or more minutes late.  We strive to give you quality time with our providers, and arriving late affects you and other patients whose appointments are after yours.  Also, if you no show three or more times for appointments you may be dismissed from the clinic at the providers discretion.     Again, thank you for choosing [facility].  Our hope is that these requests will decrease the amount of time that you wait before being seen by our physicians.       _____________________________________________________________  Should you have questions after your visit to [facility], please contact our office at [phone] and follow the prompts.  Our office hours are 8:00 a.m. and 4:30 p.m. Monday - Friday.  Please note that voicemails left after 4:00 p.m. may not be returned until the following business day.  We are closed weekends and major holidays.  You do have access to a nurse 24-7, just call the main number to the clinic [phone] and  do not press any options, hold on the line and a nurse will answer the phone.    For prescription refill requests, have your pharmacy contact our office and allow 72 hours.

## 2023-06-18 ENCOUNTER — Other Ambulatory Visit (INDEPENDENT_AMBULATORY_CARE_PROVIDER_SITE_OTHER): Payer: Self-pay | Admitting: Gastroenterology

## 2023-06-18 DIAGNOSIS — I152 Hypertension secondary to endocrine disorders: Secondary | ICD-10-CM

## 2023-06-18 DIAGNOSIS — I8500 Esophageal varices without bleeding: Secondary | ICD-10-CM

## 2023-06-18 DIAGNOSIS — I81 Portal vein thrombosis: Secondary | ICD-10-CM

## 2023-06-18 DIAGNOSIS — K580 Irritable bowel syndrome with diarrhea: Secondary | ICD-10-CM

## 2023-06-18 DIAGNOSIS — I8510 Secondary esophageal varices without bleeding: Secondary | ICD-10-CM

## 2023-06-18 DIAGNOSIS — E1159 Type 2 diabetes mellitus with other circulatory complications: Secondary | ICD-10-CM

## 2023-06-20 ENCOUNTER — Other Ambulatory Visit: Payer: Self-pay | Admitting: *Deleted

## 2023-06-20 DIAGNOSIS — K55069 Acute infarction of intestine, part and extent unspecified: Secondary | ICD-10-CM

## 2023-06-20 MED ORDER — RIVAROXABAN 10 MG PO TABS
10.0000 mg | ORAL_TABLET | Freq: Every day | ORAL | 3 refills | Status: DC
Start: 2023-06-20 — End: 2024-02-11

## 2023-07-17 ENCOUNTER — Other Ambulatory Visit: Payer: Self-pay | Admitting: Family Medicine

## 2023-07-17 DIAGNOSIS — E1169 Type 2 diabetes mellitus with other specified complication: Secondary | ICD-10-CM

## 2023-08-21 ENCOUNTER — Other Ambulatory Visit (INDEPENDENT_AMBULATORY_CARE_PROVIDER_SITE_OTHER): Payer: Self-pay | Admitting: Gastroenterology

## 2023-08-21 DIAGNOSIS — I81 Portal vein thrombosis: Secondary | ICD-10-CM

## 2023-08-21 DIAGNOSIS — I8510 Secondary esophageal varices without bleeding: Secondary | ICD-10-CM

## 2023-08-21 DIAGNOSIS — I152 Hypertension secondary to endocrine disorders: Secondary | ICD-10-CM

## 2023-08-21 DIAGNOSIS — K580 Irritable bowel syndrome with diarrhea: Secondary | ICD-10-CM

## 2023-08-21 DIAGNOSIS — I8500 Esophageal varices without bleeding: Secondary | ICD-10-CM

## 2023-08-21 DIAGNOSIS — E1159 Type 2 diabetes mellitus with other circulatory complications: Secondary | ICD-10-CM

## 2023-08-21 NOTE — Telephone Encounter (Signed)
Last seen march 2024

## 2023-08-28 ENCOUNTER — Encounter: Payer: Self-pay | Admitting: Family Medicine

## 2023-08-28 ENCOUNTER — Ambulatory Visit: Payer: BC Managed Care – PPO | Admitting: Family Medicine

## 2023-08-28 VITALS — BP 120/57 | HR 85 | Temp 98.2°F | Ht 73.0 in | Wt 245.4 lb

## 2023-08-28 DIAGNOSIS — E1159 Type 2 diabetes mellitus with other circulatory complications: Secondary | ICD-10-CM

## 2023-08-28 DIAGNOSIS — D485 Neoplasm of uncertain behavior of skin: Secondary | ICD-10-CM

## 2023-08-28 DIAGNOSIS — E039 Hypothyroidism, unspecified: Secondary | ICD-10-CM | POA: Diagnosis not present

## 2023-08-28 DIAGNOSIS — E1169 Type 2 diabetes mellitus with other specified complication: Secondary | ICD-10-CM | POA: Diagnosis not present

## 2023-08-28 DIAGNOSIS — I152 Hypertension secondary to endocrine disorders: Secondary | ICD-10-CM

## 2023-08-28 DIAGNOSIS — E119 Type 2 diabetes mellitus without complications: Secondary | ICD-10-CM

## 2023-08-28 DIAGNOSIS — E291 Testicular hypofunction: Secondary | ICD-10-CM | POA: Diagnosis not present

## 2023-08-28 DIAGNOSIS — E785 Hyperlipidemia, unspecified: Secondary | ICD-10-CM | POA: Diagnosis not present

## 2023-08-28 LAB — BAYER DCA HB A1C WAIVED: HB A1C (BAYER DCA - WAIVED): 5.8 % — ABNORMAL HIGH (ref 4.8–5.6)

## 2023-08-28 MED ORDER — ATORVASTATIN CALCIUM 40 MG PO TABS
ORAL_TABLET | ORAL | 3 refills | Status: AC
Start: 2023-08-28 — End: ?

## 2023-08-28 MED ORDER — LEVOTHYROXINE SODIUM 50 MCG PO TABS
ORAL_TABLET | ORAL | 3 refills | Status: AC
Start: 2023-08-28 — End: ?

## 2023-08-28 NOTE — Progress Notes (Signed)
Subjective:  Patient ID: James [name], male    DOB: 08/05/1966  Age: 56 y.o. MRN: [id number]  CC: Medical Management of Chronic Issues   HPI James [name] presents for  in for follow-up of elevated cholesterol. Doing well without complaints on current medication. Denies side effects of statin including myalgia and arthralgia and nausea. Currently no chest pain, shortness of breath or other cardiovascular related symptoms noted.   presents for  follow-up of hypertension. Patient has no history of headache chest pain or shortness of breath or recent cough. Patient also denies symptoms of TIA such as focal numbness or weakness. Patient denies side effects from medication. States taking it regularly.  presents forFollow-up of diabetes.A1c climbed to 8.2 2 years ago. Has been good since then. Patient denies symptoms such as polyuria, polydipsia, excessive hunger, nausea No significant hypoglycemic spells noted. Medications reviewed. Pt reports taking them regularly without complication/adverse reaction being reported today.  Lab Results  Component Value Date   HGBA1C 5.4 03/05/2023   HGBA1C 5.4 09/04/2022   HGBA1C 5.1 03/02/2022   Pt. Has lesion o right cheek for 1 years. A scaly area on right hand for an indeterminant time. Requesting referral to Dr. Margo Aye of Derm.    follow-up on  thyroid. The patient has a history of hypothyroidism for many years. It has been stable recently. Pt. denies any change in  voice, loss of hair, heat or cold intolerance. Energy level has been adequate to good. Patient denies constipation and diarrhea. No myxedema. Medication is as noted below. Verified that pt is taking it daily on an empty stomach. Well tolerated.        08/28/2023    9:32 AM 03/05/2023    1:21 PM 02/02/2023    8:36 AM  Depression screen PHQ 2/9  Decreased Interest 0 0 0  Down, Depressed, Hopeless 0 0 0  PHQ - 2 Score 0 0 0    History James [name] has a past medical history of Arthritis,  exploratory laparotomy, Hyperlipidemia, Hypertension, Hypothyroidism, Pre-diabetes, and Sleep apnea.   He has a past surgical history that includes Back surgery; Tonsillectomy; Incisional hernia repair (N/A, 11/23/2021); Colonoscopy with propofol (N/A, 11/22/2021); Esophagogastroduodenoscopy (egd) with propofol (N/A, 11/22/2021); polypectomy (11/22/2021); biopsy (11/22/2021); and Esophagogastroduodenoscopy (egd) with propofol (N/A, 02/06/2023).   His family history includes Alzheimer's disease in his father; COPD in his mother; Stomach cancer in his maternal grandmother.He reports that he has never smoked. He has never been exposed to tobacco smoke. He has never used smokeless tobacco. He reports that he does not drink alcohol and does not use drugs.    ROS Review of Systems  Constitutional:  Negative for fever.  Respiratory:  Negative for shortness of breath.   Cardiovascular:  Negative for chest pain.  Musculoskeletal:  Negative for arthralgias.  Skin:  Negative for rash.    Objective:  BP (!) 120/57   Pulse 85   Temp 98.2 F (36.8 C)   Ht 6\' 1"  (1.854 m)   Wt 245 lb 6.4 oz (111.3 kg)   SpO2 98%   BMI 32.38 kg/m   BP Readings from Last 3 Encounters:  08/28/23 (!) 120/57  06/13/23 137/60  03/08/23 118/72    Wt Readings from Last 3 Encounters:  08/28/23 245 lb 6.4 oz (111.3 kg)  06/13/23 241 lb 4.8 oz (109.5 kg)  03/08/23 247 lb (112 kg)     Physical Exam Vitals reviewed.  Constitutional:      Appearance: He is well-developed.  HENT:     Head: Normocephalic and atraumatic.     Right Ear: External ear normal.     Left Ear: External ear normal.     Mouth/Throat:     Pharynx: No oropharyngeal exudate or posterior oropharyngeal erythema.  Eyes:     Pupils: Pupils are equal, round, and reactive to light.  Cardiovascular:     Rate and Rhythm: Normal rate and regular rhythm.     Heart sounds: No murmur heard. Pulmonary:     Effort: No respiratory distress.     Breath  sounds: Normal breath sounds.  Musculoskeletal:     Cervical back: Normal range of motion and neck supple.  Skin:    General: Skin is warm and dry.     Findings: Lesion present.  Neurological:     Mental Status: He is alert and oriented to person, place, and time.    Right hand     Right cheek  Assessment & Plan:   James [name] was seen today for medical management of chronic issues.  Diagnoses and all orders for this visit:  Type 2 diabetes mellitus without complication, without long-term current use of insulin (HCC) -     Bayer DCA Hb A1c Waived  Hyperlipidemia associated with type 2 diabetes mellitus (HCC) -     Lipid panel -     atorvastatin (LIPITOR) 40 MG tablet; TAKE ONE (1) TABLET EACH DAY  Hypertension associated with diabetes (HCC) -     CBC with Differential/Platelet -     CMP14+EGFR  Acquired hypothyroidism -     TSH + free T4 -     levothyroxine (SYNTHROID) 50 MCG tablet; TAKE ONE (1) TABLET EACH DAY  Neoplasm of uncertain behavior of skin -     Ambulatory referral to Dermatology  Hypogonadism in male -     Testosterone,Free and Total       I am having Eligh Bing Neighbors maintain his blood glucose meter kit and supplies, sildenafil, fluticasone, aspirin EC, acidophilus, valsartan-hydrochlorothiazide, fenofibrate, testosterone cypionate, Copper Caps, rivaroxaban, carvedilol, atorvastatin, and levothyroxine.  Allergies as of 08/28/2023       Reactions   Metformin And Related    Loose bloody stools        Medication List        Accurate as of August 28, 2023 10:23 AM. If you have any questions, ask your nurse or doctor.          acidophilus Caps capsule Take 1 capsule by mouth daily.   aspirin EC 81 MG tablet Take 81 mg by mouth daily. Swallow whole.   atorvastatin 40 MG tablet Commonly known as: LIPITOR TAKE ONE (1) TABLET EACH DAY   blood glucose meter kit and supplies Dispense based on patient and insurance preference. Use up to  four times daily as directed. (FOR ICD-10 E10.9, E11.9).   carvedilol 3.125 MG tablet Commonly known as: COREG TAKE 1 TABLET BY MOUTH TWICE DAILY WITH MEALS   Copper Caps 2 MG Caps Generic drug: Copper Gluconate Take 8 mg by mouth daily for 7 days, THEN 6 mg daily for 7 days, THEN 4 mg daily for 7 days, THEN 2 mg daily. Start taking on: June 13, 2023   fenofibrate 160 MG tablet TAKE ONE (1) TABLET EACH DAY   fluticasone 50 MCG/ACT nasal spray Commonly known as: FLONASE USE 1 SPRAY IN BOTH NOSTRILS TWICE DAILYAS NEEDED FOR ALLERGIES OR RHINITIS   levothyroxine 50 MCG tablet Commonly known as: SYNTHROID TAKE ONE (  1) TABLET EACH DAY   rivaroxaban 10 MG Tabs tablet Commonly known as: XARELTO Take 1 tablet (10 mg total) by mouth daily.   sildenafil 20 MG tablet Commonly known as: REVATIO Take 2-5 pills at once, orally, with each sexual encounter   testosterone cypionate 200 MG/ML injection Commonly known as: DEPOTESTOSTERONE CYPIONATE INJECT 0.5ML IM EVERY 7 DAYS   valsartan-hydrochlorothiazide 320-25 MG tablet Commonly known as: DIOVAN-HCT TAKE ONE TABLET BY MOUTH EACH DAY FOR BLOOD PRESSURE         Follow-up: Return in about 6 months (around 02/25/2024).  Mechele Claude, M.D.

## 2023-08-29 LAB — CMP14+EGFR
ALT: 52 IU/L — ABNORMAL HIGH (ref 0–44)
AST: 40 IU/L (ref 0–40)
Albumin: 4.2 g/dL (ref 3.8–4.9)
Alkaline Phosphatase: 42 IU/L — ABNORMAL LOW (ref 44–121)
BUN/Creatinine Ratio: 13 (ref 9–20)
BUN: 20 mg/dL (ref 6–24)
Bilirubin Total: 1.5 mg/dL — ABNORMAL HIGH (ref 0.0–1.2)
CO2: 21 mmol/L (ref 20–29)
Calcium: 10.1 mg/dL (ref 8.7–10.2)
Chloride: 98 mmol/L (ref 96–106)
Creatinine, Ser: 1.54 mg/dL — ABNORMAL HIGH (ref 0.76–1.27)
Globulin, Total: 2.5 g/dL (ref 1.5–4.5)
Glucose: 157 mg/dL — ABNORMAL HIGH (ref 70–99)
Potassium: 4.2 mmol/L (ref 3.5–5.2)
Sodium: 136 mmol/L (ref 134–144)
Total Protein: 6.7 g/dL (ref 6.0–8.5)
eGFR: 53 mL/min/{1.73_m2} — ABNORMAL LOW (ref >59)

## 2023-08-29 LAB — CBC WITH DIFFERENTIAL/PLATELET
Basophils Absolute: 0.1 10*3/uL (ref 0.0–0.2)
Basos: 1 %
EOS (ABSOLUTE): 0.2 10*3/uL (ref 0.0–0.4)
Eos: 2 %
Hematocrit: 52 % — ABNORMAL HIGH (ref 37.5–51.0)
Hemoglobin: 17.4 g/dL (ref 13.0–17.7)
Immature Grans (Abs): 0 10*3/uL (ref 0.0–0.1)
Immature Granulocytes: 0 %
Lymphocytes Absolute: 1.1 10*3/uL (ref 0.7–3.1)
Lymphs: 13 %
MCH: 31.8 pg (ref 26.6–33.0)
MCHC: 33.5 g/dL (ref 31.5–35.7)
MCV: 95 fL (ref 79–97)
Monocytes Absolute: 0.5 10*3/uL (ref 0.1–0.9)
Monocytes: 6 %
Neutrophils Absolute: 6.8 10*3/uL (ref 1.4–7.0)
Neutrophils: 78 %
Platelets: 117 10*3/uL — ABNORMAL LOW (ref 150–450)
RBC: 5.47 x10E6/uL (ref 4.14–5.80)
RDW: 13.1 % (ref 11.6–15.4)
WBC: 8.7 10*3/uL (ref 3.4–10.8)

## 2023-08-29 LAB — LIPID PANEL
Chol/HDL Ratio: 2.7 ratio (ref 0.0–5.0)
Cholesterol, Total: 59 mg/dL — ABNORMAL LOW (ref 100–199)
HDL: 22 mg/dL — ABNORMAL LOW (ref >39)
LDL Chol Calc (NIH): 14 mg/dL (ref 0–99)
Triglycerides: 124 mg/dL (ref 0–149)
VLDL Cholesterol Cal: 23 mg/dL (ref 5–40)

## 2023-08-29 LAB — TSH+FREE T4
Free T4: 0.95 ng/dL (ref 0.82–1.77)
TSH: 2.45 u[IU]/mL (ref 0.450–4.500)

## 2023-08-29 NOTE — Progress Notes (Signed)
Hello James [name],  Your lab result is normal and/or stable.Some minor variations that are not significant are commonly marked abnormal, but do not represent any medical problem for you.Mild weakness of kidneys is stable. Not an issue!  Best regards, Mechele Claude, M.D.

## 2023-09-01 LAB — SPECIMEN STATUS REPORT

## 2023-09-01 LAB — TESTOSTERONE,FREE AND TOTAL
Testosterone, Free: 24.8 pg/mL — ABNORMAL HIGH (ref 7.2–24.0)
Testosterone: 1279 ng/dL — ABNORMAL HIGH (ref 264–916)

## 2023-09-05 ENCOUNTER — Ambulatory Visit: Payer: 59 | Admitting: Family Medicine

## 2023-09-05 IMAGING — DX DG ABDOMEN 1V
2 series · 2 of 2 positions shown · non-contrast
Comparison: CT 10/07/2021

CLINICAL DATA: Incisional hernia. Abdominal pain. Recent hernia
surgery.

EXAM:
ABDOMEN - 1 VIEW

[abdomen kub (1 of 2)]
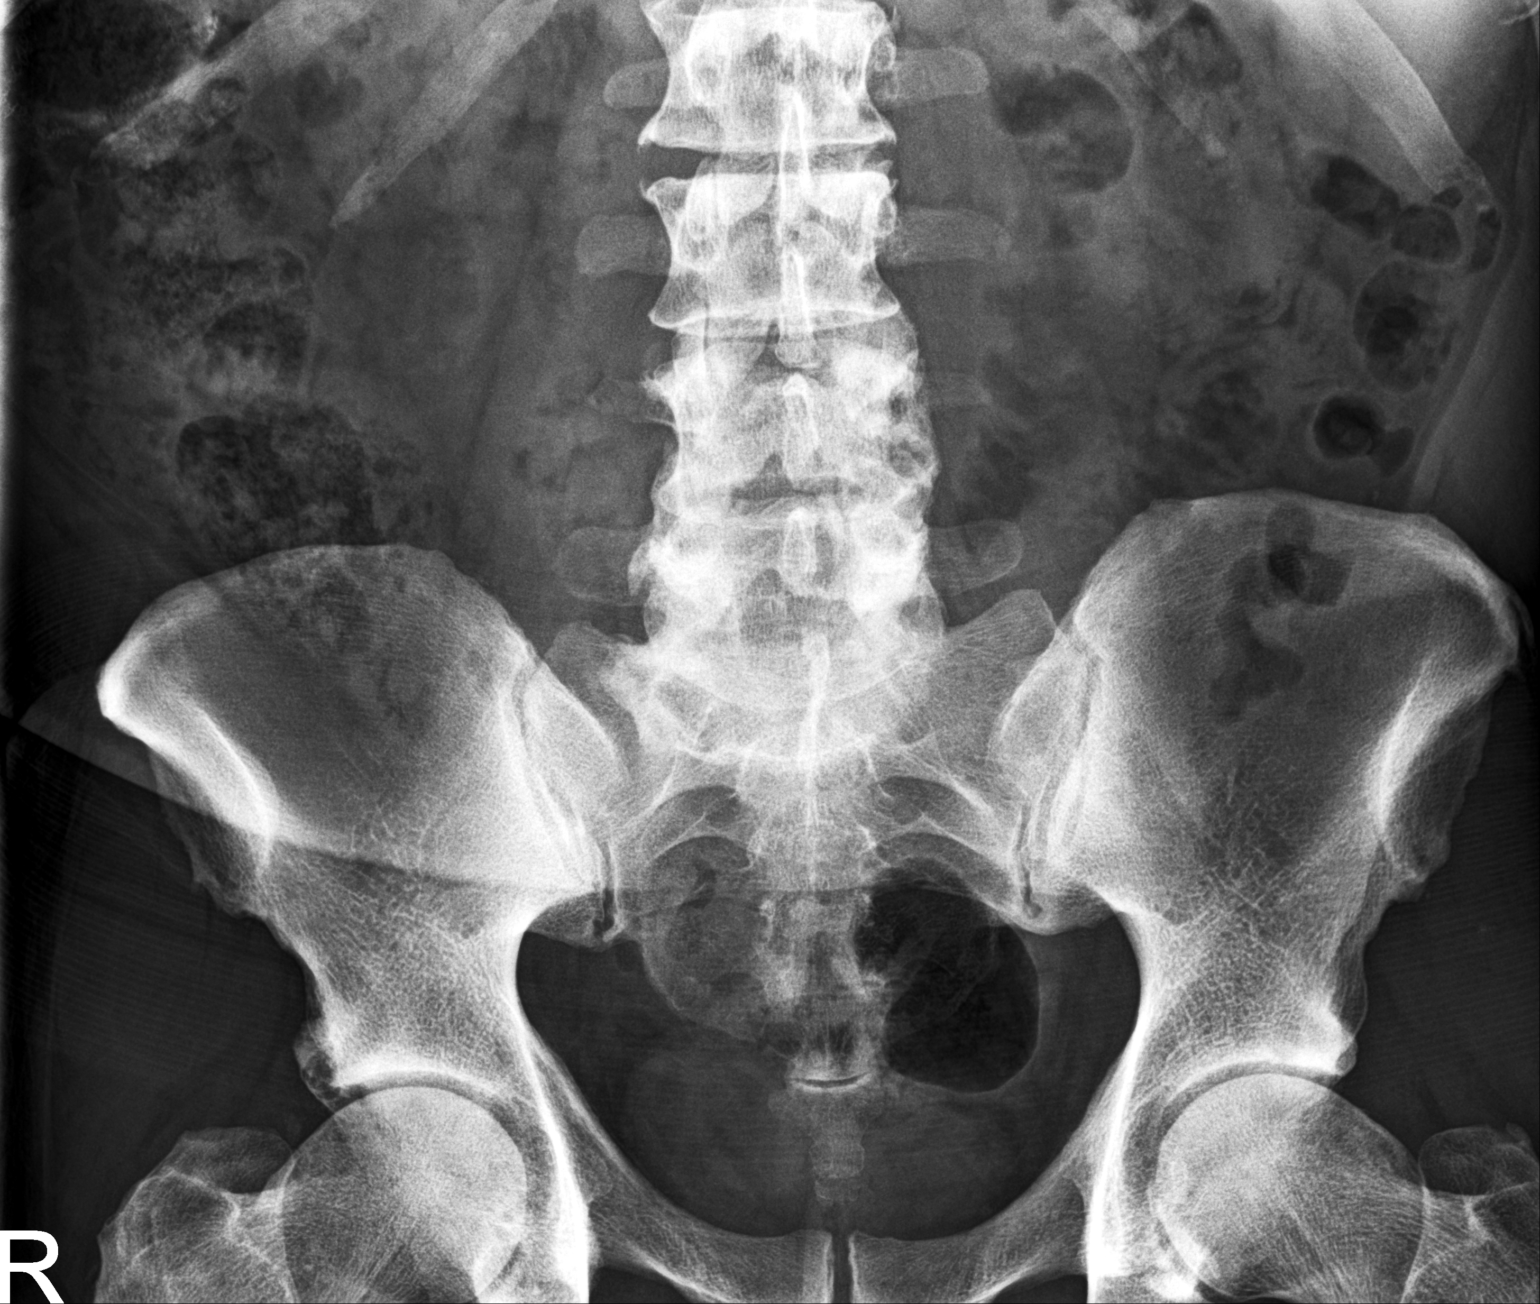

[abdomen kub (2 of 2)]
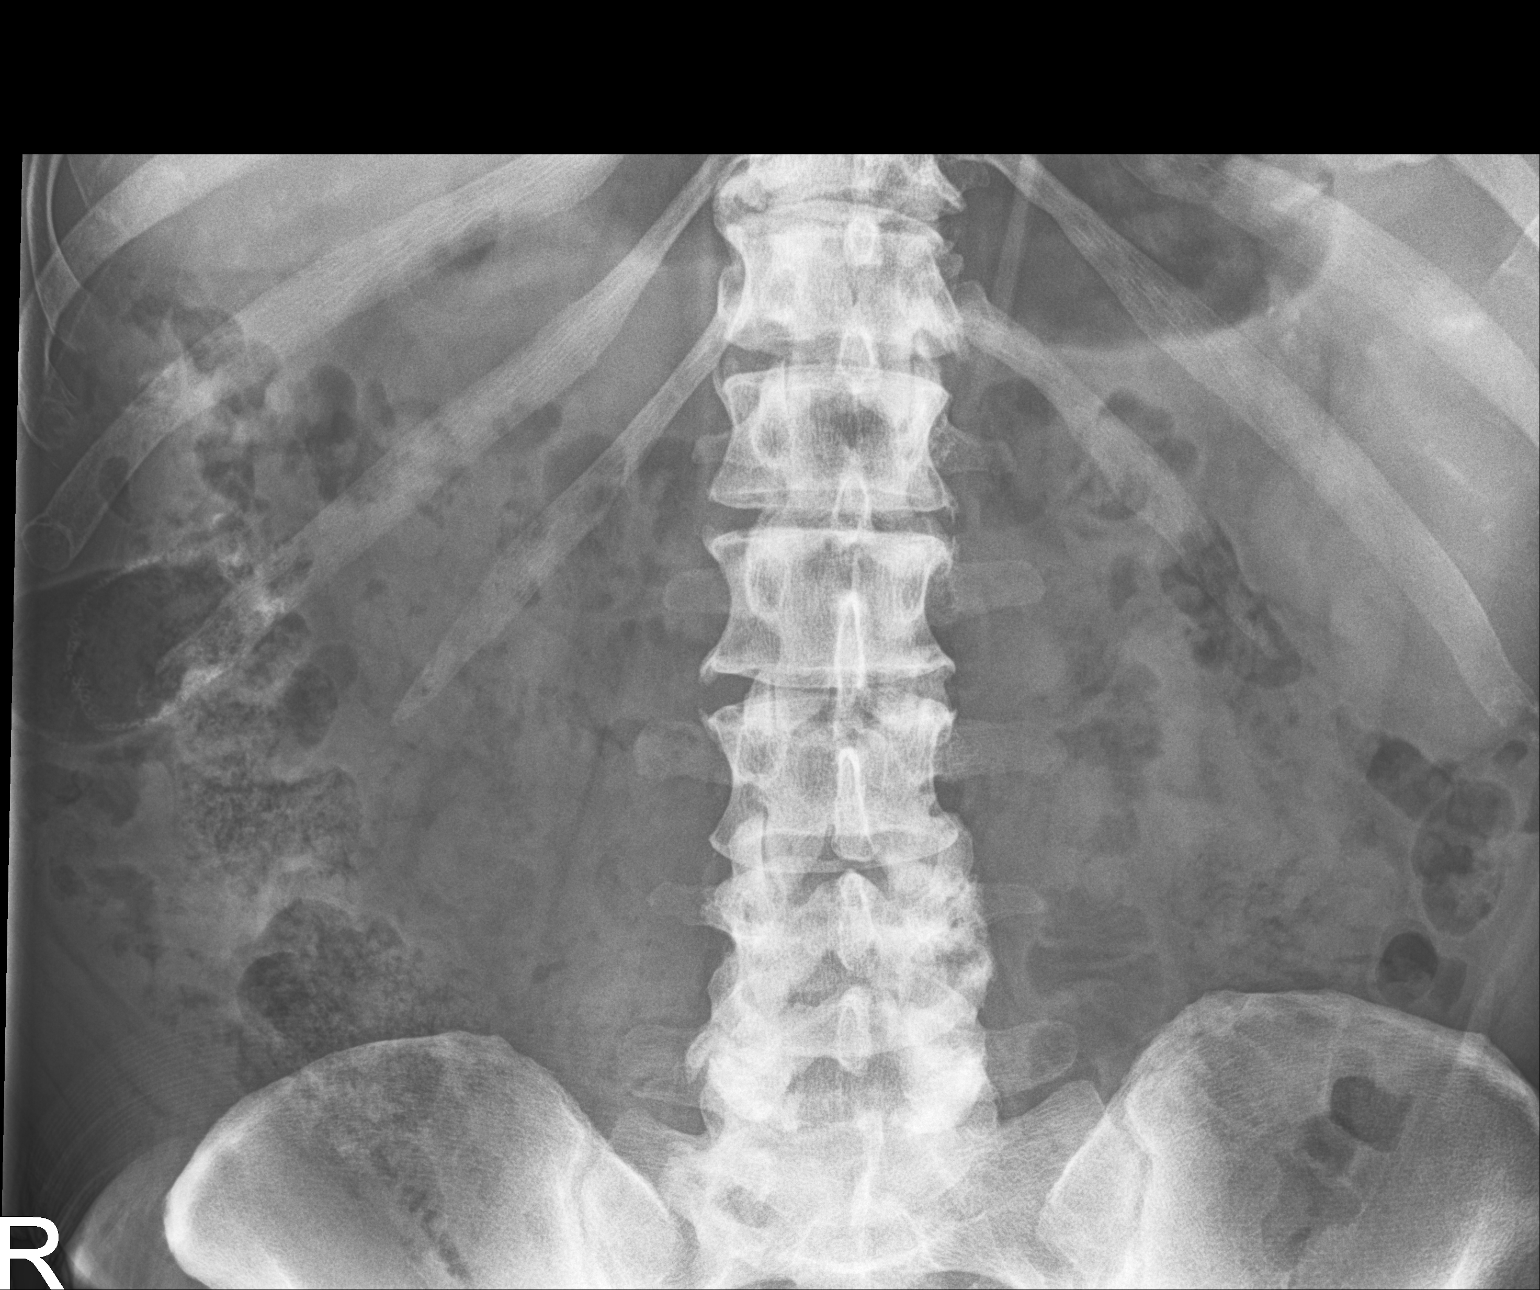

[2 of 2 positions shown; findings below may reference images not displayed]

FINDINGS: No small bowel dilatation or evidence of obstruction. Enteric
sutures noted in the right abdomen. Moderate stool in the ascending
and proximal transverse colon. Small volume of stool distally. No
abnormal rectal distention. No visualized radiopaque calculi. No
acute osseous findings.
IMPRESSION: Moderate stool in the ascending and proximal transverse colon. No
bowel obstruction.

## 2023-09-10 ENCOUNTER — Ambulatory Visit (INDEPENDENT_AMBULATORY_CARE_PROVIDER_SITE_OTHER): Payer: 59 | Admitting: Gastroenterology

## 2023-09-11 ENCOUNTER — Telehealth: Payer: Self-pay | Admitting: Gastroenterology

## 2023-09-11 NOTE — Telephone Encounter (Signed)
Patient left a message to schedule an appt and he sees [address]

## 2023-09-13 ENCOUNTER — Inpatient Hospital Stay: Payer: BC Managed Care – PPO | Attending: Hematology

## 2023-09-13 DIAGNOSIS — D500 Iron deficiency anemia secondary to blood loss (chronic): Secondary | ICD-10-CM

## 2023-09-13 DIAGNOSIS — D509 Iron deficiency anemia, unspecified: Secondary | ICD-10-CM | POA: Insufficient documentation

## 2023-09-13 DIAGNOSIS — D696 Thrombocytopenia, unspecified: Secondary | ICD-10-CM

## 2023-09-13 DIAGNOSIS — K55069 Acute infarction of intestine, part and extent unspecified: Secondary | ICD-10-CM

## 2023-09-13 DIAGNOSIS — E610 Copper deficiency: Secondary | ICD-10-CM

## 2023-09-13 LAB — CBC WITH DIFFERENTIAL/PLATELET
Abs Immature Granulocytes: 0.03 10*3/uL (ref 0.00–0.07)
Basophils Absolute: 0.1 10*3/uL (ref 0.0–0.1)
Basophils Relative: 1 %
Eosinophils Absolute: 0.3 10*3/uL (ref 0.0–0.5)
Eosinophils Relative: 3 %
HCT: 49.6 % (ref 39.0–52.0)
Hemoglobin: 16.6 g/dL (ref 13.0–17.0)
Immature Granulocytes: 0 %
Lymphocytes Relative: 16 %
Lymphs Abs: 1.3 10*3/uL (ref 0.7–4.0)
MCH: 31.4 pg (ref 26.0–34.0)
MCHC: 33.5 g/dL (ref 30.0–36.0)
MCV: 93.9 fL (ref 80.0–100.0)
Monocytes Absolute: 0.6 10*3/uL (ref 0.1–1.0)
Monocytes Relative: 8 %
Neutro Abs: 5.7 10*3/uL (ref 1.7–7.7)
Neutrophils Relative %: 72 %
Platelets: 132 10*3/uL — ABNORMAL LOW (ref 150–400)
RBC: 5.28 MIL/uL (ref 4.22–5.81)
RDW: 13.6 % (ref 11.5–15.5)
WBC: 7.9 10*3/uL (ref 4.0–10.5)
nRBC: 0 % (ref 0.0–0.2)

## 2023-09-13 LAB — IRON AND TIBC
Iron: 168 ug/dL (ref 45–182)
Saturation Ratios: 44 % — ABNORMAL HIGH (ref 17.9–39.5)
TIBC: 386 ug/dL (ref 250–450)
UIBC: 218 ug/dL

## 2023-09-13 LAB — FERRITIN: Ferritin: 69 ng/mL (ref 24–336)

## 2023-09-17 LAB — COPPER, SERUM: Copper: 53 ug/dL — ABNORMAL LOW (ref 69–132)

## 2023-09-19 ENCOUNTER — Encounter (INDEPENDENT_AMBULATORY_CARE_PROVIDER_SITE_OTHER): Payer: Self-pay

## 2023-09-20 ENCOUNTER — Ambulatory Visit (INDEPENDENT_AMBULATORY_CARE_PROVIDER_SITE_OTHER): Payer: BC Managed Care – PPO | Admitting: Gastroenterology

## 2023-09-25 NOTE — Progress Notes (Unsigned)
VIRTUAL VISIT via TELEPHONE NOTE [facility]   I connected with James [name]  on *** at  *** by telephone and verified that I am speaking with the correct person using two identifiers.  Location: Patient: Home Provider: [facility]   I discussed the limitations, risks, security and privacy concerns of performing an evaluation and management service by telephone and the availability of in person appointments. I also discussed with the patient that there may be a patient responsible charge related to this service. The patient expressed understanding and agreed to proceed.  REASON FOR VISIT: ***  PRIOR THERAPY: ***  CURRENT THERAPY: ***  INTERVAL HISTORY:  James [name] is contacted today for follow-up of iron deficiency anemia and splanchnic vein thrombosis.  He was last seen by Rojelio Brenner PA-C on 06/13/2023.  At today's visit, he reports feeling ***.   REVIEW OF SYSTEMS: ***  ROS   PHYSICAL EXAM: (per limitations of virtual telephone visit)  The patient is alert and oriented x 3, exhibiting adequate mentation, good mood, and ability to speak in full sentences and execute sound judgement.***  ASSESSMENT & PLAN:  1.   Iron deficiency anemia - Developed iron deficiency anemia in November 2022, again in May 2023. - EGD (11/22/2021): Very subtle grade 1 varices found in lower third of esophagus, small in size - Colonoscopy (11/22/2021): Polyps x 10 throughout colon removed - EGD (02/06/2023): Grade 1 esophageal varices, 1 cm hiatal hernia, portal hypertensive gastropathy - His previous hematologist (Dr. Isaiah Serge of [facility]) recommended IV iron therapy, but this was not covered by insurance therefore patient declines. - Taking ferrous sulfate 325 mg daily.   - Most recent labs (06/12/2023): Hgb 16.3/MCV 91.7.  Ferritin 44, iron saturation 27% with normal TIBC.  He has mild CKD with creatinine 1.40/GFR 59 - Intermittent black tarry bowel movements -  Likely has some underlying malabsorption due to small bowel resection in June 2022  - PLAN: Continue ferrous sulfate 325 mg daily.  Recheck CBC with iron panel in 3 months followed by PHONE visit  - *** - PLAN: ***  2.  Mild thrombocytopenia - Mild thrombocytopenia since November 2022, unclear etiology - P[facility] screen from [facility] hematology was negative in July 2022 - No associated anemia or white cell count abnormalities to suggest primary bone marrow problem such as myelofibrosis - CTAP (10/07/2021): Spleen size normal - Prior testing was negative for hepatitis C and HIV. - Additional labs (05/21/2023): Hepatitis B negative. Normal ANA and rheumatoid factor. Immature platelet fraction normal at 7.2%. SPEP normal.  Minimally elevated kappa free light chain 21.7, normal lambda 19.4, normal ratio 1.12. COPPER DEFICIENCY (serum copper low at 54).  Normal B12, MMA.  Normal folate. - Patient taking vitamin B12 1000 mcg daily (labs from 05/21/2023 show normal B12 at 866, normal MMA) - Most recent CBC/D (05/21/2023): Platelets 131 - DIFFERENTIAL DIAGNOSIS favors thrombocytopenia secondary to copper deficiency.  Suspect that he has malabsorption of copper in the setting of prior small bowel resection - PLAN: Recommend starting copper supplement, tapered as follows.  (Copper 8 mg x 7 days, copper 6 mg x 7 days, copper 4 mg x 7 days, copper 2 mg indefinitely) - We will recheck copper and CBC/D in 3 months followed by PHONE visit  - *** - PLAN: ***  3.  OTHER ISSUES (not addressed during today's phone visit) - *** - PLAN: ***  4.  Other history - He is retired Cabin crew  officer, and now works as a Product/process development scientist.  Non-smoker. - No family history of DVT/PE. - Maternal grandmother had stomach cancer.  PLAN SUMMARY: >> *** >> *** >> ***    I discussed the assessment and treatment plan with the patient. The patient was provided an opportunity to ask questions and all were answered. The patient  agreed with the plan and demonstrated an understanding of the instructions.   The patient was advised to call back or seek an in-person evaluation if the symptoms worsen or if the condition fails to improve as anticipated.  I provided *** minutes of non-face-to-face time during this encounter.   Carnella Guadalajara, PA-C ***

## 2023-09-26 ENCOUNTER — Encounter: Payer: Self-pay | Admitting: Physician Assistant

## 2023-09-26 ENCOUNTER — Inpatient Hospital Stay: Payer: BC Managed Care – PPO | Attending: Hematology | Admitting: Physician Assistant

## 2023-09-26 DIAGNOSIS — Z91199 Patient's noncompliance with other medical treatment and regimen due to unspecified reason: Secondary | ICD-10-CM

## 2023-10-02 ENCOUNTER — Ambulatory Visit (INDEPENDENT_AMBULATORY_CARE_PROVIDER_SITE_OTHER): Payer: BC Managed Care – PPO | Admitting: Gastroenterology

## 2023-10-02 ENCOUNTER — Encounter (INDEPENDENT_AMBULATORY_CARE_PROVIDER_SITE_OTHER): Payer: Self-pay | Admitting: Gastroenterology

## 2023-10-02 VITALS — BP 126/71 | HR 97 | Temp 98.1°F | Ht 73.0 in | Wt 249.8 lb

## 2023-10-02 DIAGNOSIS — Z7901 Long term (current) use of anticoagulants: Secondary | ICD-10-CM | POA: Diagnosis not present

## 2023-10-02 DIAGNOSIS — Z86718 Personal history of other venous thrombosis and embolism: Secondary | ICD-10-CM

## 2023-10-02 DIAGNOSIS — K589 Irritable bowel syndrome without diarrhea: Secondary | ICD-10-CM | POA: Diagnosis not present

## 2023-10-02 DIAGNOSIS — I8510 Secondary esophageal varices without bleeding: Secondary | ICD-10-CM | POA: Diagnosis not present

## 2023-10-02 DIAGNOSIS — I81 Portal vein thrombosis: Secondary | ICD-10-CM | POA: Diagnosis not present

## 2023-10-02 DIAGNOSIS — K580 Irritable bowel syndrome with diarrhea: Secondary | ICD-10-CM

## 2023-10-02 NOTE — Progress Notes (Unsigned)
Referring Provider: Mechele Claude, MD Primary Care Physician:  Mechele Claude, MD Primary GI Physician: Dr. Levon Hedger   Chief Complaint  Patient presents with   Irritable Bowel Syndrome    Follow up on IBS. States diarrhea is worse. Has most of the time after eating. Cramping. Dark stools. Has hemorrhoids that bleed. Takes pepto as needed but not often.    HPI:   James [name] is a 57 y.o. 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 diff   Patient presenting today for follow up of IBS  Last seen march 2024, at that time having some abdominal discomfort, constipation and diarrhea. Stools watery at times. Taking stool softener for constipation. On daily iron pills. Taking daily probiotic. Could not follow Low FODMAP diet well. Doing well on coreg 3.125mg  BID, did not tolerate higher dosage.   Recommended to continue with coreg, continue metamucil, avoid trigge foods, increase water and fiber in diet  Present:  Continues to have lower abdominal pain, sometimes worse with certain foods, and if he eats too much. Having a BM improves pain. Denies BRBPR. Has dark stools on po iron pills. Has gained some weight. Appetite is good. He has some nausea on occasion but no vomiting. Still having a mix of constipation and diarrhea. He takes metamucil 4-5 times per week if he feels constipation. He has about 5-6 stools per day that usually start out as solid but transition to diarrhea that can sometimes be explosive in nature. He is not taking his probiotic anymore.    CT A/P with contrast: Oct 2022 interval development of cavernous transformation of the main portal vein secondary to thrombosis noted on prior CT scan of June 13, 2021 -Embolization coils noted within  right hepatic lobe which was not identified on prior exam. -Large amount of stool seen in the sigmoid colon and rectum concerning for rectal impaction. -Sigmoid diverticulosis is noted without definite evidence of acute inflammation. -Mild broad necked fat containing periumbilical hernia is  abdominal wall construction for incisional hernia with mesh placement, Dr. Michaell Cowing on 11/23/2021.   Last Colonoscopy:11/22/21 6 polyps in the cecum between 1 to 2 mm in size, 4 polyps in the sigmoid, ascending colon and cecum.  2 to 5 mm in size.  Diverticulosis.  Small internal hemorrhoids.  Pathology was consistent with tubular adenomas except for sigmoid which was hyperplastic Last Endoscopy:01/2023  - Grade I esophageal varices.                           - 1 cm hiatal hernia.                           - Portal hypertensive gastropathy.                           - Normal examined duodenum.                           - No specimens collected.   Recommendations:  Repeat colonoscopy dec 2024 Repeat EGD feb 2026   Past Medical History:  Diagnosis Date   Arthritis    Hx of exploratory laparotomy    enterectomy small intest. resection,celiotomy  Hyperlipidemia    Hypertension    Hypothyroidism    Pre-diabetes    Sleep apnea     Past Surgical History:  Procedure Laterality Date   BACK SURGERY     BIOPSY  11/22/2021   Procedure: BIOPSY;  Surgeon: Dolores Frame, MD;  Location: [facility];  Service: Gastroenterology;;   COLONOSCOPY WITH PROPOFOL N/A 11/22/2021   Procedure: COLONOSCOPY WITH PROPOFOL;  Surgeon: Dolores Frame, MD;  Location: [facility];  Service: Gastroenterology;  Laterality: N/A;  8:45   ESOPHAGOGASTRODUODENOSCOPY (EGD) WITH PROPOFOL N/A 11/22/2021   Procedure: ESOPHAGOGASTRODUODENOSCOPY (EGD) WITH PROPOFOL;  Surgeon: Dolores Frame, MD;  [facility]: [facility];  Service: Gastroenterology;  Laterality: N/A;   ESOPHAGOGASTRODUODENOSCOPY (EGD) WITH  PROPOFOL N/A 02/06/2023   Procedure: ESOPHAGOGASTRODUODENOSCOPY (EGD) WITH PROPOFOL;  Surgeon: Dolores Frame, MD;  [facility]: [facility];  Service: Gastroenterology;  Laterality: N/A;  2:15 pm   INCISIONAL HERNIA REPAIR N/A 11/23/2021   Procedure: OPEN COMPONENT SEPARATION AND MESH ABDOMINAL WALL RECONSTRUCTION FOR INCISIONAL HERNIA REPAIR, LYSIS OF ADHESIONS AND TRANSABDOMINAL PLANE BLOCK;  Surgeon: Karie Soda, MD;  Location: [facility];  Service: General;  Laterality: N/A;   POLYPECTOMY  11/22/2021   Procedure: POLYPECTOMY;  Surgeon: Dolores Frame, MD;  Location: [facility];  Service: Gastroenterology;;   TONSILLECTOMY      Current Outpatient Medications  Medication Sig Dispense Refill   acidophilus (RISAQUAD) CAPS capsule Take 1 capsule by mouth daily.     aspirin EC 81 MG tablet Take 81 mg by mouth daily. Swallow whole.     atorvastatin (LIPITOR) 40 MG tablet TAKE ONE (1) TABLET EACH DAY 90 tablet 3   blood glucose meter kit and supplies Dispense based on patient and insurance preference. Use up to four times daily as directed. (FOR ICD-10 E10.9, E11.9). 1 each 0   carvedilol (COREG) 3.125 MG tablet TAKE 1 TABLET BY MOUTH TWICE DAILY WITH MEALS 60 tablet 1   Copper Gluconate (COPPER CAPS) 2 MG CAPS Take 8 mg by mouth daily for 7 days, THEN 6 mg daily for 7 days, THEN 4 mg daily for 7 days, THEN 2 mg daily. 180 capsule 1   fenofibrate 160 MG tablet TAKE ONE (1) TABLET EACH DAY 90 tablet 1   fluticasone (FLONASE) 50 MCG/ACT nasal spray USE 1 SPRAY IN BOTH NOSTRILS TWICE DAILYAS NEEDED FOR ALLERGIES OR RHINITIS 16 g 0   levothyroxine (SYNTHROID) 50 MCG tablet TAKE ONE (1) TABLET EACH DAY 90 tablet 3   rivaroxaban (XARELTO) 10 MG TABS tablet Take 1 tablet (10 mg total) by mouth daily. 90 tablet 3   sildenafil (REVATIO) 20 MG tablet Take 2-5 pills at once, orally, with each sexual encounter 50 tablet 5   testosterone cypionate (DEPOTESTOSTERONE CYPIONATE) 200 MG/ML  injection INJECT 0.5ML IM EVERY 7 DAYS 10 mL 1   valsartan-hydrochlorothiazide (DIOVAN-HCT) 320-25 MG tablet TAKE ONE TABLET BY MOUTH EACH DAY FOR BLOOD PRESSURE 90 tablet 1   No current facility-administered medications for this visit.    Allergies as of 10/02/2023 - Review Complete 10/02/2023  Allergen Reaction Noted   Metformin and related  05/04/2021    Family History  Problem Relation Age of Onset   COPD Mother    Alzheimer's disease Father    Stomach cancer Maternal Grandmother     Social History   Socioeconomic History   Marital status: Married    Spouse name: Not on file   Number of children: Not on file  Years of education: Not on file   Highest education level: Not on file  Occupational History   Not on file  Tobacco Use   Smoking status: Never    Passive exposure: Never   Smokeless tobacco: Never  Vaping Use   Vaping status: Never Used  Substance and Sexual Activity   Alcohol use: No   Drug use: No   Sexual activity: Yes    Birth control/protection: None  Other Topics Concern   Not on file  Social History Narrative   He is currently a Lawyer.    Social Determinants of Health   Financial Resource Strain: Not on file  Food Insecurity: No Food Insecurity (02/02/2023)   Hunger Vital Sign    Worried About Running Out of Food in the Last Year: Never true    Ran Out of Food in the Last Year: Never true  Transportation Needs: No Transportation Needs (02/02/2023)   PRAPARE - Administrator, Civil Service (Medical): No    Lack of Transportation (Non-Medical): No  Physical Activity: Not on file  Stress: Not on file  Social Connections: Not on file    Review of systems General: negative for malaise, night sweats, fever, chills, weight los Neck: Negative for lumps, goiter, pain and significant neck swelling Resp: Negative for cough, wheezing, dyspnea at rest CV: Negative for chest pain, leg swelling, palpitations, orthopnea GI: denies  melena, hematochezia, nausea, vomiting, diarrhea, constipation, dysphagia, odyonophagia, early satiety or unintentional weight loss.  MSK: Negative for joint pain or swelling, back pain, and muscle pain. Derm: Negative for itching or rash Psych: Denies depression, anxiety, memory loss, confusion. No homicidal or suicidal ideation.  Heme: Negative for prolonged bleeding, bruising easily, and swollen nodes. Endocrine: Negative for cold or heat intolerance, polyuria, polydipsia and goiter. Neuro: negative for tremor, gait imbalance, syncope and seizures. The remainder of the review of systems is noncontributory.  Physical Exam: BP 126/71 (BP Location: Left Arm, Patient Position: Sitting, Cuff Size: Large)   Pulse 97   Temp 98.1 F (36.7 C) (Oral)   Ht 6\' 1"  (1.854 m)   Wt 249 lb 12.8 oz (113.3 kg)   BMI 32.96 kg/m  General:   Alert and oriented. No distress noted. Pleasant and cooperative.  Head:  Normocephalic and atraumatic. Eyes:  Conjuctiva clear without scleral icterus. Mouth:  Oral mucosa pink and moist. Good dentition. No lesions. Heart: Normal rate and rhythm, s1 and s2 heart sounds present.  Lungs: Clear lung sounds in all lobes. Respirations equal and unlabored. Abdomen:  +BS, soft, non-tender and non-distended. No rebound or guarding. No HSM or masses noted. Derm: No palmar erythema or jaundice Msk:  Symmetrical without gross deformities. Normal posture. Extremities:  Without edema. Neurologic:  Alert and  oriented x4 Psych:  Alert and cooperative. Normal mood and affect.  Invalid input(s): "6 MONTHS"   ASSESSMENT: James [name] is a 57 y.o. male presenting today    PLAN:  Increase metamucil to 1T BID  2. Increase water intake  3. Resume probiotic 4. Bentyl 10mg  BID PRN 5.  Schedule colonoscopy   All questions were answered, patient verbalized understanding and is in agreement with plan as outlined above.    Follow Up: ***  [place] Frieden L. Jeanmarie Hubert, MSN, APRN,  AGNP-C Adult-Gerontology Nurse Practitioner [facility]

## 2023-10-02 NOTE — Patient Instructions (Signed)
Would recommend increasing metamucil to twice daily or you can try benefiber 1T 1-2 times per day Increase water intake, aim for atleast 64 oz per day Increase fruits, veggies and whole grains, kiwi and prunes are especially good for constipation You can restart daily probiotic, aim for one with atleast 2-3 strains of bacteria I have sent bentyl 10mg  to take up to twice daily for abdominal pain Try to avoid trigger foods and follow low FODMAP guide as you are able to We will schedule you for repeat colonoscopy in December  Continue with coreg 3.125mg  twice daily   Follow up 6 months  It was a pleasure to see you today. I want to create trusting relationships with patients and provide genuine, compassionate, and quality care. I truly value your feedback! please be on the lookout for a survey regarding your visit with me today. I appreciate your input about our visit and your time in completing this!    Ermal Brzozowski L. Jeanmarie Hubert, MSN, APRN, AGNP-C Adult-Gerontology Nurse Practitioner [facility] at [facility]

## 2023-10-08 ENCOUNTER — Encounter (INDEPENDENT_AMBULATORY_CARE_PROVIDER_SITE_OTHER): Payer: Self-pay | Admitting: *Deleted

## 2023-10-16 ENCOUNTER — Other Ambulatory Visit: Payer: Self-pay | Admitting: Family Medicine

## 2023-10-16 DIAGNOSIS — X32XXXD Exposure to sunlight, subsequent encounter: Secondary | ICD-10-CM | POA: Diagnosis not present

## 2023-10-16 DIAGNOSIS — C44319 Basal cell carcinoma of skin of other parts of face: Secondary | ICD-10-CM | POA: Diagnosis not present

## 2023-10-16 DIAGNOSIS — L570 Actinic keratosis: Secondary | ICD-10-CM | POA: Diagnosis not present

## 2023-10-23 ENCOUNTER — Other Ambulatory Visit: Payer: Self-pay | Admitting: Family Medicine

## 2023-10-23 ENCOUNTER — Other Ambulatory Visit (INDEPENDENT_AMBULATORY_CARE_PROVIDER_SITE_OTHER): Payer: Self-pay | Admitting: Gastroenterology

## 2023-10-23 DIAGNOSIS — I8500 Esophageal varices without bleeding: Secondary | ICD-10-CM

## 2023-10-23 DIAGNOSIS — I8510 Secondary esophageal varices without bleeding: Secondary | ICD-10-CM

## 2023-10-23 DIAGNOSIS — I81 Portal vein thrombosis: Secondary | ICD-10-CM

## 2023-10-23 DIAGNOSIS — K580 Irritable bowel syndrome with diarrhea: Secondary | ICD-10-CM

## 2023-10-23 DIAGNOSIS — E1159 Type 2 diabetes mellitus with other circulatory complications: Secondary | ICD-10-CM

## 2023-11-02 ENCOUNTER — Other Ambulatory Visit: Payer: Self-pay | Admitting: Family Medicine

## 2023-11-02 DIAGNOSIS — E1169 Type 2 diabetes mellitus with other specified complication: Secondary | ICD-10-CM

## 2023-11-03 DIAGNOSIS — R051 Acute cough: Secondary | ICD-10-CM | POA: Diagnosis not present

## 2023-11-03 DIAGNOSIS — Z6831 Body mass index (BMI) 31.0-31.9, adult: Secondary | ICD-10-CM | POA: Diagnosis not present

## 2023-11-03 DIAGNOSIS — J0100 Acute maxillary sinusitis, unspecified: Secondary | ICD-10-CM | POA: Diagnosis not present

## 2023-11-05 ENCOUNTER — Telehealth (INDEPENDENT_AMBULATORY_CARE_PROVIDER_SITE_OTHER): Payer: Self-pay | Admitting: Gastroenterology

## 2023-11-05 DIAGNOSIS — Z860100 Personal history of colon polyps, unspecified: Secondary | ICD-10-CM

## 2023-11-05 NOTE — Telephone Encounter (Signed)
Who is your primary care physician: Dr.Stacks  Reasons for the colonoscopy: recall  Have you had a colonoscopy before?  Yes 2 years ago  Do you have family history of colon cancer? no  Previous colonoscopy with polyps removed? yes  Do you have a history colorectal cancer?   no  Are you diabetic? If yes, Type 1 or Type 2?    Yes type 2  Do you have a prosthetic or mechanical heart valve? no  Do you have a pacemaker/defibrillator?   no  Have you had endocarditis/atrial fibrillation? no  Have you had joint replacement within the last 12 months?  no  Do you tend to be constipated or have to use laxatives? no  Do you have any history of drugs or alchohol?  no  Do you use supplemental oxygen?  no  Have you had a stroke or heart attack within the last 6 months? no  Do you take weight loss medication?  no  Do you take any blood-thinning medications such as: (aspirin, warfarin, Plavix, Aggrenox)  yes  If yes we need the name, milligram, dosage and who is prescribing doctor Xarelto 10 mg Current Outpatient Medications on File Prior to Visit  Medication Sig Dispense Refill   atorvastatin (LIPITOR) 40 MG tablet TAKE ONE (1) TABLET EACH DAY 90 tablet 3   blood glucose meter kit and supplies Dispense based on patient and insurance preference. Use up to four times daily as directed. (FOR ICD-10 E10.9, E11.9). 1 each 0   carvedilol (COREG) 3.125 MG tablet TAKE 1 TABLET BY MOUTH TWICE DAILY WITH MEALS 60 tablet 1   fenofibrate 160 MG tablet TAKE ONE (1) TABLET EACH DAY 90 tablet 0   fluticasone (FLONASE) 50 MCG/ACT nasal spray USE 1 SPRAY IN BOTH NOSTRILS TWICE DAILYAS NEEDED FOR ALLERGIES OR RHINITIS 16 g 5   levothyroxine (SYNTHROID) 50 MCG tablet TAKE ONE (1) TABLET EACH DAY 90 tablet 3   rivaroxaban (XARELTO) 10 MG TABS tablet Take 1 tablet (10 mg total) by mouth daily. 90 tablet 3   sildenafil (REVATIO) 20 MG tablet Take 2-5 pills at once, orally, with each sexual encounter 50 tablet 5    testosterone cypionate (DEPOTESTOSTERONE CYPIONATE) 200 MG/ML injection INJECT 0.5ML IM EVERY 7 DAYS 10 mL 1   valsartan-hydrochlorothiazide (DIOVAN-HCT) 320-25 MG tablet TAKE ONE TABLET BY MOUTH EACH DAY FOR BLOOD PRESSURE 90 tablet 0   acidophilus (RISAQUAD) CAPS capsule Take 1 capsule by mouth daily. (Patient not taking: Reported on 11/05/2023)     aspirin EC 81 MG tablet Take 81 mg by mouth daily. Swallow whole. (Patient not taking: Reported on 11/05/2023)     No current facility-administered medications on file prior to visit.    Allergies  Allergen Reactions   Metformin And Related     Loose bloody stools     Pharmacy: [facility] Name: Latina Craver number where you can be reached: [phone]

## 2023-11-06 NOTE — Telephone Encounter (Signed)
Room 1 Thanks

## 2023-11-07 NOTE — Telephone Encounter (Signed)
Will call with January schedule

## 2023-11-23 NOTE — Telephone Encounter (Signed)
Pt left message inquiring about colonoscopy. Returned call to pt but had to leave message to return call.

## 2023-11-26 ENCOUNTER — Other Ambulatory Visit: Payer: Self-pay | Admitting: *Deleted

## 2023-11-26 ENCOUNTER — Telehealth (INDEPENDENT_AMBULATORY_CARE_PROVIDER_SITE_OTHER): Payer: Self-pay | Admitting: Gastroenterology

## 2023-11-26 DIAGNOSIS — D696 Thrombocytopenia, unspecified: Secondary | ICD-10-CM

## 2023-11-26 DIAGNOSIS — D500 Iron deficiency anemia secondary to blood loss (chronic): Secondary | ICD-10-CM

## 2023-11-26 DIAGNOSIS — E610 Copper deficiency: Secondary | ICD-10-CM

## 2023-11-26 MED ORDER — PEG 3350-KCL-NA BICARB-NACL 420 G PO SOLR: 4000.0000 mL | Freq: Once | ORAL | 0 refills | Status: AC

## 2023-11-26 NOTE — Telephone Encounter (Signed)
Pt called in to schedule TCS. Pt scheduled for 12/25/23 at 8:15am. Will send instructions once pre op has been received. Prep sent to pharmacy. No pa needed per insurance.

## 2023-11-26 NOTE — Telephone Encounter (Signed)
Patient James [name] is on chronic prophylactic anticoagulation (Xarelto 10 mg daily) due to his history of splanchnic vein thrombosis.  From a hematology standpoint, he may HOLD Xarelto x 48 hours prior to his upcoming colonoscopy. He should resume Xarelto within the 24 hours following his colonoscopy.  Although this does slightly increase his risk of VTE within this period, the benefits of holding anticoagulation prior to procedure outweigh the risks.  Carnella Guadalajara, PA-C 11/26/23 12:18 PM

## 2023-11-26 NOTE — Telephone Encounter (Signed)
    11/26/23  James [name] [birth year]  What type of surgery is being performed? Colonoscopy   When is surgery scheduled? 12/25/23  Clearance to hold Xarelto for 2 days   Name of physician performing surgery?  Dr. Katrinka Blazing [facility] Gastroenterology at [facility] Phone: [phone] Fax: [phone]  Anethesia type (none, local, MAC, general)? MAC

## 2023-11-26 NOTE — Addendum Note (Signed)
Addended by: Marlowe Shores on: 11/26/2023 10:41 AM   Modules accepted: Orders

## 2023-11-26 NOTE — Telephone Encounter (Signed)
Please advise 

## 2023-11-27 ENCOUNTER — Other Ambulatory Visit: Payer: BC Managed Care – PPO

## 2023-11-27 ENCOUNTER — Telehealth: Payer: BC Managed Care – PPO | Admitting: Physician Assistant

## 2023-12-09 ENCOUNTER — Emergency Department (HOSPITAL_COMMUNITY): Payer: BC Managed Care – PPO

## 2023-12-09 ENCOUNTER — Encounter (HOSPITAL_COMMUNITY): Payer: Self-pay

## 2023-12-09 ENCOUNTER — Other Ambulatory Visit: Payer: Self-pay

## 2023-12-09 ENCOUNTER — Emergency Department (HOSPITAL_COMMUNITY)
Admission: EM | Admit: 2023-12-09 | Discharge: 2023-12-09 | Disposition: A | Discharge status: Home / Self Care | Payer: BC Managed Care – PPO | Attending: Emergency Medicine | Admitting: Emergency Medicine

## 2023-12-09 DIAGNOSIS — R739 Hyperglycemia, unspecified: Secondary | ICD-10-CM

## 2023-12-09 DIAGNOSIS — R7401 Elevation of levels of liver transaminase levels: Secondary | ICD-10-CM | POA: Insufficient documentation

## 2023-12-09 DIAGNOSIS — R1012 Left upper quadrant pain: Secondary | ICD-10-CM | POA: Diagnosis not present

## 2023-12-09 DIAGNOSIS — Z79899 Other long term (current) drug therapy: Secondary | ICD-10-CM | POA: Insufficient documentation

## 2023-12-09 DIAGNOSIS — R17 Unspecified jaundice: Secondary | ICD-10-CM

## 2023-12-09 DIAGNOSIS — Z7901 Long term (current) use of anticoagulants: Secondary | ICD-10-CM | POA: Insufficient documentation

## 2023-12-09 DIAGNOSIS — E119 Type 2 diabetes mellitus without complications: Secondary | ICD-10-CM | POA: Insufficient documentation

## 2023-12-09 DIAGNOSIS — K5732 Diverticulitis of large intestine without perforation or abscess without bleeding: Secondary | ICD-10-CM | POA: Diagnosis not present

## 2023-12-09 DIAGNOSIS — N289 Disorder of kidney and ureter, unspecified: Secondary | ICD-10-CM | POA: Insufficient documentation

## 2023-12-09 DIAGNOSIS — I700 Atherosclerosis of aorta: Secondary | ICD-10-CM | POA: Diagnosis not present

## 2023-12-09 DIAGNOSIS — N179 Acute kidney failure, unspecified: Secondary | ICD-10-CM | POA: Diagnosis not present

## 2023-12-09 DIAGNOSIS — R7309 Other abnormal glucose: Secondary | ICD-10-CM | POA: Insufficient documentation

## 2023-12-09 DIAGNOSIS — Z7982 Long term (current) use of aspirin: Secondary | ICD-10-CM | POA: Insufficient documentation

## 2023-12-09 DIAGNOSIS — R109 Unspecified abdominal pain: Secondary | ICD-10-CM | POA: Diagnosis not present

## 2023-12-09 DIAGNOSIS — I10 Essential (primary) hypertension: Secondary | ICD-10-CM | POA: Insufficient documentation

## 2023-12-09 DIAGNOSIS — D696 Thrombocytopenia, unspecified: Secondary | ICD-10-CM | POA: Diagnosis not present

## 2023-12-09 DIAGNOSIS — R161 Splenomegaly, not elsewhere classified: Secondary | ICD-10-CM | POA: Diagnosis not present

## 2023-12-09 LAB — COMPREHENSIVE METABOLIC PANEL
ALT: 58 U/L — ABNORMAL HIGH (ref 0–44)
AST: 45 U/L — ABNORMAL HIGH (ref 15–41)
Albumin: 4 g/dL (ref 3.5–5.0)
Alkaline Phosphatase: 38 U/L (ref 38–126)
Anion gap: 11 (ref 5–15)
BUN: 19 mg/dL (ref 6–20)
CO2: 22 mmol/L (ref 22–32)
Calcium: 9.6 mg/dL (ref 8.9–10.3)
Chloride: 104 mmol/L (ref 98–111)
Creatinine, Ser: 1.35 mg/dL — ABNORMAL HIGH (ref 0.61–1.24)
GFR, Estimated: >60 mL/min (ref >60)
Glucose, Bld: 111 mg/dL — ABNORMAL HIGH (ref 70–99)
Potassium: 3.7 mmol/L (ref 3.5–5.1)
Sodium: 137 mmol/L (ref 135–145)
Total Bilirubin: 2 mg/dL — ABNORMAL HIGH (ref <1.2)
Total Protein: 7 g/dL (ref 6.5–8.1)

## 2023-12-09 LAB — CBC
HCT: 48.8 % (ref 39.0–52.0)
Hemoglobin: 16.6 g/dL (ref 13.0–17.0)
MCH: 32.4 pg (ref 26.0–34.0)
MCHC: 34 g/dL (ref 30.0–36.0)
MCV: 95.3 fL (ref 80.0–100.0)
Platelets: 109 10*3/uL — ABNORMAL LOW (ref 150–400)
RBC: 5.12 MIL/uL (ref 4.22–5.81)
RDW: 13.8 % (ref 11.5–15.5)
WBC: 8.9 10*3/uL (ref 4.0–10.5)
nRBC: 0 % (ref 0.0–0.2)

## 2023-12-09 LAB — URINALYSIS, ROUTINE W REFLEX MICROSCOPIC
Bacteria, UA: NONE SEEN
Bilirubin Urine: NEGATIVE
Glucose, UA: NEGATIVE mg/dL
Hgb urine dipstick: NEGATIVE
Ketones, ur: 5 mg/dL — AB
Leukocytes,Ua: NEGATIVE
Nitrite: NEGATIVE
Protein, ur: 30 mg/dL — AB
Specific Gravity, Urine: 1.032 — ABNORMAL HIGH (ref 1.005–1.030)
pH: 5 (ref 5.0–8.0)

## 2023-12-09 LAB — LACTIC ACID, PLASMA: Lactic Acid, Venous: 1.1 mmol/L (ref 0.5–1.9)

## 2023-12-09 LAB — LIPASE, BLOOD: Lipase: 50 U/L (ref 11–51)

## 2023-12-09 MED ORDER — IOHEXOL 350 MG/ML SOLN
100.0000 mL | Freq: Once | INTRAVENOUS | Status: AC | PRN
  Administered 2023-12-09: 100 mL via INTRAVENOUS

## 2023-12-09 MED ORDER — METRONIDAZOLE 500 MG PO TABS: 500.0000 mg | ORAL_TABLET | Freq: Three times a day (TID) | ORAL | 0 refills | Status: DC

## 2023-12-09 MED ORDER — CIPROFLOXACIN HCL 250 MG PO TABS
500.0000 mg | ORAL_TABLET | Freq: Once | ORAL | Status: AC
  Administered 2023-12-09: 500 mg via ORAL
  Filled 2023-12-09: qty 2

## 2023-12-09 MED ORDER — CIPROFLOXACIN HCL 500 MG PO TABS: 500.0000 mg | ORAL_TABLET | Freq: Two times a day (BID) | ORAL | 0 refills | Status: DC

## 2023-12-09 MED ORDER — METRONIDAZOLE 500 MG PO TABS
500.0000 mg | ORAL_TABLET | Freq: Once | ORAL | Status: AC
  Administered 2023-12-09: 500 mg via ORAL
  Filled 2023-12-09: qty 1

## 2023-12-09 NOTE — ED Triage Notes (Signed)
Pt to ED from home with c/o left flank pain that started Thursday, loose bowel movements x 2, pt says he has had hx of mesenteric thrombosis 2022 and this type pain is similar. Pt also has hx of diverticulitis. Pt is on Xarelto

## 2023-12-09 NOTE — Discharge Instructions (Addendum)
Return to the emergency department if pain is getting worse or if you start running high fever or start vomiting.

## 2023-12-09 NOTE — ED Notes (Signed)
Patient transported to CT 

## 2023-12-09 NOTE — ED Provider Notes (Signed)
[place] EMERGENCY DEPARTMENT AT [facility] Provider Note   CSN: [id number] Arrival date & time: 12/09/23  0016     History  Chief Complaint  Patient presents with   Flank Pain    James [name] is a 57 y.o. male.  The history is provided by the patient.  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  On that occasion, he had several CT scans where it was diagnosed as diverticulitis.  He has had some nausea but no vomiting.  Denies fever or chills.  He denies constipation or diarrhea.   Home Medications Prior to Admission medications   Medication Sig Start Date End Date Taking? Authorizing Provider  atorvastatin (LIPITOR) 40 MG tablet TAKE ONE (1) TABLET EACH DAY 08/28/23  Yes Stacks, Broadus John, MD  carvedilol (COREG) 3.125 MG tablet TAKE 1 TABLET BY MOUTH TWICE DAILY WITH MEALS 10/23/23  Yes Carlan, Chelsea L, NP  fenofibrate 160 MG tablet TAKE ONE (1) TABLET EACH DAY 11/02/23  Yes Stacks, Broadus John, MD  levothyroxine (SYNTHROID) 50 MCG tablet TAKE ONE (1) TABLET EACH DAY 08/28/23  Yes Mechele Claude, MD  rivaroxaban (XARELTO) 10 MG TABS tablet Take 1 tablet (10 mg total) by mouth daily. 06/20/23  Yes Doreatha Massed, MD  valsartan-hydrochlorothiazide (DIOVAN-HCT) 320-25 MG tablet TAKE ONE TABLET BY MOUTH EACH DAY FOR BLOOD PRESSURE 11/02/23  Yes Mechele Claude, MD  acidophilus (RISAQUAD) CAPS capsule Take 1 capsule by mouth daily. Patient not taking: Reported on 11/05/2023    [provider]  aspirin EC 81 MG tablet Take 81 mg by mouth daily. Swallow whole. Patient not taking: Reported on 11/05/2023    [provider]  blood glucose meter kit and supplies Dispense based on patient and insurance preference. Use up to four times daily as directed.  (FOR ICD-10 E10.9, E11.9). 10/17/19   Sonny Masters, FNP  fluticasone (FLONASE) 50 MCG/ACT nasal spray USE 1 SPRAY IN BOTH NOSTRILS TWICE DAILYAS NEEDED FOR ALLERGIES OR RHINITIS 10/16/23   Gabriel Earing, FNP  sildenafil (REVATIO) 20 MG tablet Take 2-5 pills at once, orally, with each sexual encounter 03/02/22   Mechele Claude, MD  testosterone cypionate (DEPOTESTOSTERONE CYPIONATE) 200 MG/ML injection INJECT 0.5ML IM EVERY 7 DAYS 10/23/23   Mechele Claude, MD      Allergies    Metformin and related    Review of Systems   Review of Systems  Genitourinary:  Positive for flank pain.  All other systems reviewed and are negative.   Physical Exam Updated Vital Signs BP (!) 148/70   Pulse 85   Temp 98.3 F (36.8 C) (Oral)   Resp 17   Ht 6\' 1"  (1.854 m)   Wt 114 kg   SpO2 95%   BMI 33.16 kg/m  Physical Exam Vitals and nursing note reviewed.   57 year old male, resting comfortably and in no acute distress. Vital signs are significant for mildly elevated blood pressure. Oxygen saturation is 95%, which is normal. Head is normocephalic and atraumatic. PERRLA, EOMI. Oropharynx is clear. Neck is nontender and supple without adenopathy. Back is nontender and there is no CVA tenderness. Lungs are clear without rales, wheezes, or rhonchi. Chest is nontender. Heart has regular rate and rhythm without murmur. Abdomen is soft, flat, with mild left lower quadrant tenderness.  There is no rebound or guarding. Extremities have no cyanosis or edema, full range of motion is present. Skin is warm and dry without rash. Neurologic: Mental status is normal, moves all extremities equally.  ED Results / Procedures / Treatments   Labs (all labs ordered are listed, but only abnormal results are displayed) Labs Reviewed  COMPREHENSIVE METABOLIC PANEL - Abnormal; Notable for the following components:      Result Value   Glucose, Bld 111 (*)    Creatinine, Ser 1.35 (*)    AST 45 (*)    ALT 58 (*)     Total Bilirubin 2.0 (*)    All other components within normal limits  CBC - Abnormal; Notable for the following components:   Platelets 109 (*)    All other components within normal limits  LIPASE, BLOOD  URINALYSIS, ROUTINE W REFLEX MICROSCOPIC  LACTIC ACID, PLASMA   Radiology CT Angio Abd/Pel w/ and/or w/o Result Date: 12/09/2023 CLINICAL DATA:  Left upper quadrant pain, left flank pain. EXAM: CTA ABDOMEN AND PELVIS WITHOUT AND WITH CONTRAST TECHNIQUE: Multidetector CT imaging of the abdomen and pelvis was performed using the standard protocol during bolus administration of intravenous contrast. Multiplanar reconstructed images and MIPs were obtained and reviewed to evaluate the vascular anatomy. RADIATION DOSE REDUCTION: This exam was performed according to the departmental dose-optimization program which includes automated exposure control, adjustment of the mA and/or kV according to patient size and/or use of iterative reconstruction technique. CONTRAST:  OMNIPAQUE IOHEXOL 350 MG/ML SOLN COMPARISON:  10/07/2021 FINDINGS: VASCULAR Aorta: Normal caliber aorta without aneurysm, dissection, vasculitis or significant stenosis. Scattered aortic calcifications. Celiac: Patent without evidence of aneurysm, dissection, vasculitis or significant stenosis. SMA: Patent without evidence of aneurysm, dissection, vasculitis or significant stenosis. Renals: Both renal arteries are patent without evidence of aneurysm, dissection, vasculitis, fibromuscular dysplasia or significant stenosis. IMA: Patent without evidence of aneurysm, dissection, vasculitis or significant stenosis. Inflow: Patent without evidence of aneurysm, dissection, vasculitis or significant stenosis. Proximal Outflow: Bilateral common femoral and visualized portions of the superficial and profunda femoral arteries are patent without evidence of aneurysm, dissection, vasculitis or significant stenosis. Veins: No evidence of venous  thrombosis. Cavernous transformation of the portal vein is stable since prior study. Review of the MIP images confirms the above findings. NON-VASCULAR Lower chest: Coronary arch crash that calcifications in the visualized coronary arteries. No acute abnormality. Hepatobiliary: Portal vein occlusion with cavernous transformation of the portal vein again noted as seen on prior study. No focal hepatic scar stud no suspicious focal hepatic abnormality. Embolization coils in the right hepatic lobe, stable. Gallbladder unremarkable. Pancreas: No focal abnormality or ductal dilatation. Spleen: No focal abnormality. Splenomegaly with a craniocaudal length of 17.6 cm. This is stable since prior study. Adrenals/Urinary Tract: No adrenal abnormality. No focal renal abnormality. No stones or hydronephrosis. Urinary bladder is unremarkable. Stomach/Bowel: Left colonic diverticulosis. Slight inflammation adjacent to the proximal descending colon near the splenic flexure could reflect active diverticulitis. No bowel obstruction. Lymphatic: No adenopathy Reproductive: No visible focal abnormality. Other: Trace free fluid in the cul-de-sac.  No free air. Musculoskeletal: No acute bony abnormality. IMPRESSION: VASCULAR Chronic portal vein occlusion with cavernous transformation of the portal vein as seen on prior study from 2022. Otherwise no evidence of arterial or venous occlusion/thrombosis. Scattered aortoiliac atherosclerosis. NON-VASCULAR Left colonic diverticulosis. Haziness around the proximal descending colon may reflect early active diverticulitis. Splenomegaly, stable since prior study. Electronically Signed   By: Charlett Nose M.D.   On:  12/09/2023 03:04    Procedures Procedures    Medications Ordered in ED Medications  ciprofloxacin (CIPRO) tablet 500 mg (has no administration in time range)  metroNIDAZOLE (FLAGYL) tablet 500 mg (has no administration in time range)  iohexol (OMNIPAQUE) 350 MG/ML injection 100  mL (100 mLs Intravenous Contrast Given 12/09/23 0227)    ED Course/ Medical Decision Making/ A&P                                 Medical Decision Making Amount and/or Complexity of Data Reviewed Labs: ordered. Radiology: ordered.  Risk Prescription drug management.   Left lower quadrant abdominal pain in patient with history of prior mesenteric thrombosis but currently anticoagulated.  Likelihood of recurrent mesenteric thrombosis is very small but needs to be considered.  Also consider possibility of diverticulitis, urolithiasis, pyelonephritis.  I reviewed his laboratory test, my interpretation is stable renal insufficiency, borderline transaminase elevation is not significantly changed from baseline, elevated total bilirubin also not significantly changed from baseline, thrombocytopenia not changed from baseline.  I have ordered CT angiogram of the abdomen and pelvis to evaluate for possible recurrent mesenteric thrombosis.  I have reviewed his past records, and note hospitalization on 06/13/2021 for small bowel ischemia from mesenteric thrombosis.  Lactic acid level is normal.  CT scan shows evidence of early diverticulitis.  Have independently viewed the images, and agree with radiologist's interpretation.  I ordered an initial dose of ciprofloxacin and metronidazole and I am discharging him with prescriptions for ciprofloxacin and metronidazole.  Follow-up with primary care provider following completion of course of antibiotics.  Return precautions discussed.  Final Clinical Impression(s) / ED Diagnoses Final diagnoses:  Diverticulitis of descending colon  Elevated random blood glucose level  Renal insufficiency  Elevated transaminase level  Serum total bilirubin elevated  Thrombocytopenia (HCC)  Chronic anticoagulation    Rx / DC Orders ED Discharge Orders          Ordered    ciprofloxacin (CIPRO) 500 MG tablet  2 times daily        12/09/23 0337    metroNIDAZOLE (FLAGYL)  500 MG tablet  3 times daily        12/09/23 4332              Dione Booze, MD 12/09/23 [phone]

## 2023-12-18 ENCOUNTER — Encounter (HOSPITAL_COMMUNITY): Payer: Self-pay

## 2023-12-18 ENCOUNTER — Encounter (HOSPITAL_COMMUNITY)
Admission: RE | Admit: 2023-12-18 | Discharge: 2023-12-18 | Disposition: A | Discharge status: Home / Self Care | Payer: BC Managed Care – PPO | Source: Ambulatory Visit | Attending: Gastroenterology | Admitting: Gastroenterology

## 2023-12-18 HISTORY — DX: Diverticulosis of large intestine without perforation or abscess without bleeding: K57.30

## 2023-12-25 ENCOUNTER — Other Ambulatory Visit: Payer: Self-pay

## 2023-12-25 ENCOUNTER — Ambulatory Visit (HOSPITAL_COMMUNITY)
Admission: RE | Admit: 2023-12-25 | Discharge: 2023-12-25 | Disposition: A | Discharge status: Home / Self Care | Payer: BC Managed Care – PPO | Attending: Gastroenterology | Admitting: Gastroenterology

## 2023-12-25 ENCOUNTER — Ambulatory Visit (HOSPITAL_COMMUNITY): Payer: BC Managed Care – PPO | Admitting: Anesthesiology

## 2023-12-25 ENCOUNTER — Encounter (HOSPITAL_COMMUNITY): Payer: Self-pay | Admitting: Gastroenterology

## 2023-12-25 ENCOUNTER — Other Ambulatory Visit (INDEPENDENT_AMBULATORY_CARE_PROVIDER_SITE_OTHER): Payer: Self-pay | Admitting: Gastroenterology

## 2023-12-25 ENCOUNTER — Ambulatory Visit (HOSPITAL_COMMUNITY): Payer: Self-pay | Admitting: Anesthesiology

## 2023-12-25 ENCOUNTER — Encounter (HOSPITAL_COMMUNITY)
Admission: RE | Disposition: A | Discharge status: Home / Self Care | Payer: Self-pay | Source: Home / Self Care | Attending: Gastroenterology

## 2023-12-25 DIAGNOSIS — D120 Benign neoplasm of cecum: Secondary | ICD-10-CM

## 2023-12-25 DIAGNOSIS — Z09 Encounter for follow-up examination after completed treatment for conditions other than malignant neoplasm: Secondary | ICD-10-CM | POA: Insufficient documentation

## 2023-12-25 DIAGNOSIS — G4733 Obstructive sleep apnea (adult) (pediatric): Secondary | ICD-10-CM | POA: Diagnosis not present

## 2023-12-25 DIAGNOSIS — Z860100 Personal history of colon polyps, unspecified: Secondary | ICD-10-CM

## 2023-12-25 DIAGNOSIS — K5730 Diverticulosis of large intestine without perforation or abscess without bleeding: Secondary | ICD-10-CM | POA: Insufficient documentation

## 2023-12-25 DIAGNOSIS — I82891 Chronic embolism and thrombosis of other specified veins: Secondary | ICD-10-CM | POA: Diagnosis not present

## 2023-12-25 DIAGNOSIS — Z1211 Encounter for screening for malignant neoplasm of colon: Secondary | ICD-10-CM | POA: Diagnosis not present

## 2023-12-25 DIAGNOSIS — I10 Essential (primary) hypertension: Secondary | ICD-10-CM | POA: Insufficient documentation

## 2023-12-25 DIAGNOSIS — G4730 Sleep apnea, unspecified: Secondary | ICD-10-CM | POA: Diagnosis not present

## 2023-12-25 DIAGNOSIS — D123 Benign neoplasm of transverse colon: Secondary | ICD-10-CM | POA: Diagnosis not present

## 2023-12-25 DIAGNOSIS — E785 Hyperlipidemia, unspecified: Secondary | ICD-10-CM | POA: Insufficient documentation

## 2023-12-25 DIAGNOSIS — K6289 Other specified diseases of anus and rectum: Secondary | ICD-10-CM | POA: Diagnosis not present

## 2023-12-25 DIAGNOSIS — E119 Type 2 diabetes mellitus without complications: Secondary | ICD-10-CM | POA: Diagnosis not present

## 2023-12-25 DIAGNOSIS — I152 Hypertension secondary to endocrine disorders: Secondary | ICD-10-CM

## 2023-12-25 DIAGNOSIS — K635 Polyp of colon: Secondary | ICD-10-CM | POA: Diagnosis not present

## 2023-12-25 DIAGNOSIS — Z7901 Long term (current) use of anticoagulants: Secondary | ICD-10-CM | POA: Diagnosis not present

## 2023-12-25 DIAGNOSIS — I82C29 Chronic embolism and thrombosis of unspecified internal jugular vein: Secondary | ICD-10-CM | POA: Insufficient documentation

## 2023-12-25 DIAGNOSIS — D122 Benign neoplasm of ascending colon: Secondary | ICD-10-CM | POA: Insufficient documentation

## 2023-12-25 DIAGNOSIS — E039 Hypothyroidism, unspecified: Secondary | ICD-10-CM | POA: Diagnosis not present

## 2023-12-25 DIAGNOSIS — I81 Portal vein thrombosis: Secondary | ICD-10-CM

## 2023-12-25 DIAGNOSIS — K648 Other hemorrhoids: Secondary | ICD-10-CM | POA: Diagnosis not present

## 2023-12-25 DIAGNOSIS — K580 Irritable bowel syndrome with diarrhea: Secondary | ICD-10-CM

## 2023-12-25 DIAGNOSIS — I8500 Esophageal varices without bleeding: Secondary | ICD-10-CM

## 2023-12-25 DIAGNOSIS — I8510 Secondary esophageal varices without bleeding: Secondary | ICD-10-CM

## 2023-12-25 HISTORY — PX: POLYPECTOMY: SHX5525

## 2023-12-25 HISTORY — PX: COLONOSCOPY WITH PROPOFOL: SHX5780

## 2023-12-25 LAB — HM COLONOSCOPY

## 2023-12-25 LAB — GLUCOSE, CAPILLARY: Glucose-Capillary: 112 mg/dL — ABNORMAL HIGH (ref 70–99)

## 2023-12-25 SURGERY — COLONOSCOPY WITH PROPOFOL
Anesthesia: General

## 2023-12-25 MED ORDER — PHENYLEPHRINE 80 MCG/ML (10ML) SYRINGE FOR IV PUSH (FOR BLOOD PRESSURE SUPPORT)
PREFILLED_SYRINGE | INTRAVENOUS | Status: DC | PRN
  Administered 2023-12-25 (×2): 160 ug via INTRAVENOUS

## 2023-12-25 MED ORDER — GLYCOPYRROLATE PF 0.2 MG/ML IJ SOSY
PREFILLED_SYRINGE | INTRAMUSCULAR | Status: AC
  Filled 2023-12-25: qty 1

## 2023-12-25 MED ORDER — PROPOFOL 10 MG/ML IV BOLUS
INTRAVENOUS | Status: AC
  Filled 2023-12-25: qty 20

## 2023-12-25 MED ORDER — LACTATED RINGERS IV SOLN: INTRAVENOUS | Status: DC

## 2023-12-25 MED ORDER — STERILE WATER FOR IRRIGATION IR SOLN
Status: DC | PRN
  Administered 2023-12-25: 180 mL

## 2023-12-25 MED ORDER — LIDOCAINE HCL (PF) 2 % IJ SOLN
INTRAMUSCULAR | Status: AC
  Filled 2023-12-25: qty 5

## 2023-12-25 MED ORDER — LIDOCAINE HCL (PF) 2 % IJ SOLN
INTRAMUSCULAR | Status: DC | PRN
  Administered 2023-12-25: 100 mg via INTRADERMAL

## 2023-12-25 MED ORDER — PHENYLEPHRINE 80 MCG/ML (10ML) SYRINGE FOR IV PUSH (FOR BLOOD PRESSURE SUPPORT)
PREFILLED_SYRINGE | INTRAVENOUS | Status: AC
  Filled 2023-12-25: qty 10

## 2023-12-25 MED ORDER — PROPOFOL 10 MG/ML IV BOLUS
INTRAVENOUS | Status: DC | PRN
  Administered 2023-12-25: 80 mg via INTRAVENOUS

## 2023-12-25 MED ORDER — PROPOFOL 500 MG/50ML IV EMUL
INTRAVENOUS | Status: AC
  Filled 2023-12-25: qty 50

## 2023-12-25 MED ORDER — EPHEDRINE SULFATE-NACL 50-0.9 MG/10ML-% IV SOSY
PREFILLED_SYRINGE | INTRAVENOUS | Status: DC | PRN
  Administered 2023-12-25: 15 mg via INTRAVENOUS
  Administered 2023-12-25: 10 mg via INTRAVENOUS

## 2023-12-25 MED ORDER — PROPOFOL 500 MG/50ML IV EMUL
INTRAVENOUS | Status: DC | PRN
  Administered 2023-12-25: 200 ug/kg/min via INTRAVENOUS

## 2023-12-25 NOTE — Discharge Instructions (Signed)
You are being discharged to home.  ?Resume your previous diet.  ?We are waiting for your pathology results.  ?Your physician has recommended a repeat colonoscopy in three years for surveillance.  ?Restart Xarelto tomorrow. ?

## 2023-12-25 NOTE — Telephone Encounter (Signed)
Last seen 10/02/23.

## 2023-12-25 NOTE — H&P (Signed)
 James [name] is an 58 y.o. male.   Chief Complaint: History of colon polyps HPI: James [name] is a 58 y.o. 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 diff, coming for evaluation of history of colon polyps.  The patient denies having any nausea, vomiting, fever, chills, hematochezia, melena, hematemesis, abdominal distention, abdominal pain, diarrhea, jaundice, pruritus or weight loss.   Last Colonoscopy:11/22/21 6 polyps in the cecum between 1 to 2 mm in size, 4 polyps in the sigmoid, ascending colon and cecum.  2 to 5 mm in size.  Diverticulosis.  Small internal hemorrhoids.  Pathology was consistent with tubular adenomas except for sigmoid which was hyperplastic    Past Medical History:  Diagnosis Date   Arthritis    Diverticula of colon    Hx of exploratory laparotomy    enterectomy small intest. resection,celiotomy   Hyperlipidemia    Hypertension    Hypothyroidism    Pre-diabetes    Sleep apnea     Past Surgical History:  Procedure Laterality Date   BACK SURGERY     BIOPSY  11/22/2021   Procedure: BIOPSY;  Surgeon: Eartha Angelia Sieving, MD;  Location: [facility];  Service: Gastroenterology;;   COLONOSCOPY WITH PROPOFOL  N/A 11/22/2021   Procedure: COLONOSCOPY WITH PROPOFOL ;  Surgeon: Eartha Angelia Sieving, MD;  Location: [facility];  Service: Gastroenterology;  Laterality: N/A;  8:45   ESOPHAGOGASTRODUODENOSCOPY (EGD) WITH PROPOFOL  N/A 11/22/2021   Procedure: ESOPHAGOGASTRODUODENOSCOPY (EGD) WITH PROPOFOL ;  Surgeon: Eartha Angelia Sieving, MD;  Location: [facility];  Service: Gastroenterology;  Laterality: N/A;   ESOPHAGOGASTRODUODENOSCOPY (EGD) WITH PROPOFOL  N/A 02/06/2023   Procedure: ESOPHAGOGASTRODUODENOSCOPY (EGD)  WITH PROPOFOL ;  Surgeon: Eartha Angelia Sieving, MD;  Location: [facility];  Service: Gastroenterology;  Laterality: N/A;  2:15 pm   INCISIONAL HERNIA REPAIR N/A 11/23/2021   Procedure: OPEN COMPONENT SEPARATION AND MESH ABDOMINAL WALL RECONSTRUCTION FOR INCISIONAL HERNIA REPAIR, LYSIS OF ADHESIONS AND TRANSABDOMINAL PLANE BLOCK;  Surgeon: Sheldon Standing, MD;  Location: [facility];  Service: General;  Laterality: N/A;   POLYPECTOMY  11/22/2021   Procedure: POLYPECTOMY;  Surgeon: Eartha Angelia Sieving, MD;  Location: [facility];  Service: Gastroenterology;;   TONSILLECTOMY      Family History  Problem Relation Age of Onset   COPD Mother    Alzheimer's disease Father    Stomach cancer Maternal Grandmother    Social History:  reports that he has never smoked. He has never been exposed to tobacco smoke. He has never used smokeless tobacco. He reports that he does not drink alcohol and does not use drugs.  Allergies:  Allergies  Allergen Reactions   Metformin  And Related     Loose bloody stools    Medications Prior to Admission  Medication Sig Dispense Refill   acidophilus (RISAQUAD) CAPS capsule Take 1 capsule by mouth daily.     atorvastatin  (LIPITOR) 40 MG tablet TAKE ONE (1) TABLET EACH DAY 90 tablet 3   carvedilol  (COREG ) 3.125 MG tablet TAKE 1 TABLET BY MOUTH TWICE DAILY WITH MEALS 60 tablet 1   fenofibrate  160 MG tablet TAKE ONE (1) TABLET EACH DAY 90 tablet 0   fluticasone  (FLONASE ) 50 MCG/ACT nasal spray USE 1 SPRAY IN BOTH NOSTRILS TWICE DAILYAS NEEDED FOR ALLERGIES OR RHINITIS 16 g 5   levothyroxine  (SYNTHROID ) 50  MCG tablet TAKE ONE (1) TABLET EACH DAY 90 tablet 3   metroNIDAZOLE  (FLAGYL ) 500 MG tablet Take 1 tablet (500 mg total) by mouth 3 (three) times daily. 30 tablet 0   valsartan -hydrochlorothiazide  (DIOVAN -HCT) 320-25 MG tablet TAKE ONE TABLET BY MOUTH EACH DAY FOR BLOOD PRESSURE 90 tablet 0   aspirin  EC 81 MG tablet Take 81 mg by mouth daily. Swallow whole.  (Patient not taking: Reported on 11/05/2023)     blood glucose meter kit and supplies Dispense based on patient and insurance preference. Use up to four times daily as directed. (FOR ICD-10 E10.9, E11.9). 1 each 0   ciprofloxacin  (CIPRO ) 500 MG tablet Take 1 tablet (500 mg total) by mouth 2 (two) times daily. 20 tablet 0   rivaroxaban  (XARELTO ) 10 MG TABS tablet Take 1 tablet (10 mg total) by mouth daily. 90 tablet 3   sildenafil  (REVATIO ) 20 MG tablet Take 2-5 pills at once, orally, with each sexual encounter 50 tablet 5   testosterone  cypionate (DEPOTESTOSTERONE CYPIONATE) 200 MG/ML injection INJECT 0.5ML IM EVERY 7 DAYS 10 mL 1    Results for orders placed or performed during the hospital encounter of 12/25/23 (from the past 48 hours)  Glucose, capillary     Status: Abnormal   Collection Time: 12/25/23  6:58 AM  Result Value Ref Range   Glucose-Capillary 112 (H) 70 - 99 mg/dL    Comment: Glucose reference range applies only to samples taken after fasting for at least 8 hours.   No results found.  Review of Systems  All other systems reviewed and are negative.   Blood pressure 118/71, pulse 83, temperature 97.9 F (36.6 C), temperature source Oral, resp. rate 14, height 6' 1 (1.854 m), weight 114 kg, SpO2 95%. Physical Exam  GENERAL: The patient is AO x3, in no acute distress. HEENT: Head is normocephalic and atraumatic. EOMI are intact. Mouth is well hydrated and without lesions. NECK: Supple. No masses LUNGS: Clear to auscultation. No presence of rhonchi/wheezing/rales. Adequate chest expansion HEART: RRR, normal s1 and s2. ABDOMEN: Soft, nontender, no guarding, no peritoneal signs, and nondistended. BS +. No masses. EXTREMITIES: Without any cyanosis, clubbing, rash, lesions or edema. NEUROLOGIC: AOx3, no focal motor deficit. SKIN: no jaundice, no rashes  Assessment/Plan James [name] is a 58 y.o. male with past medical history of cavernomatous degeneration of the portal vein  with thrombosis of SMV, jugular veins, splenic vein, and intrahepatic veins thrombosis on Xarelto , diabetes, hypertension, OSA, hyperlipidemia, incisional hernia s/p repair, previous diverticulitis and C. diff, coming for evaluation of history of colon polyps.  Will proceed with colonoscopy.  Toribio Eartha Flavors, MD 12/25/2023, 8:18 AM

## 2023-12-25 NOTE — Anesthesia Preprocedure Evaluation (Signed)
 Anesthesia Evaluation  Patient identified by MRN, date of birth, ID band Patient awake    Reviewed: Allergy & Precautions, H&P , NPO status , Patient's Chart, lab work & pertinent test results, reviewed documented beta blocker date and time   Airway Mallampati: II  TM Distance: >3 FB Neck ROM: full    Dental no notable dental hx.    Pulmonary neg pulmonary ROS, sleep apnea    Pulmonary exam normal breath sounds clear to auscultation       Cardiovascular Exercise Tolerance: Good hypertension, negative cardio ROS  Rhythm:regular Rate:Normal     Neuro/Psych negative neurological ROS  negative psych ROS   GI/Hepatic negative GI ROS, Neg liver ROS,,,  Endo/Other  negative endocrine ROSdiabetesHypothyroidism    Renal/GU negative Renal ROS  negative genitourinary   Musculoskeletal   Abdominal   Peds  Hematology negative hematology ROS (+)   Anesthesia Other Findings   Reproductive/Obstetrics negative OB ROS                             Anesthesia Physical Anesthesia Plan  ASA: 2  Anesthesia Plan: General   Post-op Pain Management:    Induction:   PONV Risk Score and Plan: Propofol  infusion  Airway Management Planned:   Additional Equipment:   Intra-op Plan:   Post-operative Plan:   Informed Consent: I have reviewed the patients History and Physical, chart, labs and discussed the procedure including the risks, benefits and alternatives for the proposed anesthesia with the patient or authorized representative who has indicated his/her understanding and acceptance.     Dental Advisory Given  Plan Discussed with: CRNA  Anesthesia Plan Comments:        Anesthesia Quick Evaluation

## 2023-12-25 NOTE — Transfer of Care (Signed)
 Immediate Anesthesia Transfer of Care Note  Patient: James [name]  Procedure(s) Performed: COLONOSCOPY WITH PROPOFOL  POLYPECTOMY  Patient Location: Endoscopy Unit  Anesthesia Type:General  Level of Consciousness: drowsy  Airway & Oxygen Therapy: Patient Spontanous Breathing  Post-op Assessment: Report given to RN and Post -op Vital signs reviewed and stable  Post vital signs: Reviewed and stable  Last Vitals:  Vitals Value Taken Time  BP 109/46 12/25/23 0856  Temp 36.9 C 12/25/23 0856  Pulse 68 12/25/23 0856  Resp 14 12/25/23 0856  SpO2 98 % 12/25/23 0856    Last Pain:  Vitals:   12/25/23 0856  TempSrc: Oral  PainSc: 0-No pain         Complications: No notable events documented.

## 2023-12-25 NOTE — Op Note (Signed)
 [facility] Patient Name: James [name] Procedure Date: 12/25/2023 8:06 AM MRN: [id number] Date of Birth: [birth year] Attending MD: Toribio Fortune , , [id number] CSN: [id number] Age: 58 Admit Type: Outpatient Procedure:                Colonoscopy Indications:              Surveillance: History of numerous (> 10) adenomas                            on last colonoscopy (< 3 yrs) Providers:                Toribio Fortune, Leandrew Edelman RN, RN, Jon Loge Referring MD:              Medicines:                Monitored Anesthesia Care Complications:            No immediate complications. Estimated Blood Loss:     Estimated blood loss: none. Procedure:                Pre-Anesthesia Assessment:                           - Prior to the procedure, a History and Physical                            was performed, and patient medications, allergies                            and sensitivities were reviewed. The patient's                            tolerance of previous anesthesia was reviewed.                           - The risks and benefits of the procedure and the                            sedation options and risks were discussed with the                            patient. All questions were answered and informed                            consent was obtained.                           - ASA Grade Assessment: III - A patient with severe                            systemic disease.                           After obtaining informed consent, the colonoscope  was passed under direct vision. Throughout the                            procedure, the patient's blood pressure, pulse, and                            oxygen saturations were monitored continuously. The                            [id number]-HQ190L ([id number]) scope was introduced through                            the anus and advanced to the the cecum, identified                             by appendiceal orifice and ileocecal valve. The                            colonoscopy was performed without difficulty. The                            patient tolerated the procedure well. The quality                            of the bowel preparation was adequate to identify                            polyps greater than 5 mm in size. Scope In: 8:24:41 AM Scope Out: 8:52:24 AM Scope Withdrawal Time: 0 hours 17 minutes 42 seconds  Total Procedure Duration: 0 hours 27 minutes 43 seconds  Findings:      The perianal and digital rectal examinations were normal.      Five sessile polyps were found in the transverse colon, ascending colon       and cecum. The polyps were 2 to 8 mm in size. These polyps were removed       with a cold snare. Resection and retrieval were complete.      Scattered medium-mouthed and small-mouthed diverticula were found in the       sigmoid colon, descending colon and transverse colon. one of the       diverticula had associated erosion without purulence, likely recent area       of diverticulitis.      Non-bleeding internal hemorrhoids were found during retroflexion. The       hemorrhoids were small.      Anal papilla(e) were hypertrophied. Impression:               - Five 2 to 8 mm polyps in the transverse colon, in                            the ascending colon and in the cecum, removed with                            a cold snare. Resected and retrieved.                           -  Diverticulosis in the sigmoid colon, in the                            descending colon and in the transverse colon.                           - Non-bleeding internal hemorrhoids.                           - Anal papilla(e) were hypertrophied. Moderate Sedation:      Per Anesthesia Care Recommendation:           - Discharge patient to home (ambulatory).                           - Resume previous diet.                           - Await pathology results.                            - Repeat colonoscopy in 3 years for surveillance.                           - Restart Xarelto  tomorrow. Procedure Code(s):        --- Professional ---                           [phone], Colonoscopy, flexible; with removal of                            tumor(s), polyp(s), or other lesion(s) by snare                            technique Diagnosis Code(s):        --- Professional ---                           Z86.010, Personal history of colonic polyps                           D12.3, Benign neoplasm of transverse colon (hepatic                            flexure or splenic flexure)                           D12.2, Benign neoplasm of ascending colon                           D12.0, Benign neoplasm of cecum                           K64.8, Other hemorrhoids                           K62.89, Other specified diseases of anus and rectum  K57.30, Diverticulosis of large intestine without                            perforation or abscess without bleeding CPT copyright 2022 [employer]. All rights reserved. The codes documented in this report are preliminary and upon coder review may  be revised to meet current compliance requirements. Toribio Fortune, MD Toribio Fortune,  12/25/2023 8:57:45 AM This report has been signed electronically. Number of Addenda: 0

## 2023-12-26 LAB — SURGICAL PATHOLOGY

## 2023-12-27 ENCOUNTER — Encounter (INDEPENDENT_AMBULATORY_CARE_PROVIDER_SITE_OTHER): Payer: Self-pay | Admitting: *Deleted

## 2023-12-27 NOTE — Anesthesia Postprocedure Evaluation (Signed)
 Anesthesia Post Note  Patient: James [name]  Procedure(s) Performed: COLONOSCOPY WITH PROPOFOL  POLYPECTOMY  Patient location during evaluation: Phase II Anesthesia Type: General Level of consciousness: awake Pain management: pain level controlled Vital Signs Assessment: post-procedure vital signs reviewed and stable Respiratory status: spontaneous breathing and respiratory function stable Cardiovascular status: blood pressure returned to baseline and stable Postop Assessment: no headache and no apparent nausea or vomiting Anesthetic complications: no Comments: Late entry   No notable events documented.   Last Vitals:  Vitals:   12/25/23 0715 12/25/23 0856  BP: 118/71 (!) 109/46  Pulse: 83 68  Resp: 14 14  Temp: 36.6 C 36.9 C  SpO2: 95% 98%    Last Pain:  Vitals:   12/25/23 0856  TempSrc: Oral  PainSc: 0-No pain                 [name]

## 2023-12-28 ENCOUNTER — Encounter (HOSPITAL_COMMUNITY): Payer: Self-pay | Admitting: Gastroenterology

## 2024-01-01 ENCOUNTER — Encounter (INDEPENDENT_AMBULATORY_CARE_PROVIDER_SITE_OTHER): Payer: Self-pay | Admitting: *Deleted

## 2024-01-03 ENCOUNTER — Encounter: Payer: Self-pay | Admitting: Nurse Practitioner

## 2024-01-03 ENCOUNTER — Ambulatory Visit: Payer: BC Managed Care – PPO | Admitting: Nurse Practitioner

## 2024-01-03 VITALS — BP 143/83 | HR 84 | Ht 73.0 in | Wt 263.0 lb

## 2024-01-03 DIAGNOSIS — J0101 Acute recurrent maxillary sinusitis: Secondary | ICD-10-CM | POA: Diagnosis not present

## 2024-01-03 MED ORDER — AMOXICILLIN-POT CLAVULANATE 875-125 MG PO TABS: 1.0000 | ORAL_TABLET | Freq: Two times a day (BID) | ORAL | 0 refills | Status: DC

## 2024-01-03 NOTE — Progress Notes (Signed)
   Subjective:    Patient ID: James [name], male    DOB: Oct 03, 1966, 58 y.o.   MRN: [id number]  Sinus Problem This is a new problem. The current episode started in the past 7 days. The problem has been gradually worsening since onset. There has been no fever. The pain is mild. Associated symptoms include congestion, coughing and sinus pressure. Pertinent negatives include no chills. Treatments tried: flonase and nyquil. The treatment provided mild relief.      Review of Systems  Constitutional:  Negative for chills and fever.  HENT:  Positive for congestion and sinus pressure.   Respiratory:  Positive for cough.        Objective:   Physical Exam Constitutional:      Appearance: Normal appearance.  Cardiovascular:     Rate and Rhythm: Normal rate and regular rhythm.     Heart sounds: Normal heart sounds.  Pulmonary:     Effort: Pulmonary effort is normal.     Breath sounds: Normal breath sounds.  Skin:    General: Skin is warm.  Neurological:     General: No focal deficit present.     Mental Status: He is alert and oriented to person, place, and time.  Psychiatric:        Mood and Affect: Mood normal.        Behavior: Behavior normal.    BP (!) 143/83   Pulse 84   Ht 6\' 1"  (1.854 m)   Wt 263 lb (119.3 kg)   SpO2 97%   BMI 34.70 kg/m         Assessment & Plan:  James [name] in today with chief complaint of Sinus Problem and Ears hurting   1. Acute recurrent maxillary sinusitis (Primary) 1. Take meds as prescribed 2. Use a cool mist humidifier especially during the winter months and when heat has been humid. 3. Use saline nose sprays frequently 4. Saline irrigations of the nose can be very helpful if done frequently.  * 4X daily for 1 week*  * Use of a nettie pot can be helpful with this. Follow directions with this* 5. Drink plenty of fluids 6. Keep thermostat turn down low 7.For any cough or congestion- mucinex 8. For fever or aces or pains- take  tylenol or ibuprofen appropriate for age and weight.  * for fevers greater than 101 orally you may alternate ibuprofen and tylenol every  3 hours.   Meds ordered this encounter  Medications   amoxicillin-clavulanate (AUGMENTIN) 875-125 MG tablet    Sig: Take 1 tablet by mouth 2 (two) times daily.    Dispense:  14 tablet    Refill:  0    Supervising Provider:   Arville Care A [[id number]]       The above assessment and management plan was discussed with the patient. The patient verbalized understanding of and has agreed to the management plan. Patient is aware to call the clinic if symptoms persist or worsen. Patient is aware when to return to the clinic for a follow-up visit. Patient educated on when it is appropriate to go to the emergency department.   Mary-Margaret Daphine Deutscher, FNP

## 2024-01-03 NOTE — Patient Instructions (Signed)

## 2024-01-15 ENCOUNTER — Inpatient Hospital Stay: Payer: BC Managed Care – PPO | Attending: Hematology

## 2024-01-21 ENCOUNTER — Encounter: Payer: Self-pay | Admitting: Physician Assistant

## 2024-01-21 NOTE — Progress Notes (Deleted)
 VIRTUAL VISIT via TELEPHONE NOTE [facility]   I connected with James [name]  on *** at  *** by telephone and verified that I am speaking with the correct person using two identifiers.  Location: Patient: Home Provider: [facility]   I discussed the limitations, risks, security and privacy concerns of performing an evaluation and management service by telephone and the availability of in person appointments. I also discussed with the patient that there may be a patient responsible charge related to this service. The patient expressed understanding and agreed to proceed.  REASON FOR VISIT: Splanchnic vein thrombosis and iron deficiency anemia  CURRENT THERAPY: Xarelto  + iron tablet  INTERVAL HISTORY:  James [name] is contacted today for follow-up of iron deficiency anemia and splanchnic vein thrombosis.  He was last seen by Pleasant Barefoot PA-C on 06/13/2023.  At today's visit, he reports feeling ***.    *** He is taking iron tablet daily since June 2024.  He is also taking vitamin B12 1000 mcg daily and copper  2 mg daily.  *** *** He reports energy is ***. *** He continues to eat ice on a regular basis. *** He remains on Xarelto  for history of splanchnic vein thrombosis. *** He has occasional black/tarry bowel movements, but not as often as before.  *** *** Liver disease?  ***He reports 75***% energy and 100***% appetite.  He has been maintaining a stable weight since his last visit.  ASSESSMENT & PLAN:  1.   Iron deficiency anemia - Developed iron deficiency anemia in November 2022, again in May 2023. - EGD (11/22/2021): Very subtle grade 1 varices found in lower third of esophagus, small in size - Colonoscopy (11/22/2021): Polyps x 10 throughout colon removed - EGD (02/06/2023): Grade 1 esophageal varices, 1 cm hiatal hernia, portal hypertensive gastropathy - His previous hematologist (Dr. Darcy of [facility]) recommended IV iron therapy, but this was  not covered by insurance therefore patient declines. - Taking ferrous sulfate 325 mg daily.  *** - Most recent labs (09/13/2023): Hgb 16.6/MCV 93.9.  Ferritin 69, iron saturation 44% with normal TIBC.  Prior labs showed mild CKD stage II/IIIa - Intermittent black tarry bowel movements*** - Likely has some underlying malabsorption due to small bowel resection in June 2022  - PLAN: Continue ferrous sulfate 325 mg daily.  Recheck CBC with iron panel in 4*** months followed by PHONE visit ***  2.  Mild thrombocytopenia, with copper  deficiency - Mild thrombocytopenia since November 2022, unclear etiology - PNH screen from [facility] hematology was negative in July 2022 - No associated anemia or white cell count abnormalities to suggest primary bone marrow problem such as myelofibrosis - CTAP (10/07/2021): Spleen size normal - Prior testing was negative for hepatitis C and HIV. - Additional labs (05/21/2023): Hepatitis B negative. Normal ANA and rheumatoid factor. Immature platelet fraction normal at 7.2%. SPEP normal.  Minimally elevated kappa free light chain 21.7, normal lambda 19.4, normal ratio 1.12. COPPER  DEFICIENCY (serum copper  low at 54).  Normal B12, MMA.  Normal folate. - Patient taking vitamin B12 1000 mcg daily (labs from 05/21/2023 show normal B12 at 866, normal MMA).  Taking copper  2 mg daily since June 2024.  *** - Most recent labs (09/13/2023): Platelets 132.  Copper  remains low at 53. - DIFFERENTIAL DIAGNOSIS favors thrombocytopenia secondary to copper  deficiency.  Suspect that he has malabsorption of copper  in the setting of prior small bowel resection.  Question that he may also have  some underlying liver disease (?). - PLAN: *** Abdominal ultrasound to evaluate for any underlying liver disease or splenomegaly *** - Increase copper  supplementation to 6 mg daily *** - Recheck copper  in 4 months, along with serum ceruloplasmin.  Will recheck B12/MMA and folate - PHONE visit in 4 months (2  weeks after labs)  3.  OTHER ISSUES (not addressed during today's phone visit - see full note from 06/13/2023 for details) - Splanchnic vein thrombosis: Continue on Xarelto  10 mg daily indefinitely - Right axillary lymphadenopathy, resolved - Hypogonadism, on testosterone  supplementation  4.  Other history - He is retired emergency planning/management officer, and now works as a product/process development scientist.  Non-smoker. - No family history of DVT/PE. - Maternal grandmother had stomach cancer.  PLAN SUMMARY: >> US  abdomen >> Labs in 4 months = *** >> PHONE visit in 4 months (2 weeks after labs) ***    REVIEW OF SYSTEMS: ***  ROS   PHYSICAL EXAM: (per limitations of virtual telephone visit)  The patient is alert and oriented x 3, exhibiting adequate mentation, good mood, and ability to speak in full sentences and execute sound judgement.***  WRAP UP: ***  I discussed the assessment and treatment plan with the patient. The patient was provided an opportunity to ask questions and all were answered. The patient agreed with the plan and demonstrated an understanding of the instructions.   The patient was advised to call back or seek an in-person evaluation if the symptoms worsen or if the condition fails to improve as anticipated.  I provided *** minutes of non-face-to-face time during this encounter.  Pleasant CHRISTELLA Barefoot, PA-C ***

## 2024-01-22 ENCOUNTER — Inpatient Hospital Stay: Payer: BC Managed Care – PPO | Admitting: Physician Assistant

## 2024-02-01 ENCOUNTER — Other Ambulatory Visit: Payer: Self-pay | Admitting: Family Medicine

## 2024-02-01 DIAGNOSIS — E1169 Type 2 diabetes mellitus with other specified complication: Secondary | ICD-10-CM

## 2024-02-11 ENCOUNTER — Other Ambulatory Visit: Payer: Self-pay | Admitting: *Deleted

## 2024-02-11 DIAGNOSIS — K55069 Acute infarction of intestine, part and extent unspecified: Secondary | ICD-10-CM

## 2024-02-11 MED ORDER — RIVAROXABAN 10 MG PO TABS: 10.0000 mg | ORAL_TABLET | Freq: Every day | ORAL | 0 refills | Status: DC

## 2024-02-13 ENCOUNTER — Inpatient Hospital Stay: Payer: BC Managed Care – PPO | Attending: Hematology

## 2024-02-13 DIAGNOSIS — D509 Iron deficiency anemia, unspecified: Secondary | ICD-10-CM | POA: Diagnosis not present

## 2024-02-13 DIAGNOSIS — E610 Copper deficiency: Secondary | ICD-10-CM

## 2024-02-13 DIAGNOSIS — D500 Iron deficiency anemia secondary to blood loss (chronic): Secondary | ICD-10-CM

## 2024-02-13 DIAGNOSIS — D696 Thrombocytopenia, unspecified: Secondary | ICD-10-CM

## 2024-02-13 LAB — CBC WITH DIFFERENTIAL/PLATELET
Abs Immature Granulocytes: 0.07 10*3/uL (ref 0.00–0.07)
Basophils Absolute: 0.1 10*3/uL (ref 0.0–0.1)
Basophils Relative: 0 %
Eosinophils Absolute: 0.2 10*3/uL (ref 0.0–0.5)
Eosinophils Relative: 2 %
HCT: 45.8 % (ref 39.0–52.0)
Hemoglobin: 15.4 g/dL (ref 13.0–17.0)
Immature Granulocytes: 1 %
Lymphocytes Relative: 9 %
Lymphs Abs: 1.2 10*3/uL (ref 0.7–4.0)
MCH: 31.6 pg (ref 26.0–34.0)
MCHC: 33.6 g/dL (ref 30.0–36.0)
MCV: 94 fL (ref 80.0–100.0)
Monocytes Absolute: 1.3 10*3/uL — ABNORMAL HIGH (ref 0.1–1.0)
Monocytes Relative: 10 %
Neutro Abs: 9.9 10*3/uL — ABNORMAL HIGH (ref 1.7–7.7)
Neutrophils Relative %: 78 %
Platelets: 124 10*3/uL — ABNORMAL LOW (ref 150–400)
RBC: 4.87 MIL/uL (ref 4.22–5.81)
RDW: 14.2 % (ref 11.5–15.5)
WBC: 12.6 10*3/uL — ABNORMAL HIGH (ref 4.0–10.5)
nRBC: 0 % (ref 0.0–0.2)

## 2024-02-13 LAB — FERRITIN: Ferritin: 146 ng/mL (ref 24–336)

## 2024-02-13 LAB — IRON AND TIBC
Iron: 35 ug/dL — ABNORMAL LOW (ref 45–182)
Saturation Ratios: 10 % — ABNORMAL LOW (ref 17.9–39.5)
TIBC: 359 ug/dL (ref 250–450)
UIBC: 324 ug/dL

## 2024-02-15 LAB — ZINC: Zinc: 51 ug/dL (ref 44–115)

## 2024-02-15 LAB — COPPER, SERUM: Copper: 73 ug/dL (ref 69–132)

## 2024-02-26 ENCOUNTER — Ambulatory Visit: Payer: BC Managed Care – PPO | Admitting: Family Medicine

## 2024-02-26 ENCOUNTER — Encounter: Payer: Self-pay | Admitting: Family Medicine

## 2024-02-26 VITALS — BP 121/61 | HR 81 | Temp 97.5°F | Ht 73.0 in | Wt 245.0 lb

## 2024-02-26 DIAGNOSIS — E1169 Type 2 diabetes mellitus with other specified complication: Secondary | ICD-10-CM | POA: Diagnosis not present

## 2024-02-26 DIAGNOSIS — E119 Type 2 diabetes mellitus without complications: Secondary | ICD-10-CM | POA: Diagnosis not present

## 2024-02-26 DIAGNOSIS — E039 Hypothyroidism, unspecified: Secondary | ICD-10-CM

## 2024-02-26 DIAGNOSIS — E1159 Type 2 diabetes mellitus with other circulatory complications: Secondary | ICD-10-CM

## 2024-02-26 DIAGNOSIS — E785 Hyperlipidemia, unspecified: Secondary | ICD-10-CM | POA: Diagnosis not present

## 2024-02-26 DIAGNOSIS — I152 Hypertension secondary to endocrine disorders: Secondary | ICD-10-CM

## 2024-02-26 LAB — BAYER DCA HB A1C WAIVED: HB A1C (BAYER DCA - WAIVED): 5.4 % (ref 4.8–5.6)

## 2024-02-26 MED ORDER — DOCUSATE SODIUM 100 MG PO CAPS: 100.0000 mg | ORAL_CAPSULE | Freq: Two times a day (BID) | ORAL | 5 refills | Status: AC

## 2024-02-26 MED ORDER — PSYLLIUM 30.9 % PO POWD: ORAL | Status: AC

## 2024-02-26 NOTE — Progress Notes (Unsigned)
 [facility] [address], [place] [address]   CLINIC:  Medical Oncology/Hematology  PCP:  Mechele Claude, MD [address] [place] [address] [phone]   REASON FOR VISIT: Splanchnic vein thrombosis and iron deficiency anemia  CURRENT THERAPY: Xarelto + iron tablet  INTERVAL HISTORY:   [name] 59 y.o. male returns for routine follow-up of iron deficiency anemia and splanchnic vein thrombosis.  He was last seen by Rojelio Brenner PA-C on 06/13/2023.  At today's visit, he reports feeling fairly well, apart from some increased fatigue from having to temporarily switch to working third shift.   He is taking iron tablet daily since June 2024.  He is also taking vitamin B12 1000 mcg daily and copper 2 mg daily. He denies any ice pica, headaches, or lightheadedness.  He remains on Xarelto for history of splanchnic vein thrombosis.  He reports dark bowel movements from iron tablet.  He has occasional hemorrhoid bleeding, which is not severe.  No abdominal pain, nausea, or vomiting.  He reports 75% energy and 100% appetite.  He has been maintaining a stable weight since his last visit.  ASSESSMENT & PLAN:  1.   Iron deficiency anemia - Developed iron deficiency anemia in November 2022, again in May 2023. - EGD (11/22/2021): Very subtle grade 1 varices found in lower third of esophagus, small in size - Colonoscopy (11/22/2021): Polyps x 10 throughout colon removed - Repeat colonoscopy (12/25/2023): Polyps x 5 (tubular adenoma), diverticulosis, nonbleeding internal hemorrhoids.  GI planning for repeat colonoscopy in 3 years. - EGD (02/06/2023): Grade 1 esophageal varices, 1 cm hiatal hernia, portal hypertensive gastropathy - His previous hematologist (Dr. Isaiah Serge of [facility]) recommended IV iron therapy, but this was not covered by insurance therefore patient declines. - Taking ferrous sulfate 325 mg daily.   - Most recent labs (02/13/2024): Hgb 15.4/MCV 94.0.  Ferritin 146,  iron saturation 10% with normal TIBC.  Prior labs showed mild CKD stage II/IIIa - Intermittent black bowel movements, possibly from iron.  Occasional mild hemorrhoid bleeding. - Likely has some underlying malabsorption due to small bowel resection in June 2022  - PLAN: Continue ferrous sulfate 325 mg daily.  Recheck CBC with iron panel in 6 months followed by OFFICE visit   2.  Mild thrombocytopenia, with copper deficiency - Mild thrombocytopenia since November 2022, unclear etiology - PNH screen from [facility] hematology was negative in July 2022 - No associated anemia or white cell count abnormalities to suggest primary bone marrow problem such as myelofibrosis - CTAP (10/07/2021): Spleen size normal - CTA abdomen/pelvis (12/09/2023): Splenomegaly with craniocaudal length 17.6 cm.  Persistent portal vein occlusion. - Prior testing was negative for hepatitis C and HIV. - Additional labs (05/21/2023): Hepatitis B negative. Normal ANA and rheumatoid factor. Immature platelet fraction normal at 7.2%. SPEP normal.  Minimally elevated kappa free light chain 21.7, normal lambda 19.4, normal ratio 1.12. COPPER DEFICIENCY (serum copper low at 54).  Normal B12, MMA.  Normal folate. - Patient taking vitamin B12 1000 mcg daily + copper 2 mg daily  - Most recent labs (02/13/2024): Platelets 124.  Copper 73, normal zinc. - DIFFERENTIAL DIAGNOSIS favors thrombocytopenia secondary to copper deficiency.  Suspect that he has malabsorption of copper in the setting of prior small bowel resection.  Thrombocytopenia likely secondary to splenomegaly caused by chronic portal vein occlusion/splanchnic vein thrombosis.   - PLAN: Continue copper supplementation 2 mg daily  - Labs in 6 months to repeat copper, B12, MMA,  and folate along with CBC/D.  3.  Splanchnic vein thrombosis - Seen at the request of Dr. Isaiah Serge (benign hematology) at [facility] - Diagnosis preceded by 2 months of diarrhea and 2 weeks of abdominal pain -  Diagnosed 06/13/2021: Thrombosis of SMV, several jejunal veins, splenic vein, main portal vein, central right and left intrahepatic portal veins - VTE risk factors: Testosterone therapy x 10 years, with secondary erythrocytosis at the time of presentation (Hgb 17.3 on 05/27/2021) Preceding diverticulitis  COVID about 6 months prior to blood clot - Small bowel resection, approximately 55 cm (06/14/2021) - IR intervention with portal vein thrombectomy and angioplasty on 06/15/2021 - Treated with >6 months of Xarelto 20 mg daily, switched to prophylactic dose (Xarelto 10 mg daily) in May 2023 - JAK2 V617F testing negative (July 2022).  Antiphospholipid antibody testing triple negative. - Patient expressed desire to come off of Xarelto, but this was advised against. [facility] hematology (Dr. Isaiah Serge) favored continued prophylactic anticoagulant, especially since patient is back on testosterone treatment now. - Per Dr. Ellin Saba, indefinite anticoagulation favored due to unprovoked extensive splanchnic vein thrombosis with etiology unclear. - CT angio abdomen/pelvis (12/09/2023): Chronic portal vein occlusion with cavernous transformation of the portal vein. - PLAN: Continue Xarelto 10 mg daily indefinitely  4.  Lymphadenopathy (right axillary), RESOLVED - Subcentimeter lymph node in the right axillary region noted by Dr. Ellin Saba on 02/02/2023 - No palpable lymphadenopathy on exam today (02/27/24)   5.  Hypogonadism, on testosterone supplementation - Testosterone supplementation was stopped after he was diagnosed with splanchnic vein thrombosis - He restarted in May 2023, currently taking testosterone 100 mg every 7 days - Most recent testosterone level 991 (02/02/2023) - PLAN: Continue management as per Dr. Darlyn Read (PCP)   6.  Other history - He is retired Emergency planning/management officer, and now works as a Product/process development scientist.  Non-smoker. - No family history of DVT/PE. - Maternal grandmother had stomach  cancer.  PLAN SUMMARY: >> Labs in 6 months = CBC/D, CMP, ferritin, iron/TIBC, copper, B12, MMA, folate >> OFFICE visit in 6 months (2 weeks after labs)     REVIEW OF SYSTEMS:   Review of Systems  Constitutional:  Negative for appetite change, chills, diaphoresis, fatigue, fever and unexpected weight change.  HENT:   Negative for lump/mass and nosebleeds.   Eyes:  Negative for eye problems.  Respiratory:  Negative for cough, hemoptysis and shortness of breath.   Cardiovascular:  Negative for chest pain, leg swelling and palpitations.  Gastrointestinal:  Positive for constipation and diarrhea. Negative for abdominal pain, blood in stool, nausea and vomiting.  Genitourinary:  Negative for hematuria.   Skin: Negative.   Neurological:  Negative for dizziness, headaches and light-headedness.  Hematological:  Does not bruise/bleed easily.     PHYSICAL EXAM:  ECOG PERFORMANCE STATUS: 0 - Asymptomatic  Vitals:   02/27/24 0842  BP: 133/80  Pulse: 79  Resp: 18  Temp: 98.2 F (36.8 C)  SpO2: 99%   Filed Weights   02/27/24 0842  Weight: 253 lb 4.9 oz (114.9 kg)   Physical Exam Constitutional:      Appearance: Normal appearance. He is obese.  Cardiovascular:     Heart sounds: Normal heart sounds.  Pulmonary:     Breath sounds: Normal breath sounds.  Lymphadenopathy:     Cervical: No cervical adenopathy.     Upper Body:     Right upper body: No axillary adenopathy.     Left upper body: No axillary adenopathy.  Neurological:     General: No focal deficit present.     Mental Status: Mental status is at baseline.  Psychiatric:        Behavior: Behavior normal. Behavior is cooperative.    PAST MEDICAL/SURGICAL HISTORY:  Past Medical History:  Diagnosis Date   Arthritis    Diverticula of colon    Hx of exploratory laparotomy    enterectomy small intest. resection,celiotomy   Hyperlipidemia    Hypertension    Hypothyroidism    Pre-diabetes    Sleep apnea    Past  Surgical History:  Procedure Laterality Date   BACK SURGERY     BIOPSY  11/22/2021   Procedure: BIOPSY;  Surgeon: Dolores Frame, MD;  [facility]: [facility];  Service: Gastroenterology;;   COLONOSCOPY WITH PROPOFOL N/A 11/22/2021   Procedure: COLONOSCOPY WITH PROPOFOL;  Surgeon: Dolores Frame, MD;  Location: [facility];  Service: Gastroenterology;  Laterality: N/A;  8:45   COLONOSCOPY WITH PROPOFOL N/A 12/25/2023   Procedure: COLONOSCOPY WITH PROPOFOL;  Surgeon: Dolores Frame, MD;  [facility]: [facility];  Service: Gastroenterology;  Laterality: N/A;  8:15am;asa 1   ESOPHAGOGASTRODUODENOSCOPY (EGD) WITH PROPOFOL N/A 11/22/2021   Procedure: ESOPHAGOGASTRODUODENOSCOPY (EGD) WITH PROPOFOL;  Surgeon: Dolores Frame, MD;  [facility]: [facility];  Service: Gastroenterology;  Laterality: N/A;   ESOPHAGOGASTRODUODENOSCOPY (EGD) WITH PROPOFOL N/A 02/06/2023   Procedure: ESOPHAGOGASTRODUODENOSCOPY (EGD) WITH PROPOFOL;  Surgeon: Dolores Frame, MD;  [facility]: [facility];  Service: Gastroenterology;  Laterality: N/A;  2:15 pm   INCISIONAL HERNIA REPAIR N/A 11/23/2021   Procedure: OPEN COMPONENT SEPARATION AND MESH ABDOMINAL WALL RECONSTRUCTION FOR INCISIONAL HERNIA REPAIR, LYSIS OF ADHESIONS AND TRANSABDOMINAL PLANE BLOCK;  Surgeon: Karie Soda, MD;  Location: [facility];  Service: General;  Laterality: N/A;   POLYPECTOMY  11/22/2021   Procedure: POLYPECTOMY;  Surgeon: Dolores Frame, MD;  [facility]: [facility];  Service: Gastroenterology;;   POLYPECTOMY  12/25/2023   Procedure: POLYPECTOMY;  Surgeon: Dolores Frame, MD;  Location: [facility];  Service: Gastroenterology;;   TONSILLECTOMY      SOCIAL HISTORY:  Social History   Socioeconomic History   Marital status: Married    Spouse name: Not on file   Number of children: Not on file   Years of education: Not on file   Highest education level: 12th grade   Occupational History   Not on file  Tobacco Use   Smoking status: Never    Passive exposure: Never   Smokeless tobacco: Never  Vaping Use   Vaping status: Never Used  Substance and Sexual Activity   Alcohol use: No    Comment: rare   Drug use: No   Sexual activity: Yes    Birth control/protection: None  Other Topics Concern   Not on file  Social History Narrative   He is currently a Lawyer.    Social Drivers of Health   Financial Resource Strain: High Risk (02/25/2024)   Overall Financial Resource Strain (CARDIA)    Difficulty of Paying Living Expenses: Hard  Food Insecurity: Patient Declined (02/25/2024)   Hunger Vital Sign    Worried About Running Out of Food in the Last Year: Patient declined    Ran Out of Food in the Last Year: Patient declined  Transportation Needs: No Transportation Needs (02/25/2024)   PRAPARE - Administrator, Civil Service (Medical): No    Lack of Transportation (Non-Medical): No  Physical Activity: Sufficiently Active (02/25/2024)  Exercise Vital Sign    Days of Exercise per Week: 4 days    Minutes of Exercise per Session: 60 min  Stress: Stress Concern Present (02/25/2024)   [employer] of Occupational Health - Occupational Stress Questionnaire    Feeling of Stress : To some extent  Social Connections: Unknown (02/25/2024)   Social Connection and Isolation Panel [NHANES]    Frequency of Communication with Friends and Family: More than three times a week    Frequency of Social Gatherings with Friends and Family: Twice a week    Attends Religious Services: Patient declined    Database administrator or Organizations: Patient declined    Attends Banker Meetings: Not on file    Marital Status: Married  Intimate Partner Violence: Not At Risk (02/02/2023)   Humiliation, Afraid, Rape, and Kick questionnaire    Fear of Current or Ex-Partner: No    Emotionally Abused: No    Physically Abused: No    Sexually  Abused: No    FAMILY HISTORY:  Family History  Problem Relation Age of Onset   COPD Mother    Alzheimer's disease Father    Stomach cancer Maternal Grandmother     CURRENT MEDICATIONS:  Outpatient Encounter Medications as of 02/27/2024  Medication Sig   acidophilus (RISAQUAD) CAPS capsule Take 1 capsule by mouth daily.   aspirin EC 81 MG tablet Take 81 mg by mouth daily. Swallow whole.   atorvastatin (LIPITOR) 40 MG tablet TAKE ONE (1) TABLET EACH DAY   blood glucose meter kit and supplies Dispense based on patient and insurance preference. Use up to four times daily as directed. (FOR ICD-10 E10.9, E11.9).   carvedilol (COREG) 3.125 MG tablet TAKE 1 TABLET BY MOUTH TWICE DAILY WITH MEALS   docusate sodium (COLACE) 100 MG capsule Take 1 capsule (100 mg total) by mouth 2 (two) times daily. To soften stool   fenofibrate 160 MG tablet TAKE ONE (1) TABLET EACH DAY   fluticasone (FLONASE) 50 MCG/ACT nasal spray USE 1 SPRAY IN BOTH NOSTRILS TWICE DAILYAS NEEDED FOR ALLERGIES OR RHINITIS   levothyroxine (SYNTHROID) 50 MCG tablet TAKE ONE (1) TABLET EACH DAY   Psyllium 30.9 % POWD Drink 1 tablespoon dissolved in water, twice Daily   rivaroxaban (XARELTO) 10 MG TABS tablet Take 1 tablet (10 mg total) by mouth daily.   sildenafil (REVATIO) 20 MG tablet Take 2-5 pills at once, orally, with each sexual encounter   testosterone cypionate (DEPOTESTOSTERONE CYPIONATE) 200 MG/ML injection INJECT 0.5ML IM EVERY 7 DAYS   valsartan-hydrochlorothiazide (DIOVAN-HCT) 320-25 MG tablet TAKE ONE TABLET BY MOUTH EACH DAY FOR BLOOD PRESSURE   No facility-administered encounter medications on file as of 02/27/2024.    ALLERGIES:  Allergies  Allergen Reactions   Metformin And Related     Loose bloody stools    LABORATORY DATA:  I have reviewed the labs as listed.  CBC    Component Value Date/Time   WBC 6.5 02/26/2024 1021   WBC 12.6 (H) 02/13/2024 0839   RBC 4.85 02/26/2024 1021   RBC 4.87 02/13/2024  0839   HGB 15.3 02/26/2024 1021   HCT 45.7 02/26/2024 1021   PLT 144 (L) 02/26/2024 1021   MCV 94 02/26/2024 1021   MCH 31.5 02/26/2024 1021   MCH 31.6 02/13/2024 0839   MCHC 33.5 02/26/2024 1021   MCHC 33.6 02/13/2024 0839   RDW 13.2 02/26/2024 1021   LYMPHSABS 1.0 02/26/2024 1021   MONOABS 1.3 (H) 02/13/2024 1478  EOSABS 0.2 02/26/2024 1021   BASOSABS 0.1 02/26/2024 1021      Latest Ref Rng & Units 02/26/2024   10:21 AM 12/09/2023    1:34 AM 08/28/2023    9:39 AM  CMP  Glucose 70 - 99 mg/dL 130  865  784   BUN 6 - 24 mg/dL 13  19  20    Creatinine 0.76 - 1.27 mg/dL 6.96  2.95  2.84   Sodium 134 - 144 mmol/L 136  137  136   Potassium 3.5 - 5.2 mmol/L 4.0  3.7  4.2   Chloride 96 - 106 mmol/L 100  104  98   CO2 20 - 29 mmol/L 23  22  21    Calcium 8.7 - 10.2 mg/dL 9.9  9.6  13.2   Total Protein 6.0 - 8.5 g/dL 6.7  7.0  6.7   Total Bilirubin 0.0 - 1.2 mg/dL 1.2  2.0  1.5   Alkaline Phos 44 - 121 IU/L 41  38  42   AST 0 - 40 IU/L 35  45  40   ALT 0 - 44 IU/L 33  58  52     DIAGNOSTIC IMAGING:  I have independently reviewed the relevant imaging and discussed with the patient.   WRAP UP:  All questions were answered. The patient knows to call the clinic with any problems, questions or concerns.  Medical decision making: Low  Time spent on visit: I spent 15 minutes counseling the patient face to face. The total time spent in the appointment was 22 minutes and more than 50% was on counseling.  Carnella Guadalajara, PA-C  02/27/24 9:29 AM

## 2024-02-26 NOTE — Progress Notes (Signed)
 Subjective:  Patient ID: James [name], male    DOB: [birth year]  Age: 58 y.o. MRN: [id number]  CC: Medical Management of Chronic Issues, Insomnia (Trouble staying asleep. Taking OTC but not helping. Leaving 3rd stiff shortly.), and GI Problem (Back and forth between constipation and urgency. Dx with IBS. Taking probiotic which has helped. )   HPI James [name] presents for  follow-up of hypertension. Patient has no history of headache chest pain or shortness of breath or recent cough. Patient also denies symptoms of TIA such as focal numbness or weakness. Patient denies side effects from medication. States taking it regularly.   follow-up on  thyroid. The patient has a history of hypothyroidism for many years. It has been stable recently. Pt. denies any change in  voice, loss of hair, heat or cold intolerance. Energy level has been adequate to good. Decreased due to recent stint on 3rd shift.  Patient denies constipation and diarrhea. No myxedema. Medication is as noted below. Verified that pt is taking it daily on an empty stomach. Well tolerated.   in for follow-up of elevated cholesterol. Doing well without complaints on current medication. Denies side effects of statin including myalgia and arthralgia and nausea. Currently no chest pain, shortness of breath or other cardiovascular related symptoms noted.  Using shots for testosterone replacement  Recent flare of diverticulitis.     History Rhythm has a past medical history of Arthritis, Diverticula of colon, exploratory laparotomy, Hyperlipidemia, Hypertension, Hypothyroidism, Pre-diabetes, and Sleep apnea.   He has a past surgical history that includes Back surgery; Tonsillectomy; Incisional hernia repair (N/A, 11/23/2021); Colonoscopy with propofol (N/A, 11/22/2021); Esophagogastroduodenoscopy (egd) with propofol (N/A, 11/22/2021); polypectomy (11/22/2021); biopsy (11/22/2021); Esophagogastroduodenoscopy (egd) with propofol (N/A,  02/06/2023); Colonoscopy with propofol (N/A, 12/25/2023); and polypectomy (12/25/2023).   His family history includes Alzheimer's disease in his father; COPD in his mother; Stomach cancer in his maternal grandmother.He reports that he has never smoked. He has never been exposed to tobacco smoke. He has never used smokeless tobacco. He reports that he does not drink alcohol and does not use drugs.  Current Outpatient Medications on File Prior to Visit  Medication Sig Dispense Refill   acidophilus (RISAQUAD) CAPS capsule Take 1 capsule by mouth daily.     aspirin EC 81 MG tablet Take 81 mg by mouth daily. Swallow whole.     atorvastatin (LIPITOR) 40 MG tablet TAKE ONE (1) TABLET EACH DAY 90 tablet 3   blood glucose meter kit and supplies Dispense based on patient and insurance preference. Use up to four times daily as directed. (FOR ICD-10 E10.9, E11.9). 1 each 0   carvedilol (COREG) 3.125 MG tablet TAKE 1 TABLET BY MOUTH TWICE DAILY WITH MEALS 60 tablet 1   fenofibrate 160 MG tablet TAKE ONE (1) TABLET EACH DAY 90 tablet 0   fluticasone (FLONASE) 50 MCG/ACT nasal spray USE 1 SPRAY IN BOTH NOSTRILS TWICE DAILYAS NEEDED FOR ALLERGIES OR RHINITIS 16 g 5   levothyroxine (SYNTHROID) 50 MCG tablet TAKE ONE (1) TABLET EACH DAY 90 tablet 3   rivaroxaban (XARELTO) 10 MG TABS tablet Take 1 tablet (10 mg total) by mouth daily. 30 tablet 0   sildenafil (REVATIO) 20 MG tablet Take 2-5 pills at once, orally, with each sexual encounter 50 tablet 5   testosterone cypionate (DEPOTESTOSTERONE CYPIONATE) 200 MG/ML injection INJECT 0.5ML IM EVERY 7 DAYS 10 mL 1   valsartan-hydrochlorothiazide (DIOVAN-HCT) 320-25 MG tablet TAKE ONE TABLET BY MOUTH EACH DAY FOR BLOOD  PRESSURE 90 tablet 0   No current facility-administered medications on file prior to visit.    ROS Review of Systems  Constitutional: Negative.   HENT: Negative.    Eyes:  Negative for visual disturbance.  Respiratory:  Negative for cough and shortness  of breath.   Cardiovascular:  Negative for chest pain and leg swelling.  Gastrointestinal:  Negative for abdominal pain, diarrhea, nausea and vomiting.  Genitourinary:  Negative for difficulty urinating.  Musculoskeletal:  Negative for arthralgias and myalgias.  Skin:  Negative for rash.  Neurological:  Negative for headaches.  Psychiatric/Behavioral:  Negative for sleep disturbance.     Objective:  BP 121/61   Pulse 81   Temp (!) 97.5 F (36.4 C)   Ht 6\' 1"  (1.854 m)   Wt 245 lb (111.1 kg)   SpO2 96%   BMI 32.32 kg/m   BP Readings from Last 3 Encounters:  02/27/24 133/80  02/26/24 121/61  01/03/24 (!) 143/83    Wt Readings from Last 3 Encounters:  02/27/24 253 lb 4.9 oz (114.9 kg)  02/26/24 245 lb (111.1 kg)  01/03/24 263 lb (119.3 kg)     Physical Exam Constitutional:      General: He is not in acute distress.    Appearance: He is well-developed.  HENT:     Head: Normocephalic and atraumatic.     Right Ear: External ear normal.     Left Ear: External ear normal.     Nose: Nose normal.  Eyes:     Conjunctiva/sclera: Conjunctivae normal.     Pupils: Pupils are equal, round, and reactive to light.  Cardiovascular:     Rate and Rhythm: Normal rate and regular rhythm.     Heart sounds: Normal heart sounds. No murmur heard. Pulmonary:     Effort: Pulmonary effort is normal. No respiratory distress.     Breath sounds: Normal breath sounds. No wheezing or rales.  Abdominal:     Palpations: Abdomen is soft.     Tenderness: There is no abdominal tenderness.  Musculoskeletal:        General: Normal range of motion.     Cervical back: Normal range of motion and neck supple.  Skin:    General: Skin is warm and dry.  Neurological:     Mental Status: He is alert and oriented to person, place, and time.     Deep Tendon Reflexes: Reflexes are normal and symmetric.  Psychiatric:        Behavior: Behavior normal.        Thought Content: Thought content normal.         Judgment: Judgment normal.       Assessment & Plan:   James [name] was seen today for medical management of chronic issues, insomnia and gi problem.  Diagnoses and all orders for this visit:  Type 2 diabetes mellitus without complication, without long-term current use of insulin (HCC) -     Bayer DCA Hb A1c Waived  Hyperlipidemia associated with type 2 diabetes mellitus (HCC) -     Lipid panel  Hypertension associated with diabetes (HCC) -     CBC with Differential/Platelet -     CMP14+EGFR  Acquired hypothyroidism -     TSH + free T4  Other orders -     docusate sodium (COLACE) 100 MG capsule; Take 1 capsule (100 mg total) by mouth 2 (two) times daily. To soften stool -     Psyllium 30.9 % POWD; Drink 1 tablespoon dissolved  in water, twice Daily   Allergies as of 02/26/2024       Reactions   Metformin And Related    Loose bloody stools        Medication List        Accurate as of February 26, 2024 11:59 PM. If you have any questions, ask your nurse or doctor.          STOP taking these medications    amoxicillin-clavulanate 875-125 MG tablet Commonly known as: AUGMENTIN Stopped by: Lorell Thibodaux       TAKE these medications    acidophilus Caps capsule Take 1 capsule by mouth daily.   aspirin EC 81 MG tablet Take 81 mg by mouth daily. Swallow whole.   atorvastatin 40 MG tablet Commonly known as: LIPITOR TAKE ONE (1) TABLET EACH DAY   blood glucose meter kit and supplies Dispense based on patient and insurance preference. Use up to four times daily as directed. (FOR ICD-10 E10.9, E11.9).   carvedilol 3.125 MG tablet Commonly known as: COREG TAKE 1 TABLET BY MOUTH TWICE DAILY WITH MEALS   docusate sodium 100 MG capsule Commonly known as: Colace Take 1 capsule (100 mg total) by mouth 2 (two) times daily. To soften stool Started by: Sherree Shankman   fenofibrate 160 MG tablet TAKE ONE (1) TABLET EACH DAY   fluticasone 50 MCG/ACT nasal  spray Commonly known as: FLONASE USE 1 SPRAY IN BOTH NOSTRILS TWICE DAILYAS NEEDED FOR ALLERGIES OR RHINITIS   levothyroxine 50 MCG tablet Commonly known as: SYNTHROID TAKE ONE (1) TABLET EACH DAY   Psyllium 30.9 % Powd Drink 1 tablespoon dissolved in water, twice Daily Started by: James [name]   rivaroxaban 10 MG Tabs tablet Commonly known as: XARELTO Take 1 tablet (10 mg total) by mouth daily.   sildenafil 20 MG tablet Commonly known as: REVATIO Take 2-5 pills at once, orally, with each sexual encounter   testosterone cypionate 200 MG/ML injection Commonly known as: DEPOTESTOSTERONE CYPIONATE INJECT 0.5ML IM EVERY 7 DAYS   valsartan-hydrochlorothiazide 320-25 MG tablet Commonly known as: DIOVAN-HCT TAKE ONE TABLET BY MOUTH EACH DAY FOR BLOOD PRESSURE        Meds ordered this encounter  Medications   docusate sodium (COLACE) 100 MG capsule    Sig: Take 1 capsule (100 mg total) by mouth 2 (two) times daily. To soften stool    Dispense:  60 capsule    Refill:  5   Psyllium 30.9 % POWD    Sig: Drink 1 tablespoon dissolved in water, twice Daily      Follow-up: Return in about 6 months (around 08/28/2024) for [facility]ete physical.  Mechele Claude, M.D.

## 2024-02-27 ENCOUNTER — Inpatient Hospital Stay: Payer: BC Managed Care – PPO | Attending: Hematology | Admitting: Physician Assistant

## 2024-02-27 VITALS — BP 133/80 | HR 79 | Temp 98.2°F | Resp 18 | Wt 253.3 lb

## 2024-02-27 DIAGNOSIS — Z86718 Personal history of other venous thrombosis and embolism: Secondary | ICD-10-CM | POA: Insufficient documentation

## 2024-02-27 DIAGNOSIS — Z79890 Hormone replacement therapy (postmenopausal): Secondary | ICD-10-CM | POA: Diagnosis not present

## 2024-02-27 DIAGNOSIS — Z7901 Long term (current) use of anticoagulants: Secondary | ICD-10-CM | POA: Insufficient documentation

## 2024-02-27 DIAGNOSIS — E610 Copper deficiency: Secondary | ICD-10-CM | POA: Insufficient documentation

## 2024-02-27 DIAGNOSIS — Z79899 Other long term (current) drug therapy: Secondary | ICD-10-CM | POA: Diagnosis not present

## 2024-02-27 DIAGNOSIS — K55069 Acute infarction of intestine, part and extent unspecified: Secondary | ICD-10-CM

## 2024-02-27 DIAGNOSIS — I81 Portal vein thrombosis: Secondary | ICD-10-CM

## 2024-02-27 DIAGNOSIS — D509 Iron deficiency anemia, unspecified: Secondary | ICD-10-CM | POA: Diagnosis not present

## 2024-02-27 DIAGNOSIS — D500 Iron deficiency anemia secondary to blood loss (chronic): Secondary | ICD-10-CM | POA: Diagnosis not present

## 2024-02-27 DIAGNOSIS — E291 Testicular hypofunction: Secondary | ICD-10-CM | POA: Diagnosis not present

## 2024-02-27 DIAGNOSIS — D696 Thrombocytopenia, unspecified: Secondary | ICD-10-CM | POA: Diagnosis not present

## 2024-02-27 DIAGNOSIS — Z7982 Long term (current) use of aspirin: Secondary | ICD-10-CM | POA: Insufficient documentation

## 2024-02-27 LAB — CBC WITH DIFFERENTIAL/PLATELET
Basophils Absolute: 0.1 10*3/uL (ref 0.0–0.2)
Basos: 1 %
EOS (ABSOLUTE): 0.2 10*3/uL (ref 0.0–0.4)
Eos: 3 %
Hematocrit: 45.7 % (ref 37.5–51.0)
Hemoglobin: 15.3 g/dL (ref 13.0–17.7)
Immature Grans (Abs): 0 10*3/uL (ref 0.0–0.1)
Immature Granulocytes: 0 %
Lymphocytes Absolute: 1 10*3/uL (ref 0.7–3.1)
Lymphs: 15 %
MCH: 31.5 pg (ref 26.6–33.0)
MCHC: 33.5 g/dL (ref 31.5–35.7)
MCV: 94 fL (ref 79–97)
Monocytes Absolute: 0.4 10*3/uL (ref 0.1–0.9)
Monocytes: 6 %
Neutrophils Absolute: 4.9 10*3/uL (ref 1.4–7.0)
Neutrophils: 75 %
Platelets: 144 10*3/uL — ABNORMAL LOW (ref 150–450)
RBC: 4.85 x10E6/uL (ref 4.14–5.80)
RDW: 13.2 % (ref 11.6–15.4)
WBC: 6.5 10*3/uL (ref 3.4–10.8)

## 2024-02-27 LAB — LIPID PANEL
Chol/HDL Ratio: 3.8 ratio (ref 0.0–5.0)
Cholesterol, Total: 60 mg/dL — ABNORMAL LOW (ref 100–199)
HDL: 16 mg/dL — ABNORMAL LOW (ref >39)
LDL Chol Calc (NIH): 20 mg/dL (ref 0–99)
Triglycerides: 135 mg/dL (ref 0–149)
VLDL Cholesterol Cal: 24 mg/dL (ref 5–40)

## 2024-02-27 LAB — CMP14+EGFR
ALT: 33 IU/L (ref 0–44)
AST: 35 IU/L (ref 0–40)
Albumin: 4.1 g/dL (ref 3.8–4.9)
Alkaline Phosphatase: 41 IU/L — ABNORMAL LOW (ref 44–121)
BUN/Creatinine Ratio: 9 (ref 9–20)
BUN: 13 mg/dL (ref 6–24)
Bilirubin Total: 1.2 mg/dL (ref 0.0–1.2)
CO2: 23 mmol/L (ref 20–29)
Calcium: 9.9 mg/dL (ref 8.7–10.2)
Chloride: 100 mmol/L (ref 96–106)
Creatinine, Ser: 1.38 mg/dL — ABNORMAL HIGH (ref 0.76–1.27)
Globulin, Total: 2.6 g/dL (ref 1.5–4.5)
Glucose: 130 mg/dL — ABNORMAL HIGH (ref 70–99)
Potassium: 4 mmol/L (ref 3.5–5.2)
Sodium: 136 mmol/L (ref 134–144)
Total Protein: 6.7 g/dL (ref 6.0–8.5)
eGFR: 60 mL/min/{1.73_m2} (ref >59)

## 2024-02-27 LAB — TSH+FREE T4
Free T4: 1.02 ng/dL (ref 0.82–1.77)
TSH: 2.93 u[IU]/mL (ref 0.450–4.500)

## 2024-02-27 NOTE — Patient Instructions (Signed)
 [facility] at [facility] **VISIT SUMMARY & IMPORTANT INSTRUCTIONS **   You were seen today by Rojelio Brenner PA-C for your follow-up visit.    HISTORY OF BLOOD CLOTS Continue to take Xarelto 10 mg daily. You will need to be on a blood thinner indefinitely, since your blood clots were unprovoked.  LOW PLATELETS You have had mildly low platelets, which are stable. Your platelets have remained low even after your copper levels improved. Your CT scan from December 2024 showed an enlarged spleen.  This is likely a result of the blood clot in your liver.  Having an enlarged spleen can cause your platelets to be low, since more of your platelets are "sucked up" by your spleen, the bigger it is.  VITAMIN & MINERAL DEFICIENCIES Continue taking iron tablet (ferrous sulfate 325 mg) every day. Continue taking 2 mg copper supplement daily.  LYMPH NODE You did not have any obvious lymph node swelling on your exam today.  FOLLOW-UP APPOINTMENT: 6 months  ** Thank you for trusting me with your healthcare!  I strive to provide all of my patients with quality care at each visit.  If you receive a survey for this visit, I would be so grateful to you for taking the time to provide feedback.  Thank you in advance!  ~ Jorje Vanatta                   Dr. Doreatha Massed   &   Rojelio Brenner, PA-C   - - - - - - - - - - - - - - - - - -    Thank you for choosing [facility] at [facility] to provide your oncology and hematology care.  To afford each patient quality time with our provider, please arrive at least 15 minutes before your scheduled appointment time.   If you have a lab appointment with the Cancer Center please come in thru the Main Entrance and check in at the main information desk.  You need to re-schedule your appointment should you arrive 10 or more minutes late.  We strive to give you quality time with our providers, and arriving late  affects you and other patients whose appointments are after yours.  Also, if you no show three or more times for appointments you may be dismissed from the clinic at the providers discretion.     Again, thank you for choosing [facility].  Our hope is that these requests will decrease the amount of time that you wait before being seen by our physicians.       _____________________________________________________________  Should you have questions after your visit to [facility], please contact our office at [phone] and follow the prompts.  Our office hours are 8:00 a.m. and 4:30 p.m. Monday - Friday.  Please note that voicemails left after 4:00 p.m. may not be returned until the following business day.  We are closed weekends and major holidays.  You do have access to a nurse 24-7, just call the main number to the clinic [phone] and do not press any options, hold on the line and a nurse will answer the phone.    For prescription refill requests, have your pharmacy contact our office and allow 72 hours.

## 2024-03-01 ENCOUNTER — Encounter: Payer: Self-pay | Admitting: Family Medicine

## 2024-03-01 NOTE — Progress Notes (Signed)
Hello [name],  Your lab result is normal and/or stable.Some minor variations that are not significant are commonly marked abnormal, but do not represent any medical problem for you.  Best regards, Zacari Stiff, M.D.

## 2024-03-24 ENCOUNTER — Other Ambulatory Visit (INDEPENDENT_AMBULATORY_CARE_PROVIDER_SITE_OTHER): Payer: Self-pay | Admitting: Gastroenterology

## 2024-03-24 DIAGNOSIS — I8500 Esophageal varices without bleeding: Secondary | ICD-10-CM

## 2024-03-24 DIAGNOSIS — I81 Portal vein thrombosis: Secondary | ICD-10-CM

## 2024-03-24 DIAGNOSIS — I8510 Secondary esophageal varices without bleeding: Secondary | ICD-10-CM

## 2024-03-24 DIAGNOSIS — I152 Hypertension secondary to endocrine disorders: Secondary | ICD-10-CM

## 2024-03-24 DIAGNOSIS — K580 Irritable bowel syndrome with diarrhea: Secondary | ICD-10-CM

## 2024-03-24 NOTE — Telephone Encounter (Signed)
Last seen 10/02/23.

## 2024-03-31 ENCOUNTER — Telehealth: Payer: Self-pay | Admitting: Gastroenterology

## 2024-03-31 NOTE — Telephone Encounter (Signed)
 Patient left a message he needs to reschedule his appt scheduled for 4/15 with [facility].

## 2024-03-31 NOTE — Telephone Encounter (Signed)
 Pt rescheduled

## 2024-04-01 ENCOUNTER — Ambulatory Visit (INDEPENDENT_AMBULATORY_CARE_PROVIDER_SITE_OTHER): Payer: BC Managed Care – PPO | Admitting: Gastroenterology

## 2024-04-24 ENCOUNTER — Other Ambulatory Visit (INDEPENDENT_AMBULATORY_CARE_PROVIDER_SITE_OTHER): Payer: Self-pay | Admitting: Gastroenterology

## 2024-04-24 ENCOUNTER — Other Ambulatory Visit: Payer: Self-pay | Admitting: Family Medicine

## 2024-04-24 DIAGNOSIS — K580 Irritable bowel syndrome with diarrhea: Secondary | ICD-10-CM

## 2024-04-24 DIAGNOSIS — I81 Portal vein thrombosis: Secondary | ICD-10-CM

## 2024-04-24 DIAGNOSIS — E1159 Type 2 diabetes mellitus with other circulatory complications: Secondary | ICD-10-CM

## 2024-04-24 DIAGNOSIS — I8510 Secondary esophageal varices without bleeding: Secondary | ICD-10-CM

## 2024-04-24 DIAGNOSIS — I8500 Esophageal varices without bleeding: Secondary | ICD-10-CM

## 2024-05-02 ENCOUNTER — Other Ambulatory Visit: Payer: Self-pay | Admitting: Family Medicine

## 2024-05-02 DIAGNOSIS — E1169 Type 2 diabetes mellitus with other specified complication: Secondary | ICD-10-CM

## 2024-05-08 DIAGNOSIS — F424 Excoriation (skin-picking) disorder: Secondary | ICD-10-CM | POA: Diagnosis not present

## 2024-05-08 DIAGNOSIS — B078 Other viral warts: Secondary | ICD-10-CM | POA: Diagnosis not present

## 2024-05-08 DIAGNOSIS — Z08 Encounter for follow-up examination after completed treatment for malignant neoplasm: Secondary | ICD-10-CM | POA: Diagnosis not present

## 2024-05-08 DIAGNOSIS — Z85828 Personal history of other malignant neoplasm of skin: Secondary | ICD-10-CM | POA: Diagnosis not present

## 2024-05-22 ENCOUNTER — Ambulatory Visit: Admitting: Family Medicine

## 2024-05-22 ENCOUNTER — Encounter: Payer: Self-pay | Admitting: Family Medicine

## 2024-05-22 VITALS — BP 137/73 | HR 90 | Temp 98.0°F | Ht 73.0 in | Wt 242.0 lb

## 2024-05-22 DIAGNOSIS — J0100 Acute maxillary sinusitis, unspecified: Secondary | ICD-10-CM

## 2024-05-22 DIAGNOSIS — R6889 Other general symptoms and signs: Secondary | ICD-10-CM

## 2024-05-22 MED ORDER — PSEUDOEPHEDRINE-GUAIFENESIN ER 120-1200 MG PO TB12: 1.0000 | ORAL_TABLET | Freq: Two times a day (BID) | ORAL | 0 refills | Status: DC

## 2024-05-22 MED ORDER — AMOXICILLIN-POT CLAVULANATE 875-125 MG PO TABS: 1.0000 | ORAL_TABLET | Freq: Two times a day (BID) | ORAL | 0 refills | Status: DC

## 2024-05-22 NOTE — Progress Notes (Signed)
 Chief Complaint  Patient presents with   Sinus Problem    Started yesterday. Sinus drainage and pressure. Runny nose and sore throat. Pt is prone to sinus infection and mucous is turning green.     HPI  Patient presents today for Symptoms include congestion, facial pain, nasal congestion, non productive cough, post nasal drip and sinus pressure. There is no fever, chills, or sweats. Onset of symptoms was YESTERDAY, gradually worsening over night.    PMH: Smoking status noted Review of Systems  Objective: BP 137/73   Pulse 90   Temp 98 F (36.7 C)   Ht 6\' 1"  (1.854 m)   Wt 242 lb (109.8 kg)   SpO2 96%   BMI 31.93 kg/m  Gen: NAD, alert, cooperative with exam HEENT: NCAT, EOMI, PERRL CV: RRR, good S1/S2, no murmur Resp: CTABL, no wheezes, non-labored Abd: SNTND, BS present, no guarding or organomegaly Ext: No edema, warm Neuro: Alert and oriented, No gross deficits  Acute maxillary sinusitis, recurrence not specified  Flu-like symptoms -     COVID-19, Flu A+B and RSV  Other orders -     Amoxicillin -Pot Clavulanate; Take 1 tablet by mouth 2 (two) times daily. Take all of this medication  Dispense: 20 tablet; Refill: 0 -     Pseudoephedrine-guaiFENesin ER; Take 1 tablet by mouth 2 (two) times daily. For congestion  Dispense: 14 tablet; Refill: 0

## 2024-05-23 LAB — COVID-19, FLU A+B AND RSV
Influenza A, NAA: NOT DETECTED
Influenza B, NAA: NOT DETECTED
RSV, NAA: NOT DETECTED
SARS-CoV-2, NAA: NOT DETECTED

## 2024-05-24 ENCOUNTER — Ambulatory Visit: Payer: Self-pay | Admitting: Family Medicine

## 2024-06-02 ENCOUNTER — Ambulatory Visit (INDEPENDENT_AMBULATORY_CARE_PROVIDER_SITE_OTHER): Admitting: Gastroenterology

## 2024-06-03 ENCOUNTER — Encounter (INDEPENDENT_AMBULATORY_CARE_PROVIDER_SITE_OTHER): Payer: Self-pay | Admitting: Gastroenterology

## 2024-06-03 ENCOUNTER — Ambulatory Visit (INDEPENDENT_AMBULATORY_CARE_PROVIDER_SITE_OTHER): Admitting: Gastroenterology

## 2024-06-03 VITALS — BP 112/71 | HR 84 | Temp 98.0°F | Ht 73.0 in | Wt 239.1 lb

## 2024-06-03 DIAGNOSIS — I8510 Secondary esophageal varices without bleeding: Secondary | ICD-10-CM | POA: Diagnosis not present

## 2024-06-03 DIAGNOSIS — K580 Irritable bowel syndrome with diarrhea: Secondary | ICD-10-CM

## 2024-06-03 DIAGNOSIS — Z86718 Personal history of other venous thrombosis and embolism: Secondary | ICD-10-CM

## 2024-06-03 MED ORDER — DICYCLOMINE HCL 10 MG PO CAPS: 10.0000 mg | ORAL_CAPSULE | Freq: Three times a day (TID) | ORAL | 1 refills | Status: AC | PRN

## 2024-06-03 NOTE — Progress Notes (Addendum)
 Referring Provider: Roise Cleaver, MD Primary Care Physician:  Roise Cleaver, MD Primary GI Physician: Dr. Sammi Crick   Chief Complaint  Patient presents with   Follow-up    Pt arrives for follow up. Pt states he has been gassy. Pt is not taking anything for gas at this time. Pt states everything else is going ok. BM sometimes loose, if he starts to get constipated he will take Miralax. Wife states pt has some issues with making it to the bathroom.    HPI:   Marc JARION HAWTHORNE is a 58 y.o. 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 diff   Patient presenting today for:  Follow up of IBS and Esophageal varices secondary to   Last seen October 2024, at that time, continued to have lower abdominal pain, worse with certain foods, improved with BM. Darker stools on iron pills. Taking metamucil 4-5 times per week, 5-6 stools per day.  Continued on coreg  for EVs, did not tolerate higher dose in the past  Recommended increase metamucil to 1T BID or benefiber 1T 1-2x/day, increase water , bentyl 10mg  BID PRN, schedule colonoscopy in December, low FODMAP guide  Present:  States BMs are occurring frequently, 3-4 stools per day with looser to watery stools. He does endorse taking a stool softener as needed, though often takes these daily. He notes that stools firm up when he stops stool softener. He is taking metamucil 1t daily. having more gas recently, he feels that it gets better when he moves around some. No real changes in his diet. He only has occasional bloating which seems to be relieved with metamucil or miralax.  States that he never got bentyl as this was never sent to his pharmacy.  Has some occasional abdominal discomfort at times. He did try the low FODMAP  guide last time which he felt helped some. He notes he can eat [employer] but if he goes to a regular [place] he has to rush home to use the restroom as this will give him diarrhea.   No red flag symptoms. Patient denies melena, hematochezia, nausea, vomiting, constipation, dysphagia, odyonophagia, early satiety or weight loss.    Last Colonoscopy:12/2023- Five 2 to 8 mm polyps in the transverse colon, in                            the ascending colon and in the cecum, removed with                            a cold snare. Resected and retrieved.                           - Diverticulosis in the sigmoid colon, in the                            descending colon and in the transverse colon.                           - Non-bleeding internal hemorrhoids.                           -  Anal papilla(e) were hypertrophied. . COLON, ASCENDING, TRANSVERSE, POLYPECTOMY:       Fragments of tubular adenoma.       Negative for high-grade dysplasia.  CT Angio A/P w wo: 12/09/23: VASCULAR Chronic portal vein occlusion with cavernous transformation of the portal vein as seen on prior study from 2022. Otherwise no evidence of arterial or venous occlusion/thrombosis. Scattered aortoiliac atherosclerosis.   NON-VASCULAR Left colonic diverticulosis. Haziness around the proximal descending colon may reflect early active diverticulitis. Splenomegaly, stable since prior study.   CT A/P with contrast: Oct 2022 interval development of cavernous transformation of the main portal vein secondary to thrombosis noted on prior CT scan of June 13, 2021 -Embolization coils noted within right hepatic lobe which was not identified on prior exam. -Large amount of stool seen in the sigmoid colon and rectum concerning for rectal impaction. -Sigmoid diverticulosis is noted without definite evidence of acute inflammation. -Mild broad necked fat containing periumbilical hernia is  abdominal wall construction for  incisional hernia with mesh placement, Dr. Hershell Lose on 11/23/2021.    Last Endoscopy:01/2023  - Grade I esophageal varices.                           - 1 cm hiatal hernia.                           - Portal hypertensive gastropathy.                           - Normal examined duodenum.                           - No specimens collected.   Recommendations:  Repeat colonoscopy 11/2027 Repeat EGD feb 2026  Past Medical History:  Diagnosis Date   Arthritis    Diverticula of colon    Hx of exploratory laparotomy    enterectomy small intest. resection,celiotomy   Hyperlipidemia    Hypertension    Hypothyroidism    Pre-diabetes    Sleep apnea     Past Surgical History:  Procedure Laterality Date   BACK SURGERY     BIOPSY  11/22/2021   Procedure: BIOPSY;  Surgeon: Urban Garden, MD;  [facility]: [facility];  Service: Gastroenterology;;   COLONOSCOPY WITH PROPOFOL  N/A 11/22/2021   Procedure: COLONOSCOPY WITH PROPOFOL ;  Surgeon: Urban Garden, MD;  Location: [facility];  Service: Gastroenterology;  Laterality: N/A;  8:45   COLONOSCOPY WITH PROPOFOL  N/A 12/25/2023   Procedure: COLONOSCOPY WITH PROPOFOL ;  Surgeon: Urban Garden, MD;  [facility]: [facility];  Service: Gastroenterology;  Laterality: N/A;  8:15am;asa 1   ESOPHAGOGASTRODUODENOSCOPY (EGD) WITH PROPOFOL  N/A 11/22/2021   Procedure: ESOPHAGOGASTRODUODENOSCOPY (EGD) WITH PROPOFOL ;  Surgeon: Urban Garden, MD;  [facility];  Service: Gastroenterology;  Laterality: N/A;   ESOPHAGOGASTRODUODENOSCOPY (EGD) WITH PROPOFOL  N/A 02/06/2023   Procedure: ESOPHAGOGASTRODUODENOSCOPY (EGD) WITH PROPOFOL ;  Surgeon: Urban Garden, MD;  [facility];  Service: Gastroenterology;  Laterality: N/A;  2:15 pm   INCISIONAL HERNIA REPAIR N/A 11/23/2021   Procedure: OPEN COMPONENT SEPARATION AND MESH ABDOMINAL WALL RECONSTRUCTION FOR INCISIONAL HERNIA REPAIR, LYSIS OF ADHESIONS AND  TRANSABDOMINAL PLANE BLOCK;  Surgeon: Candyce Champagne, MD;  Location: [facility];  Service: General;  Laterality: N/A;   POLYPECTOMY  11/22/2021   Procedure: POLYPECTOMY;  Surgeon: Umberto Ganong, Bearl Limes, MD;  [facility]: [facility];  Service: Gastroenterology;;   POLYPECTOMY  12/25/2023   Procedure: POLYPECTOMY;  Surgeon: Umberto Ganong, Bearl Limes, MD;  Location: [facility];  Service: Gastroenterology;;   TONSILLECTOMY      Current Outpatient Medications  Medication Sig Dispense Refill   acidophilus (RISAQUAD) CAPS capsule Take 1 capsule by mouth daily.     amoxicillin -clavulanate (AUGMENTIN ) 875-125 MG tablet Take 1 tablet by mouth 2 (two) times daily. Take all of this medication 20 tablet 0   aspirin  EC 81 MG tablet Take 81 mg by mouth daily. Swallow whole.     blood glucose meter kit and supplies Dispense based on patient and insurance preference. Use up to four times daily as directed. (FOR ICD-10 E10.9, E11.9). 1 each 0   carvedilol  (COREG ) 3.125 MG tablet TAKE 1 TABLET BY MOUTH TWICE DAILY WITH MEALS 60 tablet 1   dicyclomine (BENTYL) 10 MG capsule Take 1 capsule (10 mg total) by mouth 3 (three) times daily as needed for spasms. 90 capsule 1   docusate sodium  (COLACE) 100 MG capsule Take 1 capsule (100 mg total) by mouth 2 (two) times daily. To soften stool 60 capsule 5   fenofibrate  160 MG tablet TAKE ONE (1) TABLET EACH DAY 90 tablet 1   fluticasone  (FLONASE ) 50 MCG/ACT nasal spray USE 1 SPRAY IN BOTH NOSTRILS TWICE DAILYAS NEEDED FOR ALLERGIES OR RHINITIS 16 g 5   levothyroxine  (SYNTHROID ) 50 MCG tablet TAKE ONE (1) TABLET EACH DAY 90 tablet 3   Pseudoephedrine -Guaifenesin  [id number] MG TB12 Take 1 tablet by mouth 2 (two) times daily. For congestion 14 tablet 0   Psyllium 30.9 % POWD Drink 1 tablespoon dissolved in water , twice Daily     rivaroxaban  (XARELTO ) 10 MG TABS tablet Take 1 tablet (10 mg total) by mouth daily. 30 tablet 0   sildenafil  (REVATIO ) 20 MG tablet Take 2-5 pills at  once, orally, with each sexual encounter 50 tablet 5   testosterone  cypionate (DEPOTESTOSTERONE CYPIONATE) 200 MG/ML injection INJECT 0.5ML IM EVERY 7 DAYS 10 mL 0   valsartan -hydrochlorothiazide  (DIOVAN -HCT) 320-25 MG tablet TAKE ONE TABLET BY MOUTH EACH DAY FOR BLOOD PRESSURE 90 tablet 1   No current facility-administered medications for this visit.    Allergies as of 06/03/2024 - Review Complete 06/03/2024  Allergen Reaction Noted   Metformin  and related  05/04/2021    Social History   Socioeconomic History   Marital status: Married    Spouse name: Not on file   Number of children: Not on file   Years of education: Not on file   Highest education level: 12th grade  Occupational History   Not on file  Tobacco Use   Smoking status: Never    Passive exposure: Never   Smokeless tobacco: Never  Vaping Use   Vaping status: Never Used  Substance and Sexual Activity   Alcohol use: No    Comment: rare   Drug use: No   Sexual activity: Yes    Birth control/protection: None  Other Topics Concern   Not on file  Social History Narrative   He is currently a policie officer.    Social Drivers of Health   Financial Resource Strain: High Risk (02/25/2024)   Overall Financial Resource Strain (CARDIA)    Difficulty of Paying Living Expenses: Hard  Food Insecurity: Patient Declined (02/25/2024)   Hunger Vital Sign    Worried About Running Out of Food in the Last Year: Patient declined  Ran Out of Food in the Last Year: Patient declined  Transportation Needs: No Transportation Needs (02/25/2024)   PRAPARE - Administrator, Civil Service (Medical): No    Lack of Transportation (Non-Medical): No  Physical Activity: Sufficiently Active (02/25/2024)   Exercise Vital Sign    Days of Exercise per Week: 4 days    Minutes of Exercise per Session: 60 min  Stress: Stress Concern Present (02/25/2024)   [employer] of Occupational Health - Occupational Stress Questionnaire     Feeling of Stress : To some extent  Social Connections: Unknown (02/25/2024)   Social Connection and Isolation Panel    Frequency of Communication with Friends and Family: More than three times a week    Frequency of Social Gatherings with Friends and Family: Twice a week    Attends Religious Services: Patient declined    Database administrator or Organizations: Patient declined    Attends Engineer, structural: Not on file    Marital Status: Married    Review of systems General: negative for malaise, night sweats, fever, chills, weight los Neck: Negative for lumps, goiter, pain and significant neck swelling Resp: Negative for cough, wheezing, dyspnea at rest CV: Negative for chest pain, leg swelling, palpitations, orthopnea GI: denies melena, hematochezia, nausea, vomiting, diarrhea, constipation, dysphagia, odyonophagia, early satiety or unintentional weight loss.  MSK: Negative for joint pain or swelling, back pain, and muscle pain. Derm: Negative for itching or rash Psych: Denies depression, anxiety, memory loss, confusion. No homicidal or suicidal ideation.  Heme: Negative for prolonged bleeding, bruising easily, and swollen nodes. Endocrine: Negative for cold or heat intolerance, polyuria, polydipsia and goiter. Neuro: negative for tremor, gait imbalance, syncope and seizures. The remainder of the review of systems is noncontributory.  Physical Exam: BP 112/71   Pulse 84   Temp 98 F (36.7 C)   Ht 6' 1 (1.854 m)   Wt 239 lb 1.6 oz (108.5 kg)   BMI 31.55 kg/m  General:   Alert and oriented. No distress noted. Pleasant and cooperative.  Head:  Normocephalic and atraumatic. Eyes:  Conjuctiva clear without scleral icterus. Mouth:  Oral mucosa pink and moist. Good dentition. No lesions. Heart: Normal rate and rhythm, s1 and s2 heart sounds present.  Lungs: Clear lung sounds in all lobes. Respirations equal and unlabored. Abdomen:  +BS, soft, non-tender and  non-distended. No rebound or guarding. No HSM or masses noted. Derm: No palmar erythema or jaundice Msk:  Symmetrical without gross deformities. Normal posture. Extremities:  Without edema. Neurologic:  Alert and  oriented x4 Psych:  Alert and cooperative. Normal mood and affect.  Invalid input(s): 6 MONTHS   ASSESSMENT: Coleton KAYON DOZIER is a 58 y.o. male presenting today for follow up of IBS and Esophageal varices  ZOX:WRUE abdominal cramping, looser stools, fecal urgency at times. Noted some improvement with low FODMAP guide. He is taking a stool softener almost daily and endorses stools firm up if he stops this. Would recommend trying bentyl 10mg  BID PRN, continue low FODMAP guide, metamucil and may benefit from stopping stool softener given looser stools on this.   EVs: :secondary to cavernomatous degeneration of portal vein. grade 1 EVs on most recent EGD which will need to be repeated in 2026. He is on coreg  3.125mg  BID, HR today was 84, not within goal of <60, however, he did not tolerate 6.25mg  BID dosing before and had orthostatic hypotension, therefore, will need to continue with current dose as he  is tolerating well.   History of splenic and mesenteric thrombosis: following with hematology, remains on chronic ACs. CTA A/P with and wo in December with Chronic portal vein occlusion with cavernous transformation of the portal vein as seen on prior study from 2022. Otherwise no evidence of arterial or venous occlusion/thrombosis.   PLAN:  -Continue to utlilize low FODMAP guide to help avoid trigger foods -bentyl 10mg  TID PRN -If having looser stools, hold off on stool softener and continue metamucil -can try over the counter IBgard or Iberogast to help with gas/bloating -Continue coreg  3.125mg  twice daily  All questions were answered, patient verbalized understanding and is in agreement with plan as outlined above.   Follow Up: 6 months   Jasnoor Trussell L. Adrien Alberta, MSN, APRN,  AGNP-C Adult-Gerontology Nurse Practitioner [facility]  I have reviewed the note and agree with the APP's assessment as described in this progress note  Samantha Cress, MD Gastroenterology and Hepatology [facility]

## 2024-06-03 NOTE — Patient Instructions (Addendum)
 Continue to utlilize low FODMAP guide to help avoid trigger foods I have sent bentyl to try up to three times per day for abdominal pain/cramping and looser stools If you are having looser stools, I would hold off on stool softener and continue metamucil You can try over the counter IBgard or Iberogast to help with gas/bloating Continue coreg  3.125mg  twice daily  Follow up 6 months  It was a pleasure to see you today. I want to create trusting relationships with patients and provide genuine, compassionate, and quality care. I truly value your feedback! please be on the lookout for a survey regarding your visit with me today. I appreciate your input about our visit and your time in completing this!    Dejanay Wamboldt L. Rei Medlen, MSN, APRN, AGNP-C Adult-Gerontology Nurse Practitioner [facility]

## 2024-06-15 ENCOUNTER — Other Ambulatory Visit: Payer: Self-pay | Admitting: Physician Assistant

## 2024-06-15 DIAGNOSIS — K55069 Acute infarction of intestine, part and extent unspecified: Secondary | ICD-10-CM

## 2024-06-26 ENCOUNTER — Other Ambulatory Visit (INDEPENDENT_AMBULATORY_CARE_PROVIDER_SITE_OTHER): Payer: Self-pay | Admitting: Gastroenterology

## 2024-06-26 DIAGNOSIS — I8510 Secondary esophageal varices without bleeding: Secondary | ICD-10-CM

## 2024-06-26 DIAGNOSIS — E1159 Type 2 diabetes mellitus with other circulatory complications: Secondary | ICD-10-CM

## 2024-06-26 DIAGNOSIS — K580 Irritable bowel syndrome with diarrhea: Secondary | ICD-10-CM

## 2024-06-26 DIAGNOSIS — I8500 Esophageal varices without bleeding: Secondary | ICD-10-CM

## 2024-06-26 DIAGNOSIS — I81 Portal vein thrombosis: Secondary | ICD-10-CM

## 2024-07-07 ENCOUNTER — Ambulatory Visit: Admitting: Nurse Practitioner

## 2024-07-07 ENCOUNTER — Encounter: Payer: Self-pay | Admitting: Nurse Practitioner

## 2024-07-07 VITALS — BP 125/70 | HR 99 | Temp 98.1°F | Ht 73.0 in | Wt 233.2 lb

## 2024-07-07 DIAGNOSIS — H66002 Acute suppurative otitis media without spontaneous rupture of ear drum, left ear: Secondary | ICD-10-CM | POA: Diagnosis not present

## 2024-07-07 DIAGNOSIS — R051 Acute cough: Secondary | ICD-10-CM | POA: Diagnosis not present

## 2024-07-07 DIAGNOSIS — J0101 Acute recurrent maxillary sinusitis: Secondary | ICD-10-CM | POA: Insufficient documentation

## 2024-07-07 MED ORDER — CEFDINIR 300 MG PO CAPS: 300.0000 mg | ORAL_CAPSULE | Freq: Two times a day (BID) | ORAL | 0 refills | Status: DC

## 2024-07-07 MED ORDER — GUAIFENESIN 400 MG PO TABS: 400.0000 mg | ORAL_TABLET | Freq: Four times a day (QID) | ORAL | 0 refills | Status: DC | PRN

## 2024-07-07 NOTE — Progress Notes (Signed)
 Acute Office Visit  Subjective:     Patient ID: James [name], male    DOB: [birth year], 58 y.o.   MRN: [id number]  Chief Complaint  Patient presents with   Cough    Symptoms for couple days. Had this before but came back. started having chest pain couple days ago from coughing    Nasal Congestion   James [name] is a 58 y.o. male presents 07/07/2024 who complains of congestion, nasal blockage, and dry cough for over 61-month Cough been here since I was here last time for sinus infection in June, cough had been lingering and sinus infection is back . He was seen by Stacks MD and prescribed amoxicillin  and Guaifenesin  . He denies a history of chest pain, chills, fevers, nausea, and vomiting and denies a history of asthma. Patient denies smoke cigarettes. Cough Associated symptoms include ear pain. Pertinent negatives include no chest pain, chills, fever, headaches, rash or sore throat.   Active Ambulatory Problems    Diagnosis Date Noted   Acquired hypothyroidism 03/01/2018   Allergy to pollen 06/12/2018   Basal cell carcinoma 05/25/2014   Hypogonadism in male 08/31/2015   Organic impotence 05/25/2014   Obstructive sleep apnea 04/01/2015   Secondary polycythemia 05/27/2014   ETD (Eustachian tube dysfunction), left 08/26/2018   Abnormal kidney function 10/01/2019   Morbid obesity (HCC) 10/01/2019   Type 2 diabetes mellitus without complication, without long-term current use of insulin (HCC) 10/03/2019   Hypertension associated with diabetes (HCC) 10/17/2019   Hyperlipidemia associated with type 2 diabetes mellitus (HCC) 10/17/2019   Mesenteric thrombosis (HCC) 11/02/2021   Incisional hernia 11/02/2021   History of diverticulitis 11/02/2021   Portal vein thrombosis 11/02/2021   Portal hypertensive gastropathy (HCC) 11/23/2021   Esophageal varices without bleeding (HCC) 03/02/2022   Irritable bowel syndrome with diarrhea 03/02/2022   History of colonic polyps 12/25/2023    Acute cough 07/07/2024   Non-recurrent acute suppurative otitis media of left ear without spontaneous rupture of tympanic membrane 07/07/2024   Acute recurrent maxillary sinusitis 07/07/2024   Resolved Ambulatory Problems    Diagnosis Date Noted   Benign essential hypertension 05/25/2014   Prediabetes 04/20/2015   Pure hypertriglyceridemia 03/01/2018   BMI 37.0-37.9, adult 06/05/2019   Elevated glucose 10/01/2019   Past Medical History:  Diagnosis Date   Arthritis    Diverticula of colon    Hx of exploratory laparotomy    Hyperlipidemia    Hypertension    Hypothyroidism    Pre-diabetes    Sleep apnea     Review of Systems  Constitutional:  Negative for chills and fever.  HENT:  Positive for congestion, ear pain and sinus pain. Negative for sore throat.   Respiratory:  Positive for cough.        Non productive  Cardiovascular:  Negative for chest pain and leg swelling.  Skin:  Negative for rash.  Neurological:  Negative for dizziness and headaches.   Negative unless indicated in HPI    Objective:    BP 125/70   Pulse 99   Temp 98.1 F (36.7 C) (Temporal)   Ht 6' 1 (1.854 m)   Wt 233 lb 3.2 oz (105.8 kg)   SpO2 98%   BMI 30.77 kg/m  BP Readings from Last 3 Encounters:  07/07/24 125/70  06/03/24 112/71  05/22/24 137/73   Wt Readings from Last 3 Encounters:  07/07/24 233 lb 3.2 oz (105.8 kg)  06/03/24 239 lb 1.6 oz (108.5 kg)  05/22/24  242 lb (109.8 kg)      Physical Exam Vitals and nursing note reviewed.  Constitutional:      General: He is not in acute distress. HENT:     Head: Normocephalic and atraumatic.     Right Ear: Hearing, tympanic membrane, ear canal and external ear normal.     Left Ear: Swelling present. Tympanic membrane is erythematous.     Nose: Congestion present.     Right Sinus: Maxillary sinus tenderness present.     Left Sinus: Maxillary sinus tenderness present.  Eyes:     General: No scleral icterus.    Extraocular Movements:  Extraocular movements intact.     Conjunctiva/sclera: Conjunctivae normal.     Pupils: Pupils are equal, round, and reactive to light.  Cardiovascular:     Rate and Rhythm: Normal rate and regular rhythm.     Heart sounds: Normal heart sounds.  Pulmonary:     Effort: Pulmonary effort is normal.     Breath sounds: Normal breath sounds. No wheezing or rales.  Musculoskeletal:        General: Normal range of motion.     Cervical back: Normal range of motion and neck supple. No rigidity or tenderness.     Right lower leg: No edema.     Left lower leg: No edema.  Lymphadenopathy:     Cervical: No cervical adenopathy.  Skin:    General: Skin is warm and dry.     Capillary Refill: Capillary refill takes less than 2 seconds.  Neurological:     Mental Status: He is alert and oriented to person, place, and time.  Psychiatric:        Mood and Affect: Mood normal.        Behavior: Behavior normal.        Judgment: Judgment normal.     No results found for any visits on 07/07/24.      Assessment & Plan:  Acute cough -     guaiFENesin ; Take 1 tablet (400 mg total) by mouth every 6 (six) hours as needed.  Dispense: 30 tablet; Refill: 0  Non-recurrent acute suppurative otitis media of left ear without spontaneous rupture of tympanic membrane -     Cefdinir ; Take 1 capsule (300 mg total) by mouth 2 (two) times daily.  Dispense: 14 capsule; Refill: 0 -     guaiFENesin ; Take 1 tablet (400 mg total) by mouth every 6 (six) hours as needed.  Dispense: 30 tablet; Refill: 0  Acute recurrent maxillary sinusitis -     Cefdinir ; Take 1 capsule (300 mg total) by mouth 2 (two) times daily.  Dispense: 14 capsule; Refill: 0 -     guaiFENesin ; Take 1 tablet (400 mg total) by mouth every 6 (six) hours as needed.  Dispense: 30 tablet; Refill: 0   And is a 58 year old Caucasian male seen today for sinusitis and otitis media, no acute distress otitis media and sinusitis Since he was treated with Augmentin   a month ago will cover with cefdinir  300 mg twice daily for 7 days; guaifenesin  400 mg 1 tablet every 6 hours for cough advised to take each tablet with a full glass of water  - Get over-the-counter Zyrtec Symptomatic therapy suggested: push fluids, rest, and return office visit prn if symptoms persist or worsen.  Call or return to clinic prn if these symptoms worsen or fail to improve as anticipated.  Return if symptoms worsen or fail to improve.  Emelie Newsom [place], [place]  [facility] [address] [place], [place] [address] [phone]  Note: This document was prepared by [employer] voice dictation technology and any errors that results from this process are unintentional.

## 2024-07-22 ENCOUNTER — Telehealth: Payer: Self-pay

## 2024-07-22 DIAGNOSIS — G4733 Obstructive sleep apnea (adult) (pediatric): Secondary | ICD-10-CM

## 2024-07-22 NOTE — Telephone Encounter (Signed)
 Okay to reorder CPAP masks?

## 2024-07-22 NOTE — Telephone Encounter (Signed)
 Copied from CRM [id number]. Topic: Clinical - Order For Equipment >> Jul 22, 2024  1:23 PM Elle L wrote: Reason for CRM: The patient states that [facility] in [place] did not receive his prescription for his CPAP masks. The patient's call back number is [phone].

## 2024-07-22 NOTE — Telephone Encounter (Signed)
 Yes please

## 2024-07-23 NOTE — Addendum Note (Signed)
 Addended by: JODENE AMI NOVAK on: 07/23/2024 04:18 PM   Modules accepted: Orders

## 2024-07-23 NOTE — Telephone Encounter (Signed)
 ORDER FAXED TO [facility] PER PATIENT REQUEST

## 2024-07-24 ENCOUNTER — Other Ambulatory Visit: Payer: Self-pay | Admitting: Family Medicine

## 2024-07-28 ENCOUNTER — Other Ambulatory Visit: Payer: Self-pay | Admitting: Family Medicine

## 2024-07-28 ENCOUNTER — Telehealth: Payer: Self-pay | Admitting: Family Medicine

## 2024-07-28 NOTE — Telephone Encounter (Signed)
 Contacted patient. Notified patient. Patient verbalized understanding.

## 2024-07-28 NOTE — Telephone Encounter (Signed)
 Copied from CRM [id number]. Topic: Clinical - Medication Question >> Jul 28, 2024  2:16 PM Tobias L wrote: Reason for CRM: Patient following up on request for testosterone  refill. Advised pending and awaiting response from provider.  Patient requesting this be sent in

## 2024-07-28 NOTE — Telephone Encounter (Signed)
 Please let the patient know that I sent their prescription to their pharmacy. Thanks, WS

## 2024-08-27 ENCOUNTER — Inpatient Hospital Stay: Attending: Physician Assistant

## 2024-08-27 DIAGNOSIS — E291 Testicular hypofunction: Secondary | ICD-10-CM | POA: Insufficient documentation

## 2024-08-27 DIAGNOSIS — K55069 Acute infarction of intestine, part and extent unspecified: Secondary | ICD-10-CM

## 2024-08-27 DIAGNOSIS — D500 Iron deficiency anemia secondary to blood loss (chronic): Secondary | ICD-10-CM

## 2024-08-27 DIAGNOSIS — E610 Copper deficiency: Secondary | ICD-10-CM | POA: Diagnosis not present

## 2024-08-27 DIAGNOSIS — Z7982 Long term (current) use of aspirin: Secondary | ICD-10-CM | POA: Insufficient documentation

## 2024-08-27 DIAGNOSIS — Z7901 Long term (current) use of anticoagulants: Secondary | ICD-10-CM | POA: Diagnosis not present

## 2024-08-27 DIAGNOSIS — Z800 Family history of malignant neoplasm of digestive organs: Secondary | ICD-10-CM | POA: Insufficient documentation

## 2024-08-27 DIAGNOSIS — D509 Iron deficiency anemia, unspecified: Secondary | ICD-10-CM | POA: Diagnosis not present

## 2024-08-27 DIAGNOSIS — Z79890 Hormone replacement therapy (postmenopausal): Secondary | ICD-10-CM | POA: Insufficient documentation

## 2024-08-27 DIAGNOSIS — D696 Thrombocytopenia, unspecified: Secondary | ICD-10-CM | POA: Insufficient documentation

## 2024-08-27 DIAGNOSIS — Z86718 Personal history of other venous thrombosis and embolism: Secondary | ICD-10-CM | POA: Diagnosis not present

## 2024-08-27 DIAGNOSIS — Z79899 Other long term (current) drug therapy: Secondary | ICD-10-CM | POA: Diagnosis not present

## 2024-08-27 DIAGNOSIS — I81 Portal vein thrombosis: Secondary | ICD-10-CM

## 2024-08-27 DIAGNOSIS — K589 Irritable bowel syndrome without diarrhea: Secondary | ICD-10-CM | POA: Diagnosis not present

## 2024-08-27 LAB — COMPREHENSIVE METABOLIC PANEL WITH GFR
ALT: 33 U/L (ref 0–44)
AST: 29 U/L (ref 15–41)
Albumin: 4.1 g/dL (ref 3.5–5.0)
Alkaline Phosphatase: 29 U/L — ABNORMAL LOW (ref 38–126)
Anion gap: 13 (ref 5–15)
BUN: 21 mg/dL — ABNORMAL HIGH (ref 6–20)
CO2: 25 mmol/L (ref 22–32)
Calcium: 9.5 mg/dL (ref 8.9–10.3)
Chloride: 100 mmol/L (ref 98–111)
Creatinine, Ser: 1.52 mg/dL — ABNORMAL HIGH (ref 0.61–1.24)
GFR, Estimated: 53 mL/min — ABNORMAL LOW (ref >60)
Glucose, Bld: 95 mg/dL (ref 70–99)
Potassium: 4.3 mmol/L (ref 3.5–5.1)
Sodium: 138 mmol/L (ref 135–145)
Total Bilirubin: 1.5 mg/dL — ABNORMAL HIGH (ref 0.0–1.2)
Total Protein: 7.1 g/dL (ref 6.5–8.1)

## 2024-08-27 LAB — CBC WITH DIFFERENTIAL/PLATELET
Abs Immature Granulocytes: 0.01 K/uL (ref 0.00–0.07)
Basophils Absolute: 0.1 K/uL (ref 0.0–0.1)
Basophils Relative: 1 %
Eosinophils Absolute: 0.3 K/uL (ref 0.0–0.5)
Eosinophils Relative: 5 %
HCT: 48.3 % (ref 39.0–52.0)
Hemoglobin: 16.4 g/dL (ref 13.0–17.0)
Immature Granulocytes: 0 %
Lymphocytes Relative: 14 %
Lymphs Abs: 0.8 K/uL (ref 0.7–4.0)
MCH: 31.8 pg (ref 26.0–34.0)
MCHC: 34 g/dL (ref 30.0–36.0)
MCV: 93.8 fL (ref 80.0–100.0)
Monocytes Absolute: 0.5 K/uL (ref 0.1–1.0)
Monocytes Relative: 8 %
Neutro Abs: 4.3 K/uL (ref 1.7–7.7)
Neutrophils Relative %: 72 %
Platelets: 102 K/uL — ABNORMAL LOW (ref 150–400)
RBC: 5.15 MIL/uL (ref 4.22–5.81)
RDW: 14.1 % (ref 11.5–15.5)
WBC: 5.9 K/uL (ref 4.0–10.5)
nRBC: 0 % (ref 0.0–0.2)

## 2024-08-27 LAB — IRON AND TIBC
Iron: 102 ug/dL (ref 45–182)
Saturation Ratios: 28 % (ref 17.9–39.5)
TIBC: 370 ug/dL (ref 250–450)
UIBC: 268 ug/dL

## 2024-08-27 LAB — FOLATE: Folate: >20 ng/mL (ref >5.9)

## 2024-08-27 LAB — VITAMIN B12: Vitamin B-12: 706 pg/mL (ref 180–914)

## 2024-08-27 LAB — FERRITIN: Ferritin: 42 ng/mL (ref 24–336)

## 2024-08-29 LAB — COPPER, SERUM: Copper: 45 ug/dL — ABNORMAL LOW (ref 69–132)

## 2024-09-02 LAB — METHYLMALONIC ACID, SERUM: Methylmalonic Acid, Quantitative: 176 nmol/L (ref 0–378)

## 2024-09-03 ENCOUNTER — Ambulatory Visit: Admitting: Family Medicine

## 2024-09-03 ENCOUNTER — Ambulatory Visit (INDEPENDENT_AMBULATORY_CARE_PROVIDER_SITE_OTHER)

## 2024-09-03 ENCOUNTER — Encounter: Payer: Self-pay | Admitting: Family Medicine

## 2024-09-03 VITALS — BP 105/67 | HR 76 | Temp 98.0°F | Ht 73.0 in | Wt 229.0 lb

## 2024-09-03 DIAGNOSIS — E1169 Type 2 diabetes mellitus with other specified complication: Secondary | ICD-10-CM | POA: Diagnosis not present

## 2024-09-03 DIAGNOSIS — Z0001 Encounter for general adult medical examination with abnormal findings: Secondary | ICD-10-CM

## 2024-09-03 DIAGNOSIS — E1159 Type 2 diabetes mellitus with other circulatory complications: Secondary | ICD-10-CM

## 2024-09-03 DIAGNOSIS — K580 Irritable bowel syndrome with diarrhea: Secondary | ICD-10-CM

## 2024-09-03 DIAGNOSIS — M1711 Unilateral primary osteoarthritis, right knee: Secondary | ICD-10-CM | POA: Diagnosis not present

## 2024-09-03 DIAGNOSIS — E291 Testicular hypofunction: Secondary | ICD-10-CM | POA: Diagnosis not present

## 2024-09-03 DIAGNOSIS — Z0000 Encounter for general adult medical examination without abnormal findings: Secondary | ICD-10-CM | POA: Diagnosis not present

## 2024-09-03 DIAGNOSIS — K55069 Acute infarction of intestine, part and extent unspecified: Secondary | ICD-10-CM

## 2024-09-03 DIAGNOSIS — M25561 Pain in right knee: Secondary | ICD-10-CM

## 2024-09-03 DIAGNOSIS — I152 Hypertension secondary to endocrine disorders: Secondary | ICD-10-CM

## 2024-09-03 DIAGNOSIS — E039 Hypothyroidism, unspecified: Secondary | ICD-10-CM

## 2024-09-03 DIAGNOSIS — D751 Secondary polycythemia: Secondary | ICD-10-CM

## 2024-09-03 DIAGNOSIS — E119 Type 2 diabetes mellitus without complications: Secondary | ICD-10-CM

## 2024-09-03 DIAGNOSIS — E785 Hyperlipidemia, unspecified: Secondary | ICD-10-CM | POA: Diagnosis not present

## 2024-09-03 LAB — URINALYSIS
Bilirubin, UA: NEGATIVE
Glucose, UA: NEGATIVE
Ketones, UA: NEGATIVE
Leukocytes,UA: NEGATIVE
Nitrite, UA: NEGATIVE
Protein,UA: NEGATIVE
RBC, UA: NEGATIVE
Specific Gravity, UA: 1.025 (ref 1.005–1.030)
Urobilinogen, Ur: 1 mg/dL (ref 0.2–1.0)
pH, UA: 5.5 (ref 5.0–7.5)

## 2024-09-03 MED ORDER — COLESTIPOL HCL 5 G PO GRAN: 5.0000 g | GRANULES | Freq: Two times a day (BID) | ORAL | 12 refills | Status: DC

## 2024-09-03 NOTE — Patient Instructions (Signed)
 Knee Exercises Ask your health care provider which exercises are safe for you. Do exercises exactly as told by your health care provider and adjust them as directed. It is normal to feel mild stretching, pulling, tightness, or discomfort as you do these exercises. Stop right away if you feel sudden pain or your pain gets worse. Do not begin these exercises until told by your health care provider. Stretching and range-of-motion exercises These exercises warm up your muscles and joints and improve the movement and flexibility of your knee. These exercises also help to relieve pain and swelling. Knee extension, prone  Lie on your abdomen (prone position) on a bed. Place your left / right knee just beyond the edge of the surface so your knee is not on the bed. You can put a towel under your left / right thigh just above your kneecap for comfort. Relax your leg muscles and allow gravity to straighten your knee (extension). You should feel a stretch behind your left / right knee. Hold this position for __________ seconds. Scoot up so your knee is supported between repetitions. Repeat __________ times. Complete this exercise __________ times a day. Knee flexion, active  Lie on your back with both legs straight. If this causes back discomfort, bend your left / right knee so your foot is flat on the floor. Slowly slide your left / right heel back toward your buttocks. Stop when you feel a gentle stretch in the front of your knee or thigh (flexion). Hold this position for _____5-10_____ seconds. Slowly slide your left / right heel back to the starting position. Repeat ________3-5__ times. Complete this exercise _______2___ times a day. Quadriceps stretch, prone  Lie on your abdomen on a firm surface, such as a bed or padded floor. Bend your left / right knee and hold your ankle. If you cannot reach your ankle or pant leg, loop a belt around your foot and grab the belt instead. Gently pull your heel  toward your buttocks. Your knee should not slide out to the side. You should feel a stretch in the front of your thigh and knee (quadriceps). Hold this position for ______10____ seconds. Repeat _______3-5 ___ times. Complete this exercise ______2____ times a day. Hamstring, supine  Lie on your back (supine position). Loop a belt or towel over the ball of your left / right foot. The ball of your foot is on the walking surface, right under your toes. Straighten your left / right knee and slowly pull on the belt to raise your leg until you feel a gentle stretch behind your knee (hamstring). Do not let your knee bend while you do this. Keep your other leg flat on the floor. Hold this position for __________ seconds. Repeat __________ times. Complete this exercise __________ times a day. Strengthening exercises These exercises build strength and endurance in your knee. Endurance is the ability to use your muscles for a long time, even after they get tired. Quadriceps, isometric This exercise strengthens the muscles in front of your thigh (quadriceps) without moving your knee joint (isometric). Lie on your back with your left / right leg extended and your other knee bent. Put a rolled towel or small pillow under your knee if told by your health care provider. Slowly tense the muscles in the front of your left / right thigh. You should see your kneecap slide up toward your hip or see increased dimpling just above the knee. This motion will push the back of the knee toward the  floor. For __________ seconds, hold the muscle as tight as you can without increasing your pain. Relax the muscles slowly and completely. Repeat __________ times. Complete this exercise __________ times a day. Straight leg raises This exercise strengthens the muscles in front of your thigh (quadriceps) and the muscles that move your hips (hip flexors). Lie on your back with your left / right leg extended and your other knee  bent. Tense the muscles in the front of your left / right thigh. You should see your kneecap slide up or see increased dimpling just above the knee. Your thigh may even shake a bit. Keep these muscles tight as you raise your leg 4-6 inches (10-15 cm) off the floor. Do not let your knee bend. Hold this position for __________ seconds. Keep these muscles tense as you lower your leg. Relax your muscles slowly and completely after each repetition. Repeat __________ times. Complete this exercise __________ times a day. Hamstring, isometric  Lie on your back on a firm surface. Bend your left / right knee about __________ degrees. Dig your left / right heel into the surface as if you are trying to pull it toward your buttocks. Tighten the muscles in the back of your thighs (hamstring) to dig as hard as you can without increasing any pain. Hold this position for __________ seconds. Release the tension gradually and allow your muscles to relax completely for __________ seconds after each repetition. Repeat __________ times. Complete this exercise __________ times a day. Hamstring curls If told by your health care provider, do this exercise while wearing ankle weights. Begin with __________lb / kg weights. Then increase the weight by 1 lb (0.5 kg) increments. Do not wear ankle weights that are more than __________lb / kg. Lie on your abdomen with your legs straight. Bend your left / right knee as far as you can without feeling pain. Keep your hips flat against the floor. Hold this position for __________ seconds. Slowly lower your leg to the starting position. Repeat __________ times. Complete this exercise __________ times a day. Squats This exercise strengthens the muscles in front of your thigh and knee (quadriceps). Stand in front of a table, with your feet and knees pointing straight ahead. You may rest your hands on the table for balance but not for support. Slowly bend your knees and lower  your hips like you are going to sit in a chair. Keep your weight over your heels, not over your toes. Keep your lower legs upright so they are parallel with the table legs. Do not let your hips go lower than your knees. Do not bend lower than told by your health care provider. If your knee pain increases, do not bend as low. Hold the squat position for __________ seconds. Slowly push with your legs to return to standing. Do not use your hands to pull yourself to standing. Repeat __________ times. Complete this exercise __________ times a day. Wall slides This exercise strengthens the muscles in front of your thigh and knee (quadriceps). Lean your back against a smooth wall or door, and walk your feet out 18-24 inches (46-61 cm) from it. Place your feet hip-width apart. Slowly slide down the wall or door until your knees bend __________ degrees. Keep your knees over your heels, not over your toes. Keep your knees in line with your hips. Hold this position for __________ seconds. Repeat __________ times. Complete this exercise __________ times a day. Straight leg raises, side-lying This exercise strengthens the muscles that  rotate the leg at the hip and move it away from your body (hip abductors). Lie on your side with your left / right leg in the top position. Lie so your head, shoulder, knee, and hip line up. You may bend your bottom knee to help you keep your balance. Roll your hips slightly forward so your hips are stacked directly over each other and your left / right knee is facing forward. Leading with your heel, lift your top leg 4-6 inches (10-15 cm). You should feel the muscles in your outer hip lifting. Do not let your foot drift forward. Do not let your knee roll toward the ceiling. Hold this position for __________ seconds. Slowly return your leg to the starting position. Let your muscles relax completely after each repetition. Repeat __________ times. Complete this exercise  __________ times a day. Straight leg raises, prone This exercise stretches the muscles that move your hips away from the front of the pelvis (hip extensors). Lie on your abdomen on a firm surface. You can put a pillow under your hips if that is more comfortable. Tense the muscles in your buttocks and lift your left / right leg about 4-6 inches (10-15 cm). Keep your knee straight as you lift your leg. Hold this position for __________ seconds. Slowly lower your leg to the starting position. Let your leg relax completely after each repetition. Repeat __________ times. Complete this exercise __________ times a day. This information is not intended to replace advice given to you by your health care provider. Make sure you discuss any questions you have with your health care provider. Document Revised: 08/16/2021 Document Reviewed: 08/16/2021 Elsevier Patient Education  2024 [employer].

## 2024-09-03 NOTE — Progress Notes (Signed)
 Subjective:  Patient ID: James [name], male    DOB: 10/16/1966  Age: 58 y.o. MRN: [id number]  CC: Annual Exam ([facility] documentation)   HPI  Discussed the use of AI scribe software for clinical note transcription with the patient, who gave verbal consent to proceed.  History of Present Illness James [name] is a 58 year old male who presents for an annual physical exam.  He has a history of hypertension, hypothyroidism, type 2 diabetes, and irritable bowel syndrome. He is overweight with a BMI of 30.21 and is currently taking testosterone  shots weekly, Xarelto  daily, and fenofibrate  for cholesterol management.  He recently had blood work done at the cancer center, which showed a white blood cell count that was lower than his previous value, and a hemoglobin level of 16.  He has experienced significant weight loss following surgery, previously weighing 289 pounds and now weighing around 229 pounds. He experiences bowel urgency and loose stools several times a day since his surgery and needs to be aware of bathroom locations when out.  He reports right knee pain with cracking and discomfort, especially upon waking. He walks frequently at work and uses a treadmill. He reports that his knee has not given out, but it has come close at times.  He has a history of sleep apnea and uses a CPAP machine. He is unsure of the last time he had a sleep study, noting it may have been over ten years ago.  No issues with urination, though he has reduced fluid intake before bed to avoid nocturia.          02/26/2024   10:13 AM 01/03/2024    9:15 AM 08/28/2023    9:32 AM  Depression screen PHQ 2/9  Decreased Interest 0 1 0  Down, Depressed, Hopeless 0 0 0  PHQ - 2 Score 0 1 0  Altered sleeping 0    Tired, decreased energy 0    Change in appetite 0    Feeling bad or failure about yourself  0    Trouble concentrating 0    Moving slowly or fidgety/restless 0    Suicidal thoughts 0    PHQ-9 Score  0    Difficult doing work/chores Not difficult at all      History Mary has a past medical history of Arthritis, Diverticula of colon, exploratory laparotomy, Hyperlipidemia, Hypertension, Hypothyroidism, Pre-diabetes, and Sleep apnea.   He has a past surgical history that includes Back surgery; Tonsillectomy; Incisional hernia repair (N/A, 11/23/2021); Colonoscopy with propofol  (N/A, 11/22/2021); Esophagogastroduodenoscopy (egd) with propofol  (N/A, 11/22/2021); polypectomy (11/22/2021); biopsy (11/22/2021); Esophagogastroduodenoscopy (egd) with propofol  (N/A, 02/06/2023); Colonoscopy with propofol  (N/A, 12/25/2023); and polypectomy (12/25/2023).   His family history includes Alzheimer's disease in his father; COPD in his mother; Stomach cancer in his maternal grandmother.He reports that he has never smoked. He has never been exposed to tobacco smoke. He has never used smokeless tobacco. He reports that he does not drink alcohol and does not use drugs.    ROS Review of Systems  Constitutional:  Negative for activity change, fatigue and unexpected weight change.  HENT:  Negative for congestion, ear pain, hearing loss, postnasal drip and trouble swallowing.   Eyes:  Negative for pain and visual disturbance.  Respiratory:  Negative for cough, chest tightness and shortness of breath.   Cardiovascular:  Negative for chest pain, palpitations and leg swelling.  Gastrointestinal:  Negative for abdominal distention, abdominal pain, blood in stool, constipation, diarrhea, nausea and vomiting.  Endocrine: Negative for cold intolerance, heat intolerance and polydipsia.  Genitourinary:  Negative for difficulty urinating, dysuria, flank pain, frequency and urgency.  Musculoskeletal:  Negative for arthralgias and joint swelling.  Skin:  Negative for color change, rash and wound.  Neurological:  Negative for dizziness, syncope, speech difficulty, weakness, light-headedness, numbness and headaches.  Hematological:   Does not bruise/bleed easily.  Psychiatric/Behavioral:  Negative for confusion, decreased concentration, dysphoric mood and sleep disturbance. The patient is not nervous/anxious.     Objective:  BP 105/67   Pulse 76   Temp 98 F (36.7 C)   Ht 6' 1 (1.854 m)   Wt 229 lb (103.9 kg)   SpO2 96%   BMI 30.21 kg/m   BP Readings from Last 3 Encounters:  09/03/24 105/67  07/07/24 125/70  06/03/24 112/71    Wt Readings from Last 3 Encounters:  09/03/24 229 lb (103.9 kg)  07/07/24 233 lb 3.2 oz (105.8 kg)  06/03/24 239 lb 1.6 oz (108.5 kg)     Physical Exam Constitutional:      Appearance: He is well-developed.  HENT:     Head: Normocephalic and atraumatic.  Eyes:     Pupils: Pupils are equal, round, and reactive to light.  Neck:     Thyroid : No thyromegaly.     Trachea: No tracheal deviation.  Cardiovascular:     Rate and Rhythm: Normal rate and regular rhythm.     Heart sounds: Normal heart sounds. No murmur heard.    No friction rub. No gallop.  Pulmonary:     Breath sounds: Normal breath sounds. No wheezing or rales.  Abdominal:     General: Bowel sounds are normal. There is no distension.     Palpations: Abdomen is soft. There is no mass.     Tenderness: There is no abdominal tenderness.     Hernia: There is no hernia in the left inguinal area.  Genitourinary:    Penis: Normal.      Testes: Normal.  Musculoskeletal:        General: Normal range of motion.     Cervical back: Normal range of motion.  Lymphadenopathy:     Cervical: No cervical adenopathy.  Skin:    General: Skin is warm and dry.  Neurological:     Mental Status: He is alert and oriented to person, place, and time.      Assessment & Plan:  Well adult exam -     Urinalysis  Acquired hypothyroidism -     TSH + free T4  Type 2 diabetes mellitus without complication, without long-term current use of insulin (HCC)  Hypertension associated with diabetes (HCC) -      Urinalysis  Hyperlipidemia associated with type 2 diabetes mellitus (HCC) -     Lipid panel  Secondary polycythemia  Mesenteric thrombosis (HCC)  Irritable bowel syndrome with diarrhea  Hypogonadism in male -     PSA, total and free  Pain, joint, knee, right -     DG Knee 1-2 Views Right; Future  Other orders -     Colestipol  HCl; Take 5 g by mouth 2 (two) times daily.  Dispense: 500 g; Refill: 12    Assessment and Plan Assessment & Plan Adult Wellness Visit   An annual physical examination was conducted with normal lab results for white blood cell count, hemoglobin, and liver function tests. Alkaline phosphatase is low but not concerning. Methylmalonic acid and B12 levels are normal. Emphasized the importance of maintaining a  healthy weight and waist circumference to reduce cardiovascular risk. Perform blood tests for cholesterol, thyroid , and A1c. Encourage weight loss and waist circumference reduction through exercise and diet, including core strengthening exercises like planks and crunches.  Overweight/obesity   BMI is 30.21, indicating overweight to mildly obese status. Noted weight loss since surgery. Further weight loss and waist circumference reduction are encouraged to improve cardiovascular health. Recommend diet and exercise, including planks and crunches for core strengthening.  Chronic right knee pain   Chronic right knee pain with occasional cracking and near-giving out, though no instability was detected on examination. Tylenol  is used for pain management. Recommend knee exercises focusing on quadriceps and hamstrings. Order an x-ray of the right knee. Advise continued use of Tylenol  for pain management and caution against frequent ibuprofen use due to blood thinner use.  Chronic diarrhea with bowel urgency, likely secondary to prior bowel surgery and vascular disease   Chronic diarrhea with bowel urgency occurs several times a day, likely related to past bowel  surgery and vascular issues. Prescribe colestipol  5 grams powder, 10 grams daily, to manage symptoms.  Hypertension   No specific discussion of current blood pressure readings or management adjustments. Continue current management plan.  Hypothyroidism   Thyroid  function not assessed in recent labs. Order thyroid  function tests.  Type 2 diabetes mellitus, not currently active   Diabetes is well-controlled and considered inactive. Order A1c test.  Testosterone  deficiency, on replacement therapy   Testosterone  replacement therapy continues with weekly injections. Hemoglobin levels are monitored to avoid excessive elevation. Discussed potential side effects, including increased clotting risk, mitigated by concurrent Xarelto  use. Continue weekly testosterone  injections and monitor hemoglobin levels to ensure he does not exceed 18. Continue Xarelto  for clot prevention.       Follow-up: Return in about 6 months (around 03/03/2025) for Hypothyroidism.  Butler Der, M.D.

## 2024-09-04 LAB — PSA, TOTAL AND FREE
PSA, Free Pct: 44 %
PSA, Free: 0.22 ng/mL
Prostate Specific Ag, Serum: 0.5 ng/mL (ref 0.0–4.0)

## 2024-09-04 LAB — LIPID PANEL
Chol/HDL Ratio: 3.7 ratio (ref 0.0–5.0)
Cholesterol, Total: 100 mg/dL (ref 100–199)
HDL: 27 mg/dL — ABNORMAL LOW (ref >39)
LDL Chol Calc (NIH): 48 mg/dL (ref 0–99)
Triglycerides: 145 mg/dL (ref 0–149)
VLDL Cholesterol Cal: 25 mg/dL (ref 5–40)

## 2024-09-04 LAB — TSH+FREE T4
Free T4: 1.09 ng/dL (ref 0.82–1.77)
TSH: 2.86 u[IU]/mL (ref 0.450–4.500)

## 2024-09-09 NOTE — Progress Notes (Unsigned)
 [facility] [address], [place] [address]   CLINIC:  Medical Oncology/Hematology  PCP:  Zollie Lowers, MD [address] [place] [address] [phone]   REASON FOR VISIT: Splanchnic vein thrombosis and iron deficiency anemia  CURRENT THERAPY: Xarelto  + iron tablet  INTERVAL HISTORY:   [name] 58 y.o. male returns for routine follow-up of iron deficiency anemia and splanchnic vein thrombosis.  He was last seen by Pleasant Barefoot PA-C on 02/27/2024.  At today's visit, he reports feeling fairly well apart from ongoing issues with his IBS. He reports that he stopped taking his iron tablet about 6 months ago. He has continued taking vitamin B12 1000 mcg daily and copper  2 mg daily. He denies any ice pica, headaches, or lightheadedness. He denies any increased fatigue.  He remains on Xarelto  for history of splanchnic vein thrombosis.   He has occasional hemorrhoid bleeding, which is not severe.  He reports loose stools due to IBS, occasionally dark. No abdominal pain, nausea, or vomiting.  He reports 80% energy and 75% appetite.  He has been maintaining a stable weight since his last visit.  ASSESSMENT & PLAN:  1.   Iron deficiency anemia - Developed iron deficiency anemia in November 2022, again in May 2023. - EGD (11/22/2021): Very subtle grade 1 varices found in lower third of esophagus, small in size - Colonoscopy (11/22/2021): Polyps x 10 throughout colon removed - Repeat colonoscopy (12/25/2023): Polyps x 5 (tubular adenoma), diverticulosis, nonbleeding internal hemorrhoids.  GI planning for repeat colonoscopy in 3 years. - EGD (02/06/2023): Grade 1 esophageal varices, 1 cm hiatal hernia, portal hypertensive gastropathy - His previous hematologist (Dr. Darcy of [facility]) recommended IV iron therapy, but this was not covered by insurance therefore patient declines. - He was taking ferrous sulfate 325 mg daily, but stopped 6 months ago - Most recent labs  (08/27/2024): Hgb 16.4/MCV 93.8.  Ferritin 42, iron saturation 28% with normal TIBC.  Creatinine 1.52/GFR 53 (developing CKD stage IIIa) - Intermittent black bowel movements, possibly from iron.  Occasional mild hemorrhoid bleeding. - Likely has some underlying malabsorption due to small bowel resection in June 2022 - PLAN: Recurrent iron deficiency occurred after stopping iron tablet.  Recommend patient to RESTART ferrous sulfate 325 mg daily.  Recheck CBC with iron panel in 6 months followed by OFFICE visit   2.  Mild thrombocytopenia, with copper  deficiency - Mild thrombocytopenia since November 2022, preceded by diagnosis of splanchnic vein thrombosis in June 2022 - [facility] screen from [facility] hematology was negative in July 2022 - No associated anemia or white cell count abnormalities to suggest primary bone marrow problem such as myelofibrosis - CTAP (10/07/2021): Spleen size normal - CTA abdomen/pelvis (12/09/2023): Splenomegaly with craniocaudal length 17.6 cm.  Persistent portal vein occlusion. - Prior testing was negative for hepatitis C and HIV. - Additional labs (05/21/2023): Hepatitis B negative. Normal ANA and rheumatoid factor. Immature platelet fraction normal at 7.2%. SPEP normal.  Minimally elevated kappa free light chain 21.7, normal lambda 19.4, normal ratio 1.12. COPPER  DEFICIENCY (serum copper  low at 54).  Normal B12, MMA.  Normal folate. - Patient taking vitamin B12 1000 mcg daily + copper  2 mg daily - He denies any abnormal bleeding or bruising - Most recent labs (08/27/2024):  Platelets 102. Normal B12, folate, MMA. Copper  is low at 45 - DIFFERENTIAL DIAGNOSIS favors thrombocytopenia secondary to copper  deficiency.  Suspect that he has malabsorption of copper  in the setting of prior small bowel  resection.  Thrombocytopenia likely secondary to splenomegaly caused by chronic portal vein occlusion/splanchnic vein thrombosis.   - PLAN: Recommend the patient INCREASE copper   supplementation to take 4 mg daily - Labs in 6 months to repeat copper  and CBC/D.  (Recheck B12 and folate annually, next due September 2026)  3.  Splanchnic vein thrombosis - Seen at the request of Dr. Darcy (benign hematology) at [facility] - Diagnosis preceded by 2 months of diarrhea and 2 weeks of abdominal pain - Diagnosed 06/13/2021: Thrombosis of SMV, several jejunal veins, splenic vein, main portal vein, central right and left intrahepatic portal veins - VTE risk factors: Testosterone  therapy x 10 years, with secondary erythrocytosis at the time of presentation (Hgb 17.3 on 05/27/2021) Preceding diverticulitis  COVID about 6 months prior to blood clot - Small bowel resection, approximately 55 cm (06/14/2021) - IR intervention with portal vein thrombectomy and angioplasty on 06/15/2021 - Treated with >6 months of Xarelto  20 mg daily, switched to prophylactic dose (Xarelto  10 mg daily) in May 2023 - JAK2 V617F testing negative (July 2022).  Antiphospholipid antibody testing triple negative. - Patient expressed desire to come off of Xarelto , but this was advised against. [facility] hematology (Dr. Darcy) favored continued prophylactic anticoagulant, especially since patient is back on testosterone  treatment now. - Per Dr. Rogers, indefinite anticoagulation favored due to unprovoked extensive splanchnic vein thrombosis with etiology unclear. - CT angio abdomen/pelvis (12/09/2023): Chronic portal vein occlusion with cavernous transformation of the portal vein. - PLAN: Continue Xarelto  10 mg daily indefinitely  4.  Lymphadenopathy (right axillary), RESOLVED - Subcentimeter lymph node in the right axillary region noted by Dr. Rogers on 02/02/2023 - No palpable lymphadenopathy on exam today   5.  Hypogonadism, on testosterone  supplementation - Testosterone  supplementation was stopped after he was diagnosed with splanchnic vein thrombosis - He restarted in May 2023, currently taking testosterone  100  mg every 7 days - PLAN: Continue management as per Dr. Zollie (PCP)   6.  Other history - He is retired Emergency planning/management officer, and now works as a Product/process development scientist.  Non-smoker. - No family history of DVT/PE. - Maternal grandmother had stomach cancer.  PLAN SUMMARY: >> Labs in 6 months = CBC/D, CMP, ferritin, iron/TIBC, copper  >> OFFICE visit in 6 months (2 weeks after labs)     REVIEW OF SYSTEMS:  Review of Systems  Constitutional:  Negative for appetite change, chills, diaphoresis, fatigue, fever and unexpected weight change.  HENT:   Negative for lump/mass and nosebleeds.   Eyes:  Negative for eye problems.  Respiratory:  Negative for cough, hemoptysis and shortness of breath.   Cardiovascular:  Negative for chest pain, leg swelling and palpitations.  Gastrointestinal:  Positive for constipation and diarrhea. Negative for abdominal pain, blood in stool, nausea and vomiting.  Genitourinary:  Negative for hematuria.   Musculoskeletal:  Positive for arthralgias.  Skin: Negative.   Neurological:  Negative for dizziness, headaches and light-headedness.  Hematological:  Does not bruise/bleed easily.     PHYSICAL EXAM:   ECOG PERFORMANCE STATUS: 0 - Asymptomatic  Vitals:   09/10/24 0855  BP: 127/83  Pulse: 85  Resp: 18  Temp: 98.3 F (36.8 C)  SpO2: 100%    Filed Weights   09/10/24 0855  Weight: 231 lb 4.2 oz (104.9 kg)    Physical Exam Constitutional:      Appearance: Normal appearance. He is obese.  Cardiovascular:     Heart sounds: Normal heart sounds.  Pulmonary:  Breath sounds: Normal breath sounds.  Lymphadenopathy:     Cervical: No cervical adenopathy.     Upper Body:     Right upper body: No axillary adenopathy.     Left upper body: No axillary adenopathy.  Neurological:     General: No focal deficit present.     Mental Status: Mental status is at baseline.  Psychiatric:        Behavior: Behavior normal. Behavior is cooperative.    PAST  MEDICAL/SURGICAL HISTORY:  Past Medical History:  Diagnosis Date   Arthritis    Diverticula of colon    Hx of exploratory laparotomy    enterectomy small intest. resection,celiotomy   Hyperlipidemia    Hypertension    Hypothyroidism    Pre-diabetes    Sleep apnea    Past Surgical History:  Procedure Laterality Date   BACK SURGERY     BIOPSY  11/22/2021   Procedure: BIOPSY;  Surgeon: Eartha Angelia Sieving, MD;  [facility];  Service: Gastroenterology;;   COLONOSCOPY WITH PROPOFOL  N/A 11/22/2021   Procedure: COLONOSCOPY WITH PROPOFOL ;  Surgeon: Eartha Angelia Sieving, MD;  [facility]: [facility];  Service: Gastroenterology;  Laterality: N/A;  8:45   COLONOSCOPY WITH PROPOFOL  N/A 12/25/2023   Procedure: COLONOSCOPY WITH PROPOFOL ;  Surgeon: Eartha Angelia Sieving, MD;  [facility]: [facility];  Service: Gastroenterology;  Laterality: N/A;  8:15am;asa 1   ESOPHAGOGASTRODUODENOSCOPY (EGD) WITH PROPOFOL  N/A 11/22/2021   Procedure: ESOPHAGOGASTRODUODENOSCOPY (EGD) WITH PROPOFOL ;  Surgeon: Eartha Angelia Sieving, MD;  [facility]: [facility];  Service: Gastroenterology;  Laterality: N/A;   ESOPHAGOGASTRODUODENOSCOPY (EGD) WITH PROPOFOL  N/A 02/06/2023   Procedure: ESOPHAGOGASTRODUODENOSCOPY (EGD) WITH PROPOFOL ;  Surgeon: Eartha Angelia Sieving, MD;  [facility]: [facility];  Service: Gastroenterology;  Laterality: N/A;  2:15 pm   INCISIONAL HERNIA REPAIR N/A 11/23/2021   Procedure: OPEN COMPONENT SEPARATION AND MESH ABDOMINAL WALL RECONSTRUCTION FOR INCISIONAL HERNIA REPAIR, LYSIS OF ADHESIONS AND TRANSABDOMINAL PLANE BLOCK;  Surgeon: Sheldon Standing, MD;  Location: [facility];  Service: General;  Laterality: N/A;   POLYPECTOMY  11/22/2021   Procedure: POLYPECTOMY;  Surgeon: Eartha Angelia Sieving, MD;  [facility];  Service: Gastroenterology;;   POLYPECTOMY  12/25/2023   Procedure: POLYPECTOMY;  Surgeon: Eartha Angelia Sieving, MD;  [facility]: [facility];   Service: Gastroenterology;;   TONSILLECTOMY      SOCIAL HISTORY:  Social History   Socioeconomic History   Marital status: Married    Spouse name: Not on file   Number of children: Not on file   Years of education: Not on file   Highest education level: 12th grade  Occupational History   Not on file  Tobacco Use   Smoking status: Never    Passive exposure: Never   Smokeless tobacco: Never  Vaping Use   Vaping status: Never Used  Substance and Sexual Activity   Alcohol use: No    Comment: rare   Drug use: No   Sexual activity: Yes    Birth control/protection: None  Other Topics Concern   Not on file  Social History Narrative   He is currently a Lawyer.    Social Drivers of Health   Financial Resource Strain: Patient Declined (08/31/2024)   Overall Financial Resource Strain (CARDIA)    Difficulty of Paying Living Expenses: Patient declined  Food Insecurity: Patient Declined (08/31/2024)   Hunger Vital Sign    Worried About Running Out of Food in the Last Year: Patient declined    Ran  Out of Food in the Last Year: Patient declined  Transportation Needs: Unknown (08/31/2024)   PRAPARE - Administrator, Civil Service (Medical): Patient declined    Lack of Transportation (Non-Medical): Not on file  Physical Activity: Sufficiently Active (08/31/2024)   Exercise Vital Sign    Days of Exercise per Week: 4 days    Minutes of Exercise per Session: 50 min  Stress: Patient Declined (08/31/2024)   [employer] of Occupational Health - Occupational Stress Questionnaire    Feeling of Stress: Patient declined  Social Connections: Socially Integrated (08/31/2024)   Social Connection and Isolation Panel    Frequency of Communication with Friends and Family: More than three times a week    Frequency of Social Gatherings with Friends and Family: Twice a week    Attends Religious Services: More than 4 times per year    Active Member of [employer] or Organizations: Yes     Attends Engineer, structural: More than 4 times per year    Marital Status: Married  Catering manager Violence: Not At Risk (02/02/2023)   Humiliation, Afraid, Rape, and Kick questionnaire    Fear of Current or Ex-Partner: No    Emotionally Abused: No    Physically Abused: No    Sexually Abused: No    FAMILY HISTORY:  Family History  Problem Relation Age of Onset   COPD Mother    Alzheimer's disease Father    Stomach cancer Maternal Grandmother     CURRENT MEDICATIONS:  Outpatient Encounter Medications as of 09/10/2024  Medication Sig   acidophilus (RISAQUAD) CAPS capsule Take 1 capsule by mouth daily.   aspirin  EC 81 MG tablet Take 81 mg by mouth daily. Swallow whole.   blood glucose meter kit and supplies Dispense based on patient and insurance preference. Use up to four times daily as directed. (FOR ICD-10 E10.9, E11.9).   carvedilol  (COREG ) 3.125 MG tablet TAKE 1 TABLET BY MOUTH TWICE DAILY WITH MEALS   colestipol  (COLESTID ) 5 g granules Take 5 g by mouth 2 (two) times daily.   dicyclomine  (BENTYL ) 10 MG capsule Take 1 capsule (10 mg total) by mouth 3 (three) times daily as needed for spasms.   docusate sodium  (COLACE) 100 MG capsule Take 1 capsule (100 mg total) by mouth 2 (two) times daily. To soften stool   fenofibrate  160 MG tablet TAKE ONE (1) TABLET EACH DAY   fluticasone  (FLONASE ) 50 MCG/ACT nasal spray USE 1 SPRAY IN BOTH NOSTRILS TWICE DAILYAS NEEDED FOR ALLERGIES OR RHINITIS   guaifenesin  (HUMIBID E) 400 MG TABS tablet Take 1 tablet (400 mg total) by mouth every 6 (six) hours as needed.   levothyroxine  (SYNTHROID ) 50 MCG tablet TAKE ONE (1) TABLET EACH DAY   Psyllium 30.9 % POWD Drink 1 tablespoon dissolved in water , twice Daily   sildenafil  (REVATIO ) 20 MG tablet Take 2-5 pills at once, orally, with each sexual encounter   testosterone  cypionate (DEPOTESTOSTERONE CYPIONATE) 200 MG/ML injection INJECT 0.5ML IM EVERY 7 DAYS   valsartan -hydrochlorothiazide   (DIOVAN -HCT) 320-25 MG tablet TAKE ONE TABLET BY MOUTH EACH DAY FOR BLOOD PRESSURE   XARELTO  10 MG TABS tablet TAKE 1 TABLET BY MOUTH EVERY DAY   No facility-administered encounter medications on file as of 09/10/2024.    ALLERGIES:  Allergies  Allergen Reactions   Metformin  And Related     Loose bloody stools    LABORATORY DATA:  I have reviewed the labs as listed.  CBC    Component Value  Date/Time   WBC 5.9 08/27/2024 0922   RBC 5.15 08/27/2024 0922   HGB 16.4 08/27/2024 0922   HGB 15.3 02/26/2024 1021   HCT 48.3 08/27/2024 0922   HCT 45.7 02/26/2024 1021   PLT 102 (L) 08/27/2024 0922   PLT 144 (L) 02/26/2024 1021   MCV 93.8 08/27/2024 0922   MCV 94 02/26/2024 1021   MCH 31.8 08/27/2024 0922   MCHC 34.0 08/27/2024 0922   RDW 14.1 08/27/2024 0922   RDW 13.2 02/26/2024 1021   LYMPHSABS 0.8 08/27/2024 0922   LYMPHSABS 1.0 02/26/2024 1021   MONOABS 0.5 08/27/2024 0922   EOSABS 0.3 08/27/2024 0922   EOSABS 0.2 02/26/2024 1021   BASOSABS 0.1 08/27/2024 0922   BASOSABS 0.1 02/26/2024 1021      Latest Ref Rng & Units 08/27/2024    9:22 AM 02/26/2024   10:21 AM 12/09/2023    1:34 AM  CMP  Glucose 70 - 99 mg/dL 95  869  888   BUN 6 - 20 mg/dL 21  13  19    Creatinine 0.61 - 1.24 mg/dL 8.47  8.61  8.64   Sodium 135 - 145 mmol/L 138  136  137   Potassium 3.5 - 5.1 mmol/L 4.3  4.0  3.7   Chloride 98 - 111 mmol/L 100  100  104   CO2 22 - 32 mmol/L 25  23  22    Calcium  8.9 - 10.3 mg/dL 9.5  9.9  9.6   Total Protein 6.5 - 8.1 g/dL 7.1  6.7  7.0   Total Bilirubin 0.0 - 1.2 mg/dL 1.5  1.2  2.0   Alkaline Phos 38 - 126 U/L 29  41  38   AST 15 - 41 U/L 29  35  45   ALT 0 - 44 U/L 33  33  58     DIAGNOSTIC IMAGING:  I have independently reviewed the relevant imaging and discussed with the patient.   WRAP UP:  All questions were answered. The patient knows to call the clinic with any problems, questions or concerns.  Medical decision making: Low  Time spent on visit: I  spent 15 minutes counseling the patient face to face. The total time spent in the appointment was 22 minutes and more than 50% was on counseling.  Pleasant CHRISTELLA Barefoot, PA-C  09/10/24 9:21 AM

## 2024-09-10 ENCOUNTER — Inpatient Hospital Stay (HOSPITAL_BASED_OUTPATIENT_CLINIC_OR_DEPARTMENT_OTHER): Admitting: Physician Assistant

## 2024-09-10 DIAGNOSIS — D500 Iron deficiency anemia secondary to blood loss (chronic): Secondary | ICD-10-CM

## 2024-09-10 DIAGNOSIS — E291 Testicular hypofunction: Secondary | ICD-10-CM | POA: Diagnosis not present

## 2024-09-10 DIAGNOSIS — D696 Thrombocytopenia, unspecified: Secondary | ICD-10-CM | POA: Diagnosis not present

## 2024-09-10 DIAGNOSIS — E610 Copper deficiency: Secondary | ICD-10-CM

## 2024-09-10 DIAGNOSIS — I81 Portal vein thrombosis: Secondary | ICD-10-CM

## 2024-09-10 DIAGNOSIS — K55069 Acute infarction of intestine, part and extent unspecified: Secondary | ICD-10-CM

## 2024-09-10 DIAGNOSIS — Z800 Family history of malignant neoplasm of digestive organs: Secondary | ICD-10-CM | POA: Diagnosis not present

## 2024-09-10 DIAGNOSIS — Z79890 Hormone replacement therapy (postmenopausal): Secondary | ICD-10-CM | POA: Diagnosis not present

## 2024-09-10 DIAGNOSIS — K589 Irritable bowel syndrome without diarrhea: Secondary | ICD-10-CM | POA: Diagnosis not present

## 2024-09-10 DIAGNOSIS — Z7982 Long term (current) use of aspirin: Secondary | ICD-10-CM | POA: Diagnosis not present

## 2024-09-10 DIAGNOSIS — Z86718 Personal history of other venous thrombosis and embolism: Secondary | ICD-10-CM | POA: Diagnosis not present

## 2024-09-10 DIAGNOSIS — Z79899 Other long term (current) drug therapy: Secondary | ICD-10-CM | POA: Diagnosis not present

## 2024-09-10 DIAGNOSIS — D509 Iron deficiency anemia, unspecified: Secondary | ICD-10-CM | POA: Diagnosis not present

## 2024-09-10 DIAGNOSIS — Z7901 Long term (current) use of anticoagulants: Secondary | ICD-10-CM | POA: Diagnosis not present

## 2024-09-10 NOTE — Patient Instructions (Signed)
 [facility] at [facility] **VISIT SUMMARY & IMPORTANT INSTRUCTIONS **   You were seen today by Pleasant Barefoot PA-C for your follow-up visit.    HISTORY OF BLOOD CLOTS Continue to take Xarelto  10 mg daily. You will need to be on a blood thinner indefinitely, since your blood clots were unprovoked.  LOW PLATELETS You have had mildly low platelets, which are stable. Your CT scan from December 2024 showed an enlarged spleen.  This is likely a result of the blood clot in your liver.  Having an enlarged spleen can cause your platelets to be low, since more of your platelets are sucked up by your spleen, the bigger it is.  VITAMIN & MINERAL DEFICIENCIES Please RESTART your iron tablet (ferrous sulfate 325 mg) every day, since your iron levels dropped after you stopped taking it. Please INCREASE your copper  to take 4 mg copper  supplement daily.  FOLLOW-UP APPOINTMENT: 6 months  ** Thank you for trusting me with your healthcare!  I strive to provide all of my patients with quality care at each visit.  If you receive a survey for this visit, I would be so grateful to you for taking the time to provide feedback.  Thank you in advance!  ~ Tara Wich                                        Dr. Mickiel Davonna Pleasant Barefoot, PA-C     Delon Hope, NP   - - - - - - - - - - - - - - - - - -   Thank you for choosing [facility] at [facility] to provide your oncology and hematology care.  To afford each patient quality time with our provider, please arrive at least 15 minutes before your scheduled appointment time.   If you have a lab appointment with the Cancer Center please come in thru the Main Entrance and check in at the main information desk.  You need to re-schedule your appointment should you arrive 10 or more minutes late.  We strive to give you quality time with our providers, and arriving late affects you and other patients whose  appointments are after yours.  Also, if you no show three or more times for appointments you may be dismissed from the clinic at the providers discretion.     Again, thank you for choosing [facility]  Our hope is that these requests will decrease the amount of time that you wait before being seen by our physicians.       _____________________________________________________________  Should you have questions after your visit to [facility], please contact our office at [phone] and follow the prompts.  Our office hours are 8:00 a.m. and 4:30 p.m. Monday - Friday.  Please note that voicemails left after 4:00 p.m. may not be returned until the following business day.  We are closed weekends and major holidays.  You do have access to a nurse 24-7, just call the main number to the clinic [phone] and do not press any options, hold on the line and a nurse will answer the phone.    For prescription refill requests, have your pharmacy contact our office and allow 72 hours.

## 2024-09-11 ENCOUNTER — Ambulatory Visit: Payer: Self-pay | Admitting: Family Medicine

## 2024-09-11 NOTE — Progress Notes (Signed)
Hello James [name],  Your lab result is normal and/or stable.Some minor variations that are not significant are commonly marked abnormal, but do not represent any medical problem for you.  Best regards, Zacari Stiff, M.D.

## 2024-09-24 ENCOUNTER — Other Ambulatory Visit (INDEPENDENT_AMBULATORY_CARE_PROVIDER_SITE_OTHER): Payer: Self-pay | Admitting: Gastroenterology

## 2024-09-24 DIAGNOSIS — I8500 Esophageal varices without bleeding: Secondary | ICD-10-CM

## 2024-09-24 DIAGNOSIS — I8510 Secondary esophageal varices without bleeding: Secondary | ICD-10-CM

## 2024-09-24 DIAGNOSIS — I81 Portal vein thrombosis: Secondary | ICD-10-CM

## 2024-09-24 DIAGNOSIS — I152 Hypertension secondary to endocrine disorders: Secondary | ICD-10-CM

## 2024-09-24 DIAGNOSIS — K580 Irritable bowel syndrome with diarrhea: Secondary | ICD-10-CM

## 2024-09-29 LAB — OPHTHALMOLOGY REPORT-SCANNED

## 2024-10-01 ENCOUNTER — Encounter (INDEPENDENT_AMBULATORY_CARE_PROVIDER_SITE_OTHER): Payer: Self-pay | Admitting: Gastroenterology

## 2024-10-20 ENCOUNTER — Other Ambulatory Visit (INDEPENDENT_AMBULATORY_CARE_PROVIDER_SITE_OTHER): Payer: Self-pay | Admitting: Gastroenterology

## 2024-10-20 DIAGNOSIS — K580 Irritable bowel syndrome with diarrhea: Secondary | ICD-10-CM

## 2024-10-20 DIAGNOSIS — I8500 Esophageal varices without bleeding: Secondary | ICD-10-CM

## 2024-10-20 DIAGNOSIS — E1159 Type 2 diabetes mellitus with other circulatory complications: Secondary | ICD-10-CM

## 2024-10-20 DIAGNOSIS — I8510 Secondary esophageal varices without bleeding: Secondary | ICD-10-CM

## 2024-10-20 DIAGNOSIS — I81 Portal vein thrombosis: Secondary | ICD-10-CM

## 2024-10-24 ENCOUNTER — Other Ambulatory Visit: Payer: Self-pay | Admitting: Family Medicine

## 2024-10-29 ENCOUNTER — Encounter (INDEPENDENT_AMBULATORY_CARE_PROVIDER_SITE_OTHER): Payer: Self-pay | Admitting: Gastroenterology

## 2024-10-29 ENCOUNTER — Other Ambulatory Visit: Payer: Self-pay | Admitting: Family Medicine

## 2024-10-29 DIAGNOSIS — E1169 Type 2 diabetes mellitus with other specified complication: Secondary | ICD-10-CM

## 2024-11-27 ENCOUNTER — Encounter (INDEPENDENT_AMBULATORY_CARE_PROVIDER_SITE_OTHER): Payer: Self-pay | Admitting: Gastroenterology

## 2024-11-27 ENCOUNTER — Ambulatory Visit (INDEPENDENT_AMBULATORY_CARE_PROVIDER_SITE_OTHER): Admitting: Gastroenterology

## 2024-11-27 VITALS — BP 124/79 | HR 76 | Temp 97.7°F | Ht 73.0 in | Wt 234.0 lb

## 2024-11-27 DIAGNOSIS — K580 Irritable bowel syndrome with diarrhea: Secondary | ICD-10-CM | POA: Diagnosis not present

## 2024-11-27 DIAGNOSIS — K766 Portal hypertension: Secondary | ICD-10-CM | POA: Diagnosis not present

## 2024-11-27 DIAGNOSIS — I81 Portal vein thrombosis: Secondary | ICD-10-CM | POA: Diagnosis not present

## 2024-11-27 DIAGNOSIS — I8500 Esophageal varices without bleeding: Secondary | ICD-10-CM

## 2024-11-27 DIAGNOSIS — Z7901 Long term (current) use of anticoagulants: Secondary | ICD-10-CM | POA: Diagnosis not present

## 2024-11-27 DIAGNOSIS — I8510 Secondary esophageal varices without bleeding: Secondary | ICD-10-CM

## 2024-11-27 DIAGNOSIS — K55069 Acute infarction of intestine, part and extent unspecified: Secondary | ICD-10-CM

## 2024-11-27 DIAGNOSIS — I152 Hypertension secondary to endocrine disorders: Secondary | ICD-10-CM

## 2024-11-27 MED ORDER — COLESTIPOL HCL 1 G PO TABS: 2.0000 g | ORAL_TABLET | Freq: Every day | ORAL | 3 refills | Status: AC

## 2024-11-27 MED ORDER — CARVEDILOL 3.125 MG PO TABS: 3.1250 mg | ORAL_TABLET | Freq: Two times a day (BID) | ORAL | 1 refills | Status: AC

## 2024-11-27 NOTE — Progress Notes (Signed)
 Toribio Fortune, M.D. Gastroenterology & Hepatology [facility] Gastroenterology [address] [place], [place] [address]  Primary Care Physician: Zollie Lowers, MD [address] [place] [address]  I will communicate my assessment and recommendations to the referring MD via EMR.  Problems: IBS Esophageal varices  Portal hypertension secondary to portal vein with thrombosis of SMV, jugular veins, splenic vein, and intrahepatic veins thrombosis on Xarelto   History of Present Illness: James [name] is a 58 y.o. 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 diff  who presents for follow up of IBS and esophageal varices.  The patient was last seen on 06/03/2024. At that time, the patient was advised to use a low FODMAP diet and to take Bentyl  as needed as well as IBgard/Iberogast.  He was advised to continue Coreg  3.125 mg twice a day.  Patient reports that his bowel movements fluctuate significantly, which are predominantly diarrhea-like. He reports that after he had the thrombosis and surgery, he has noticed these changes. He may have 5-6 bowel movements per day, but at the least is soft. Stools are watery to soft. However, he feels he has tenesmus despite having a BM sometimes. He may also have some urgency.  He has never tried colestipol , listed in meds but never filled it (it appears that this was filled by his PCP in the past).  Patient sometimes takes dicyclomine  as needed for abdominal pain. IBGard also helps to decrease fecal urgency.  The patient denies having any nausea, vomiting, fever, chills, hematochezia, melena, hematemesis, jaundice, pruritus.  He is concerned about weight gain and would like to lose some  weight.  Patient reports that he used to take Coreg  6.25 twice daily, but it was a high dose for him so it was discussed to decrease the dosage.  However, he reported that he tried taking this medication once a day 2 pills at the same time as he thought this had same effect.  Last Colonoscopy:12/2023- Five 2 to 8 mm polyps in the transverse colon, in                            the ascending colon and in the cecum, removed with                            a cold snare. Resected and retrieved.                           - Diverticulosis in the sigmoid colon, in the                            descending colon and in the transverse colon.                           - Non-bleeding internal hemorrhoids.                           - Anal papilla(e) were hypertrophied. . COLON, ASCENDING, TRANSVERSE, POLYPECTOMY:       Fragments of tubular adenoma.  Negative for high-grade dysplasia.  CT Angio A/P w wo: 12/09/23: VASCULAR Chronic portal vein occlusion with cavernous transformation of the portal vein as seen on prior study from 2022. Otherwise no evidence of arterial or venous occlusion/thrombosis. Scattered aortoiliac atherosclerosis.   NON-VASCULAR Left colonic diverticulosis. Haziness around the proximal descending colon may reflect early active diverticulitis. Splenomegaly, stable since prior study.   CT A/P with contrast: Oct 2022 interval development of cavernous transformation of the main portal vein secondary to thrombosis noted on prior CT scan of June 13, 2021 -Embolization coils noted within right hepatic lobe which was not identified on prior exam. -Large amount of stool seen in the sigmoid colon and rectum concerning for rectal impaction. -Sigmoid diverticulosis is noted without definite evidence of acute inflammation. -Mild broad necked fat containing periumbilical hernia is  abdominal wall construction for incisional hernia with mesh placement, Dr. Sheldon on 11/23/2021.     Last  Endoscopy:01/2023  - Grade I esophageal varices.                           - 1 cm hiatal hernia.                           - Portal hypertensive gastropathy.                           - Normal examined duodenum.                           - No specimens collected.   Recommendations:  Repeat colonoscopy 11/2027 Repeat EGD feb 2026  Past Medical History: Past Medical History:  Diagnosis Date   Arthritis    Diverticula of colon    Hx of exploratory laparotomy    enterectomy small intest. resection,celiotomy   Hyperlipidemia    Hypertension    Hypothyroidism    Pre-diabetes    Sleep apnea     Past Surgical History: Past Surgical History:  Procedure Laterality Date   BACK SURGERY     BIOPSY  11/22/2021   Procedure: BIOPSY;  Surgeon: Eartha Angelia Sieving, MD;  Location: [facility];  Service: Gastroenterology;;   COLONOSCOPY WITH PROPOFOL  N/A 11/22/2021   Procedure: COLONOSCOPY WITH PROPOFOL ;  Surgeon: Eartha Angelia Sieving, MD;  Location: [facility];  Service: Gastroenterology;  Laterality: N/A;  8:45   COLONOSCOPY WITH PROPOFOL  N/A 12/25/2023   Procedure: COLONOSCOPY WITH PROPOFOL ;  Surgeon: Eartha Angelia Sieving, MD;  Location: [facility];  Service: Gastroenterology;  Laterality: N/A;  8:15am;asa 1   ESOPHAGOGASTRODUODENOSCOPY (EGD) WITH PROPOFOL  N/A 11/22/2021   Procedure: ESOPHAGOGASTRODUODENOSCOPY (EGD) WITH PROPOFOL ;  Surgeon: Eartha Angelia Sieving, MD;  Location: [facility];  Service: Gastroenterology;  Laterality: N/A;   ESOPHAGOGASTRODUODENOSCOPY (EGD) WITH PROPOFOL  N/A 02/06/2023   Procedure: ESOPHAGOGASTRODUODENOSCOPY (EGD) WITH PROPOFOL ;  Surgeon: Eartha Angelia Sieving, MD;  Location: [facility];  Service: Gastroenterology;  Laterality: N/A;  2:15 pm   INCISIONAL HERNIA REPAIR N/A 11/23/2021   Procedure: OPEN COMPONENT SEPARATION AND MESH ABDOMINAL WALL RECONSTRUCTION FOR INCISIONAL HERNIA REPAIR, LYSIS OF ADHESIONS AND TRANSABDOMINAL PLANE  BLOCK;  Surgeon: Sheldon Standing, MD;  Location: [facility];  Service: General;  Laterality: N/A;   POLYPECTOMY  11/22/2021   Procedure: POLYPECTOMY;  Surgeon: Eartha Angelia Sieving, MD;  Location: [facility];  Service: Gastroenterology;;   POLYPECTOMY  12/25/2023  Procedure: POLYPECTOMY;  Surgeon: Eartha Flavors, Toribio, MD;  Location: [facility];  Service: Gastroenterology;;   TONSILLECTOMY      Family History: Family History  Problem Relation Age of Onset   COPD Mother    Alzheimer's disease Father    Stomach cancer Maternal Grandmother     Social History:Tobacco Use History[1] Social History   Substance and Sexual Activity  Alcohol Use No   Comment: rare   Social History   Substance and Sexual Activity  Drug Use No    Allergies: Allergies[2]  Medications: Current Outpatient Medications  Medication Sig Dispense Refill   acidophilus (RISAQUAD) CAPS capsule Take 1 capsule by mouth daily.     aspirin  EC 81 MG tablet Take 81 mg by mouth daily. Swallow whole.     blood glucose meter kit and supplies Dispense based on patient and insurance preference. Use up to four times daily as directed. (FOR ICD-10 E10.9, E11.9). 1 each 0   carvedilol  (COREG ) 3.125 MG tablet TAKE 1 TABLET BY MOUTH TWICE DAILY WITH MEALS 60 tablet 1   colestipol  (COLESTID ) 5 g granules Take 5 g by mouth 2 (two) times daily. 500 g 12   dicyclomine  (BENTYL ) 10 MG capsule Take 1 capsule (10 mg total) by mouth 3 (three) times daily as needed for spasms. (Patient taking differently: Take 10 mg by mouth daily at 6 (six) AM. As needed) 90 capsule 1   docusate sodium  (COLACE) 100 MG capsule Take 1 capsule (100 mg total) by mouth 2 (two) times daily. To soften stool 60 capsule 5   fenofibrate  160 MG tablet TAKE ONE (1) TABLET EACH DAY 90 tablet 1   fluticasone  (FLONASE ) 50 MCG/ACT nasal spray USE 1 SPRAY IN BOTH NOSTRILS TWICE DAILYAS NEEDED FOR ALLERGIES OR RHINITIS 16 g 5   levothyroxine  (SYNTHROID ) 50 MCG  tablet TAKE ONE (1) TABLET EACH DAY 90 tablet 3   Psyllium 30.9 % POWD Drink 1 tablespoon dissolved in water , twice Daily (Patient taking differently: As needed one to two times per week.)     sildenafil  (REVATIO ) 20 MG tablet Take 2-5 pills at once, orally, with each sexual encounter 50 tablet 5   testosterone  cypionate (DEPOTESTOSTERONE CYPIONATE) 200 MG/ML injection INJECT 0.5ML IM EVERY 7 DAYS 10 mL 1   valsartan -hydrochlorothiazide  (DIOVAN -HCT) 320-25 MG tablet TAKE ONE TABLET BY MOUTH EACH DAY FOR BLOOD PRESSURE 90 tablet 1   XARELTO  10 MG TABS tablet TAKE 1 TABLET BY MOUTH EVERY DAY 30 tablet 11   No current facility-administered medications for this visit.    Review of Systems: GENERAL: negative for malaise, night sweats HEENT: No changes in hearing or vision, no nose bleeds or other nasal problems. NECK: Negative for lumps, goiter, pain and significant neck swelling RESPIRATORY: Negative for cough, wheezing CARDIOVASCULAR: Negative for chest pain, leg swelling, palpitations, orthopnea GI: SEE HPI MUSCULOSKELETAL: Negative for joint pain or swelling, back pain, and muscle pain. SKIN: Negative for lesions, rash PSYCH: Negative for sleep disturbance, mood disorder and recent psychosocial stressors. HEMATOLOGY Negative for prolonged bleeding, bruising easily, and swollen nodes. ENDOCRINE: Negative for cold or heat intolerance, polyuria, polydipsia and goiter. NEURO: negative for tremor, gait imbalance, syncope and seizures. The remainder of the review of systems is noncontributory.   Physical Exam: BP 124/79 (BP Location: Left Arm, Patient Position: Sitting, Cuff Size: Large)   Pulse 76   Temp 97.7 F (36.5 C) (Oral)   Ht 6' 1 (1.854 m)   Wt 234 lb (106.1 kg)  BMI 30.87 kg/m  GENERAL: The patient is AO x3, in no acute distress. HEENT: Head is normocephalic and atraumatic. EOMI are intact. Mouth is well hydrated and without lesions. NECK: Supple. No masses LUNGS: Clear to  auscultation. No presence of rhonchi/wheezing/rales. Adequate chest expansion HEART: RRR, normal s1 and s2. ABDOMEN: Soft, nontender, no guarding, no peritoneal signs, and nondistended. BS +. No masses. EXTREMITIES: Without any cyanosis, clubbing, rash, lesions or edema. NEUROLOGIC: AOx3, no focal motor deficit. SKIN: no jaundice, no rashes  Imaging/Labs: as above  I personally reviewed and interpreted the available labs, imaging and endoscopic files.  Impression and Plan: James [name] is a 58 y.o. 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 diff  who presents for follow up of IBS and esophageal varices.  Patient has had recurrent issues with diarrhea and tenesmus/urgency after he underwent small bowel surgery.  Unfortunately, he has not completely improved despite taking different medication such as dicyclomine  and IBgard, which have provided some relief.  I suspect that part of his symptoms may be related to bile salt diarrhea.  For some unclear reason, he never started colestipol .  We discussed starting this medication on a regular basis 4 hours apart from his other medications (6 PM as he works third shift).  Patient is in agreement to start this.  He will also like to start a diet high in protein and vegetables to achieve some weight loss.  In terms of his portal hypertension and esophageal varices, the patient was incorrectly taking carvedilol .  He was supposed to take this twice a day but instead he has been taking it on a daily basis on his own.  I advised him to go back to 3.125 mg twice daily.  He understood and agreed.  He should continue with his anticoagulation.  -Restart taking carvedilol  3.125 mg twice a day -Start colestipol  2 g daily at 6  PM, 4 hours apart from other medications -Can try high-protein diet rich in vegetables, smaller portions through the day -Continue IBgard as needed for fecal urgency and abdominal discomfort -Continue Bentyl  as needed for abdominal pain  All questions were answered.      Toribio Fortune, MD Gastroenterology and Hepatology [facility] Gastroenterology     [1]  Social History Tobacco Use  Smoking Status Never   Passive exposure: Never  Smokeless Tobacco Never  [2]  Allergies Allergen Reactions   Metformin  And Related     Loose bloody stools

## 2024-11-27 NOTE — Patient Instructions (Addendum)
 Restart taking carvedilol  3.125 mg twice a day Start colestipol  2 g daily at 6 PM, 4 hours apart from other medications Can try high-protein diet rich in vegetables, smaller portions through the day Continue IBgard as needed for fecal urgency and abdominal discomfort Continue Bentyl  as needed for abdominal pain

## 2024-12-01 ENCOUNTER — Other Ambulatory Visit: Payer: Self-pay | Admitting: Family Medicine

## 2024-12-01 DIAGNOSIS — E039 Hypothyroidism, unspecified: Secondary | ICD-10-CM

## 2025-01-08 ENCOUNTER — Encounter (INDEPENDENT_AMBULATORY_CARE_PROVIDER_SITE_OTHER): Payer: Self-pay | Admitting: *Deleted

## 2025-02-24 ENCOUNTER — Inpatient Hospital Stay

## 2025-03-03 ENCOUNTER — Ambulatory Visit: Payer: Self-pay | Admitting: Family Medicine

## 2025-03-10 ENCOUNTER — Ambulatory Visit: Admitting: Physician Assistant
